# Patient Record
Sex: Female | Born: 1935 | Race: White | Hispanic: No
Health system: Southern US, Community
[De-identification: ages and names within clinical notes are randomized; demographics above are authoritative.]

## PROBLEM LIST (undated history)

## (undated) DIAGNOSIS — I509 Heart failure, unspecified: Secondary | ICD-10-CM

## (undated) DIAGNOSIS — F329 Major depressive disorder, single episode, unspecified: Secondary | ICD-10-CM

## (undated) DIAGNOSIS — N189 Chronic kidney disease, unspecified: Secondary | ICD-10-CM

## (undated) DIAGNOSIS — F32A Depression, unspecified: Secondary | ICD-10-CM

## (undated) DIAGNOSIS — M1990 Unspecified osteoarthritis, unspecified site: Secondary | ICD-10-CM

## (undated) DIAGNOSIS — I10 Essential (primary) hypertension: Secondary | ICD-10-CM

## (undated) DIAGNOSIS — E079 Disorder of thyroid, unspecified: Secondary | ICD-10-CM

## (undated) DIAGNOSIS — D649 Anemia, unspecified: Secondary | ICD-10-CM

## (undated) HISTORY — DX: Depression, unspecified: F32.A

## (undated) HISTORY — DX: Heart failure, unspecified: I50.9

## (undated) HISTORY — DX: Essential (primary) hypertension: I10

## (undated) HISTORY — DX: Anemia, unspecified: D64.9

## (undated) HISTORY — DX: Chronic kidney disease, unspecified: N18.9

## (undated) HISTORY — DX: Unspecified osteoarthritis, unspecified site: M19.90

## (undated) HISTORY — DX: Major depressive disorder, single episode, unspecified: F32.9

## (undated) HISTORY — DX: Disorder of thyroid, unspecified: E07.9

---

## 1998-01-11 ENCOUNTER — Ambulatory Visit (HOSPITAL_COMMUNITY): Admission: RE | Admit: 1998-01-11 | Discharge: 1998-01-11 | Payer: Self-pay | Admitting: Cardiology

## 2000-01-23 ENCOUNTER — Ambulatory Visit (HOSPITAL_COMMUNITY): Admission: RE | Admit: 2000-01-23 | Discharge: 2000-01-23 | Payer: Self-pay | Admitting: *Deleted

## 2000-05-07 ENCOUNTER — Encounter: Payer: Self-pay | Admitting: Cardiology

## 2000-05-07 ENCOUNTER — Ambulatory Visit (HOSPITAL_COMMUNITY): Admission: RE | Admit: 2000-05-07 | Discharge: 2000-05-07 | Payer: Self-pay | Admitting: Cardiology

## 2001-08-17 ENCOUNTER — Emergency Department (HOSPITAL_COMMUNITY): Admission: EM | Admit: 2001-08-17 | Discharge: 2001-08-17 | Payer: Self-pay | Admitting: Emergency Medicine

## 2001-08-17 ENCOUNTER — Encounter: Payer: Self-pay | Admitting: Emergency Medicine

## 2001-08-18 ENCOUNTER — Inpatient Hospital Stay (HOSPITAL_COMMUNITY): Admission: EM | Admit: 2001-08-18 | Discharge: 2001-08-27 | Payer: Self-pay | Admitting: Orthopedic Surgery

## 2001-08-18 ENCOUNTER — Encounter: Payer: Self-pay | Admitting: Orthopedic Surgery

## 2001-08-20 ENCOUNTER — Encounter: Payer: Self-pay | Admitting: Orthopedic Surgery

## 2001-08-23 ENCOUNTER — Encounter: Payer: Self-pay | Admitting: Orthopedic Surgery

## 2001-09-24 ENCOUNTER — Encounter: Admission: RE | Admit: 2001-09-24 | Discharge: 2001-09-24 | Payer: Self-pay | Admitting: Infectious Diseases

## 2004-10-17 ENCOUNTER — Inpatient Hospital Stay (HOSPITAL_COMMUNITY): Admission: AD | Admit: 2004-10-17 | Discharge: 2004-10-23 | Payer: Self-pay | Admitting: *Deleted

## 2004-12-24 ENCOUNTER — Ambulatory Visit: Payer: Self-pay | Admitting: Hematology & Oncology

## 2007-03-31 ENCOUNTER — Ambulatory Visit (HOSPITAL_COMMUNITY): Admission: RE | Admit: 2007-03-31 | Discharge: 2007-03-31 | Payer: Self-pay | Admitting: Orthopedic Surgery

## 2007-03-31 ENCOUNTER — Encounter (INDEPENDENT_AMBULATORY_CARE_PROVIDER_SITE_OTHER): Payer: Self-pay | Admitting: Orthopedic Surgery

## 2007-12-23 ENCOUNTER — Ambulatory Visit: Payer: Self-pay | Admitting: Hematology & Oncology

## 2008-01-12 ENCOUNTER — Encounter: Payer: Self-pay | Admitting: Hematology & Oncology

## 2008-01-12 ENCOUNTER — Other Ambulatory Visit: Admission: RE | Admit: 2008-01-12 | Discharge: 2008-01-12 | Payer: Self-pay | Admitting: Hematology & Oncology

## 2008-01-12 LAB — MANUAL DIFFERENTIAL
ALC: 1.6 10*3/uL (ref 0.9–3.3)
ANC (CHCC manual diff): 2.8 10*3/uL (ref 1.5–6.5)
Basophil: 0 % (ref 0–2)
Blasts: 0 % (ref 0–0)
Metamyelocytes: 0 % (ref 0–0)
Myelocytes: 0 % (ref 0–0)
PLT EST: ADEQUATE
RBC Comments: NORMAL
Variant Lymph: 0 % (ref 0–0)

## 2008-01-12 LAB — CBC WITH DIFFERENTIAL/PLATELET
MCHC: 33.7 g/dL (ref 32.0–36.0)
RBC: 4.85 10*6/uL (ref 3.70–5.32)
RDW: 16.6 % — ABNORMAL HIGH (ref 11.3–14.5)
WBC: 8.3 10*3/uL (ref 3.9–10.0)

## 2008-04-11 ENCOUNTER — Ambulatory Visit: Payer: Self-pay | Admitting: Hematology & Oncology

## 2008-04-12 LAB — CBC WITH DIFFERENTIAL (CANCER CENTER ONLY)
HCT: 38.9 % (ref 34.8–46.6)
MCH: 26.2 pg (ref 26.0–34.0)
MCV: 78 fL — ABNORMAL LOW (ref 81–101)
RBC: 5 10*6/uL (ref 3.70–5.32)
RDW: 15 % — ABNORMAL HIGH (ref 10.5–14.6)
WBC: 8.6 10*3/uL (ref 3.9–10.0)

## 2008-04-12 LAB — MANUAL DIFFERENTIAL (CHCC SATELLITE)
Eos: 2 % (ref 0–7)
MONO: 12 % (ref 0–13)
Other Cells: 32 % — ABNORMAL HIGH (ref 0–0)
PLT EST ~~LOC~~: ADEQUATE

## 2008-08-02 ENCOUNTER — Ambulatory Visit: Payer: Self-pay | Admitting: Hematology & Oncology

## 2008-08-03 LAB — CBC WITH DIFFERENTIAL (CANCER CENTER ONLY)
HCT: 39.3 % (ref 34.8–46.6)
HGB: 13.2 g/dL (ref 11.6–15.9)
MCH: 26.9 pg (ref 26.0–34.0)
MCHC: 33.5 g/dL (ref 32.0–36.0)
RBC: 4.89 10*6/uL (ref 3.70–5.32)

## 2008-08-03 LAB — MANUAL DIFFERENTIAL (CHCC SATELLITE)
ALC: 3.9 10*3/uL — ABNORMAL HIGH (ref 0.6–2.2)
BASO: 1 % (ref 0–2)
Eos: 7 % (ref 0–7)
PLT EST ~~LOC~~: DECREASED
Variant Lymph: 18 % — ABNORMAL HIGH (ref 0–0)

## 2008-08-03 LAB — CHCC SATELLITE - SMEAR

## 2008-10-27 ENCOUNTER — Emergency Department (HOSPITAL_COMMUNITY): Admission: EM | Admit: 2008-10-27 | Discharge: 2008-10-27 | Payer: Self-pay | Admitting: Emergency Medicine

## 2009-01-02 ENCOUNTER — Ambulatory Visit: Payer: Self-pay | Admitting: Hematology & Oncology

## 2009-01-04 LAB — MANUAL DIFFERENTIAL (CHCC SATELLITE)
ANC (CHCC HP manual diff): 3.7 10*3/uL (ref 1.5–6.7)
BASO: 1 % (ref 0–2)
Eos: 7 % (ref 0–7)
LYMPH: 30 % (ref 14–48)
MONO: 6 % (ref 0–13)
Other Cells: 19 % — ABNORMAL HIGH (ref 0–0)

## 2009-01-04 LAB — CBC WITH DIFFERENTIAL (CANCER CENTER ONLY)
HCT: 42.3 % (ref 34.8–46.6)
MCV: 81 fL (ref 81–101)
RBC: 5.23 10*6/uL (ref 3.70–5.32)
RDW: 14.7 % — ABNORMAL HIGH (ref 10.5–14.6)
WBC: 10 10*3/uL (ref 3.9–10.0)

## 2009-07-03 ENCOUNTER — Ambulatory Visit: Payer: Self-pay | Admitting: Hematology & Oncology

## 2009-07-05 LAB — MANUAL DIFFERENTIAL (CHCC SATELLITE)
BASO: 1 % (ref 0–2)
Eos: 2 % (ref 0–7)
LYMPH: 55 % — ABNORMAL HIGH (ref 14–48)
PLT EST ~~LOC~~: DECREASED

## 2009-07-05 LAB — CBC WITH DIFFERENTIAL (CANCER CENTER ONLY)
HGB: 13.5 g/dL (ref 11.6–15.9)
MCH: 26.9 pg (ref 26.0–34.0)
Platelets: 128 10*3/uL — ABNORMAL LOW (ref 145–400)
RBC: 5.02 10*6/uL (ref 3.70–5.32)
WBC: 11.1 10*3/uL — ABNORMAL HIGH (ref 3.9–10.0)

## 2009-07-05 LAB — CHCC SATELLITE - SMEAR

## 2009-12-17 ENCOUNTER — Ambulatory Visit: Payer: Self-pay | Admitting: Hematology & Oncology

## 2009-12-17 LAB — MANUAL DIFFERENTIAL (CHCC SATELLITE)
Eos: 6 % (ref 0–7)
MONO: 7 % (ref 0–13)
PLT EST ~~LOC~~: DECREASED
Platelet Morphology: NORMAL

## 2009-12-17 LAB — CBC WITH DIFFERENTIAL (CANCER CENTER ONLY)
HCT: 41.3 % (ref 34.8–46.6)
MCHC: 32.8 g/dL (ref 32.0–36.0)
MCV: 82 fL (ref 81–101)
RDW: 14 % (ref 10.5–14.6)

## 2010-06-25 ENCOUNTER — Ambulatory Visit: Payer: Self-pay | Admitting: Hematology & Oncology

## 2010-06-26 ENCOUNTER — Other Ambulatory Visit
Admission: RE | Admit: 2010-06-26 | Discharge: 2010-06-26 | Payer: Self-pay | Source: Home / Self Care | Admitting: Hematology & Oncology

## 2010-06-26 LAB — MANUAL DIFFERENTIAL (CHCC SATELLITE)
ANC (CHCC HP manual diff): 3 10*3/uL (ref 1.5–6.7)
MONO: 11 % (ref 0–13)
PLT EST ~~LOC~~: DECREASED

## 2010-06-26 LAB — CBC WITH DIFFERENTIAL (CANCER CENTER ONLY)
MCHC: 32.8 g/dL (ref 32.0–36.0)
RDW: 14 % (ref 10.5–14.6)

## 2010-07-02 LAB — FLOW CYTOMETRY - CHCC SATELLITE

## 2010-08-18 ENCOUNTER — Encounter: Payer: Self-pay | Admitting: Cardiology

## 2010-10-16 ENCOUNTER — Institutional Professional Consult (permissible substitution): Payer: Self-pay | Admitting: Pulmonary Disease

## 2010-10-16 ENCOUNTER — Observation Stay (HOSPITAL_COMMUNITY)
Admission: EM | Admit: 2010-10-16 | Discharge: 2010-10-19 | Disposition: A | Discharge status: Home Health | Payer: Medicare Other | Attending: Cardiology | Admitting: Cardiology

## 2010-10-16 ENCOUNTER — Emergency Department (HOSPITAL_COMMUNITY): Payer: Medicare Other

## 2010-10-16 DIAGNOSIS — I2789 Other specified pulmonary heart diseases: Secondary | ICD-10-CM | POA: Insufficient documentation

## 2010-10-16 DIAGNOSIS — Z7901 Long term (current) use of anticoagulants: Secondary | ICD-10-CM | POA: Insufficient documentation

## 2010-10-16 DIAGNOSIS — I5023 Acute on chronic systolic (congestive) heart failure: Principal | ICD-10-CM | POA: Insufficient documentation

## 2010-10-16 DIAGNOSIS — R0602 Shortness of breath: Secondary | ICD-10-CM | POA: Insufficient documentation

## 2010-10-16 DIAGNOSIS — I428 Other cardiomyopathies: Secondary | ICD-10-CM | POA: Insufficient documentation

## 2010-10-16 DIAGNOSIS — I4892 Unspecified atrial flutter: Secondary | ICD-10-CM | POA: Insufficient documentation

## 2010-10-16 LAB — CARDIAC PANEL(CRET KIN+CKTOT+MB+TROPI)
CK, MB: 1.1 ng/mL (ref 0.3–4.0)
Troponin I: 0.02 ng/mL (ref 0.00–0.06)

## 2010-10-16 LAB — BASIC METABOLIC PANEL
CO2: 19 mEq/L (ref 19–32)
Calcium: 8.7 mg/dL (ref 8.4–10.5)
Chloride: 108 mEq/L (ref 96–112)
Glucose, Bld: 111 mg/dL — ABNORMAL HIGH (ref 70–99)
Sodium: 137 mEq/L (ref 135–145)

## 2010-10-16 LAB — CBC
MCH: 25.3 pg — ABNORMAL LOW (ref 26.0–34.0)
Platelets: 102 10*3/uL — ABNORMAL LOW (ref 150–400)
RBC: 5.25 MIL/uL — ABNORMAL HIGH (ref 3.87–5.11)
WBC: 9.4 10*3/uL (ref 4.0–10.5)

## 2010-10-16 LAB — POCT CARDIAC MARKERS: Myoglobin, poc: 92.2 ng/mL (ref 12–200)

## 2010-10-16 LAB — BRAIN NATRIURETIC PEPTIDE: Pro B Natriuretic peptide (BNP): 594 pg/mL — ABNORMAL HIGH (ref 0.0–100.0)

## 2010-10-16 LAB — TROPONIN I: Troponin I: 0.02 ng/mL (ref 0.00–0.06)

## 2010-10-16 LAB — D-DIMER, QUANTITATIVE: D-Dimer, Quant: 0.68 ug/mL-FEU — ABNORMAL HIGH (ref 0.00–0.48)

## 2010-10-16 LAB — PROTIME-INR: Prothrombin Time: 43.3 seconds — ABNORMAL HIGH (ref 11.6–15.2)

## 2010-10-16 LAB — CK TOTAL AND CKMB (NOT AT ARMC): CK, MB: 1.3 ng/mL (ref 0.3–4.0)

## 2010-10-17 ENCOUNTER — Observation Stay (HOSPITAL_COMMUNITY): Payer: Medicare Other

## 2010-10-17 LAB — URINALYSIS, ROUTINE W REFLEX MICROSCOPIC
Glucose, UA: NEGATIVE mg/dL
Nitrite: NEGATIVE
Protein, ur: NEGATIVE mg/dL
pH: 5.5 (ref 5.0–8.0)

## 2010-10-17 LAB — BASIC METABOLIC PANEL
Calcium: 8.6 mg/dL (ref 8.4–10.5)
GFR calc Af Amer: 41 mL/min — ABNORMAL LOW (ref >60)
GFR calc non Af Amer: 34 mL/min — ABNORMAL LOW (ref >60)
Potassium: 4.4 mEq/L (ref 3.5–5.1)
Sodium: 147 mEq/L — ABNORMAL HIGH (ref 135–145)

## 2010-10-17 LAB — LIPID PANEL
Cholesterol: 112 mg/dL (ref 0–200)
HDL: 30 mg/dL — ABNORMAL LOW (ref >39)
Triglycerides: 81 mg/dL (ref <150)

## 2010-10-17 LAB — PROTIME-INR
INR: 4.64 — ABNORMAL HIGH (ref 0.00–1.49)
Prothrombin Time: 43.6 seconds — ABNORMAL HIGH (ref 11.6–15.2)

## 2010-10-17 LAB — URINE MICROSCOPIC-ADD ON

## 2010-10-17 LAB — CARDIAC PANEL(CRET KIN+CKTOT+MB+TROPI): Relative Index: 1.7 (ref 0.0–2.5)

## 2010-10-18 LAB — COMPREHENSIVE METABOLIC PANEL
AST: 16 U/L (ref 0–37)
Albumin: 3.5 g/dL (ref 3.5–5.2)
Alkaline Phosphatase: 37 U/L — ABNORMAL LOW (ref 39–117)
BUN: 27 mg/dL — ABNORMAL HIGH (ref 6–23)
GFR calc Af Amer: 44 mL/min — ABNORMAL LOW (ref >60)
Potassium: 4.6 mEq/L (ref 3.5–5.1)
Sodium: 138 mEq/L (ref 135–145)
Total Protein: 6 g/dL (ref 6.0–8.3)

## 2010-10-18 LAB — CBC
Hemoglobin: 12.3 g/dL (ref 12.0–15.0)
MCH: 25.3 pg — ABNORMAL LOW (ref 26.0–34.0)
MCHC: 31.7 g/dL (ref 30.0–36.0)
MCV: 79.7 fL (ref 78.0–100.0)
RBC: 4.87 MIL/uL (ref 3.87–5.11)

## 2010-10-18 LAB — PROTIME-INR
INR: 3.51 — ABNORMAL HIGH (ref 0.00–1.49)
Prothrombin Time: 35.2 seconds — ABNORMAL HIGH (ref 11.6–15.2)

## 2010-10-19 LAB — URINE CULTURE: Special Requests: NORMAL

## 2010-10-22 NOTE — Op Note (Signed)
  NAME:  SHELINA, LUO NO.:  [id number]  MEDICAL RECORD NO.:  [id number]           PATIENT TYPE:  O  LOCATION:  3737                         FACILITY:  [facility]  PHYSICIAN:  Ritta Slot, MD     DATE OF BIRTH:  1914/11/13  DATE OF PROCEDURE:  10/18/2010 DATE OF DISCHARGE:                              OPERATIVE REPORT   PROCEDURE:  Cardioversion.  INDICATIONS:  Ms. Ruble is very pleasant 77 year old lady who came with atrial flutter with mild CHF.  Her CHF has resolved, but she still remains in atrial flutter.  She was set up for DC cardioversion with me today.  DESCRIPTION OF PROCEDURE:  After informed consent, she received 60 mg of IV propofol by Dr. Noreene Larsson for general anesthetic.  I delivered one time synchronized biphasic shock of 120 joules that restored normal sinus rhythm straightaway.  PLAN:  We will continue current treatment.  We will let her go home tomorrow if she maintains normal sinus rhythm.     Ritta Slot, MD     HS/MEDQ  D:  10/18/2010  T:  10/19/2010  Job:  [id number]  Electronically Signed by Ritta Slot MD on 10/22/2010 02:43:28 PM

## 2010-10-23 NOTE — Discharge Summary (Signed)
NAME:  [name], [name] NO.:  [id number]  MEDICAL RECORD NO.:  [id number]           PATIENT TYPE:  O  LOCATION:  3737                         FACILITY:  [facility]  PHYSICIAN:  Landry Corporal, MD DATE OF BIRTH:  27-May-1915  DATE OF ADMISSION:  10/16/2010 DATE OF DISCHARGE:  10/19/2010                              DISCHARGE SUMMARY   DISCHARGE DIAGNOSES: 1. Acute-on-chronic systolic heart failure. 2. Nonischemic cardiomyopathy ejection fraction 35%. 3. Atrial flutter rate controlled but poorly tolerated by the patient     undergoing direct current cardioversion on October 18, 2010, that was     successful to sinus rhythm. 4. Abnormal D-dimer.  We did not do CT angio as INR has been greater     than 3 and on this admission greater than 4 and we are continuing     her Coumadin. 5. Hyper-anticoagulation, we have held Coumadin for several days and     we will restart 2 mg on October 20, 2010. 6. Moderate pulmonary hypertension.  DISCHARGE CONDITION:  Improved.  PROCEDURES:  DC cardioversion September 19, 2010 by Dr. Ritta Slot, please see report.  DISCHARGE INSTRUCTIONS: 1. The patient will follow up with Dr. Alanda Amass on October 25, 2010, at     11:15 a.m. 2. Increase activity slowly. 3. Low-sodium heart-healthy diet and she will also go to [facility] on Monday for PT/INR as she has been adjustments     made here in the hospital.  DISCHARGE MEDICATIONS:  The medication reconciliation sheet from [facility]. The medicines have stayed the same except her dose of Coumadin has been decreased 2 mg daily which will begin on October 20, 2010.  We also discontinued her sotalol after discussion in looking at her renal function studies.  HISTORY OF PRESENT ILLNESS:  Ms. Libman is a 77 year old female who was seen by Dr. Alanda Amass on October 15, 2010, secondary to episodic shortness of breath and dyspnea on exertion.  She was also waking up several  times at night with PMD.  She was found to be in A-flutter which she had had remotely and she at that time had reaction to amiodarone as well as Cardizem with rashes with both of those medicines.  She has also undergone a cardioversion by Dr. Severiano Gilbert on March 2006.  Also remote history in 1999 of pulmonary embolus.  She had a 2-D echo with a EF of 35% with atrial fib and flutter with moderately controlled ventricular rate, had moderate aortic calcification with no AS and no AI and mild-to- moderate MR.  She had an A-flutter with right bundle branch block. Coumadin was continued and the patient was placed on sotalol. Unfortunately, she continued overnight after the visit with continued shortness of breath and chest pressure and came to the emergency room at [facility], then she was having a sleep study now.  She was given IV Lasix, then admitted to telemetry unit.  Her BNP on admission was 594, also her INR was 4.64, creatinine was elevated at 1.45 the day after her D-dimer was slightly up at 0.68.  For the next day, her cardiac  enzymes were all negative.  Her troponins were 0.01 and 0.02.  TSH was normal at 2.78, hemoglobin A1c was 6.  When she was seen by Dr. Rennis Golden, he had a long discussion with the patient and her daughter and they decided to discontinue her sotalol in the face of mild renal insufficiency and due to her class II to III heart failure at this time Multaq was not an option.  Plan would be for cardioversion.  Unfortunately, we were not able to accomplish that on October 17, 2010, due to scheduling.  It was rescheduled for October 18, 2010.  She had one episode on October 17, 2010, where she could not catch her breath and her sats were normal during that time.  She had little chest discomfort during that period too. EKG was done and no changes.  By the next day, she was stable without complaints, still with 1+ ankle edema and was n.p.o. and underwent cardioversion and converted to sinus  rhythm.  By October 19, 2010, INR was down to 3.54.  She was stable, had no complaints, mild shortness of breath with exertion.  Dr. Allyson Sabal saw her and had long discussion with the patient and her daughter.  She may need a higher dose of Lasix.  Dr. Alanda Amass will see her in a week's time.  We will have her INR done in our office.  If she continues shortness of breath, she may need to see Dr. Alanda Amass sooner rather than waiting the whole week.  Please note on the day of discharge, the patient's lab was drawn.  The tourniquet for blood draws was inadvertently left in place for approximately an hour.  It was then removed.  The patient was not aware was in place.  There was a red mark on the arm that lightened up and was minimally present at the time of discharge and the arm per the nurse was pink.  LABORATORY DATA:  Hemoglobin prior to discharge was 12.3 and hematocrit of 38.8, platelets of 100, WBC 8.2.  INR was 3.54 and the pro time was 35.4.  D-dimer as stated was 0.68.  Sodium 138, potassium 4.6, chloride 106, CO2 26, glucose 119, BUN 27, creatinine 1.35, GFR is 37.  Total bili 0.7, alkaline phos 37, SGOT 16, SGPT 12, total protein 6, albumin 3.5, calcium 8.7.  Hemoglobin A1c was 6.  Cardiac enzymes were negative with CKs ranging 38 to 107, MB 1.3 to 1.8, troponin I was negative at 0.01 to 0.02.  Total cholesterol 112, triglycerides 81, HDL 30, LDL 66.  TSH 2.788.  UA small amount of bilirubin, urobilinogen 0.2 and leukocytes trace.  RADIOLOGY:  Chest x-ray cardiomegaly, no frank congestive heart failure or active disease and one view changes of COPD and followup PA and lateral small bilateral pleural effusions, cardiomegaly.  Osteopenia and exaggerated thoracic kyphosis.  Please note also on admission her weight was 83.46, and at discharge she was 80.3.  EKGs initially A-flutter with tachycardia up to 112 beats per minute and at discharge she was in sinus rhythm with occasional  PVCs, rate of 67.  The patient will follow up as instructed.  Please note she was returning to a [facility] independent living.  Her daughter took her to that location.     Darcella Gasman. Annie Paras, N.P.   ______________________________ Landry Corporal, MD    LRI/MEDQ  D:  10/19/2010  T:  10/20/2010  Job:  [id number]  cc:   Gerlene Burdock A. Alanda Amass, M.D. Thayer Headings, M.D.  Dr. Konrad Felix R. Myna Hidalgo, M.D.  Electronically Signed by Nada Boozer N.P. on 10/22/2010 11:40:31 AM Electronically Signed by Bryan Lemma MD on 10/23/2010 11:26:24 PM MedRecNo: [id number] [facility], Account: [id number], DocSeq: [id number] I was the admitting attending for this patient, but was not present in the hospital for the remainder of her hospital course.  Echocardiographic evaluation noted NICM, so the sotalol was discontinued.  She was diuresed, and underwent successful  Synchronized Direct Current Cardioversion.  She was seen on the day of discharge by my partner, Dr. Allyson Sabal who felt that she was stable for discharge with f/u planned with Dr. Alanda Amass.   Electronically Signed by Bryan Lemma MD on 10/23/2010 11:26:15 PM

## 2010-11-06 LAB — PROTIME-INR: Prothrombin Time: 23.7 seconds — ABNORMAL HIGH (ref 11.6–15.2)

## 2010-11-21 ENCOUNTER — Ambulatory Visit
Admission: RE | Admit: 2010-11-21 | Discharge: 2010-11-21 | Disposition: A | Discharge status: Home / Self Care | Payer: Medicare Other | Source: Ambulatory Visit | Attending: Internal Medicine | Admitting: Internal Medicine

## 2010-11-21 ENCOUNTER — Other Ambulatory Visit (HOSPITAL_COMMUNITY): Payer: Medicare Other

## 2010-11-21 ENCOUNTER — Other Ambulatory Visit: Payer: Self-pay | Admitting: Internal Medicine

## 2010-11-21 DIAGNOSIS — R1110 Vomiting, unspecified: Secondary | ICD-10-CM

## 2010-11-21 DIAGNOSIS — R1032 Left lower quadrant pain: Secondary | ICD-10-CM

## 2010-12-10 NOTE — Op Note (Signed)
NAME:  GIAH, FICKETT NO.:  [id number]   MEDICAL RECORD NO.:  [id number]          PATIENT TYPE:  AMB   LOCATION:  DAY                          FACILITY:  [facility]   PHYSICIAN:  Georges Lynch. Gioffre, M.D.DATE OF BIRTH:  [birth year]   DATE OF PROCEDURE:  03/31/2007  DATE OF DISCHARGE:                               OPERATIVE REPORT   SURGEON:  Georges Lynch. Darrelyn Hillock, M.D.   ASSISTANT:  Arlyn Leak PA.   PREOPERATIVE DIAGNOSES:  1. A tear of the rotator cuff tendon on the left.  2. Severe acromioclavicular joint arthritis.  3. Large mass of the left AC joint.   POSTOPERATIVE DIAGNOSES:  1. A tear of the rotator cuff tendon on the left.  2. Severe acromioclavicular joint arthritis.  3. Large mass of the left AC joint which turned out be a large      ganglion cyst.   PROCEDURE:  The patient first had the appropriate antibiotics IV.  She  was taken back to surgery.  Sterile prep and drape of the left shoulder  was carried out.  At this time an incision was made directly over the  mass of the left shoulder.  At this time, immediately upon going down to  the mass, there was a large ganglion cyst that originated actually from  the [place] joint.  We went down and totally removed the cyst and sent the  cyst off for pathology.  Following that, I went down and resected the  distal clavicle and went down further over the rotator cuff.  There was  a small hole in the cuff that I repaired with #1 Ethibond suture.  I  thoroughly irrigated out the area.  I bone waxed the end of the resected  clavicle.  I then inserted thrombin-soaked Gelfoam into the wound site  and closed the wound in layers in usual fashion.  Note, the distal  clavicle as well as the cyst was sent to the pathology lab for analysis.  Sterile dressings were applied.  The patient was placed in a sling.           ______________________________  Georges Lynch. Darrelyn Hillock, M.D.     RAG/MEDQ  D:  03/31/2007  T:  03/31/2007  Job:   [id number]

## 2010-12-13 NOTE — Discharge Summary (Signed)
[facility]  Patient:    [name], [name] Visit Number: [id number] MRN: [id number]          Service Type: SUR Location: 4W 0466 01 Attending Physician:  Skip Mayer Dictated by:   Marcie Bal Troncale, P.A.C. Admit Date:  08/18/2001 Disc. Date: 08/27/01   CC:         Fransisco Hertz, M.D.  Jaclyn Prime. Lucas Mallow, M.D.   Discharge Summary  PRIMARY DIAGNOSIS:  Infected left total knee arthroplasty.  SECONDARY DIAGNOSES:  1. History of pulmonary embolism.  2. Left total knee replacement in 1989.  3. Right total knee replacement in 1994.  SURGICAL PROCEDURES:  1. Irrigation and debridement, left total knee, by Dr. Darrelyn Hillock on August 19, 2001.  2. Repeat irrigation, left total knee, by Dr. Darrelyn Hillock on August 21, 2001.  CONSULTATIONS:  Dr. Lucas Mallow in medicine and Dr. Maurice March in infectious diseases.  LABORATORY DATA:  Preoperative H&H were 11.4 and 33.9 respectively.  This reached a low on January 29 of 10.4 and 30.4 respectively.  On admission, her INR was 2.8.  Prior to discharge, INR was 3.4.  Routine chemistry on admission was all within normal limits with the exception of slightly elevated glucose of 122.  BUN and creatinine were 22 and 0.9 respectively.  Sodium and potassium were 139 and 3.5 respectively.  Urinalysis on January 23 showed trace leukocyte esterase, negative nitrite, with a few epithelial cells and 11 to 20 white blood cells.  Gram stain on January 23 from the left knee showed rare gram-positive cocci and abundant white blood cells.  Cultures grew out rate Staphylococcus aureus sensitive to all but to penicillin.  Chest x-ray on January 24 showed cardiomegaly with atelectasis particularly in the left base. Repeat on January 27 showed little interval change with atelectasis and a small pleural effusion felt to be present on the left.  EKG from August 18, 2001, showed normal sinus rhythm with occasional PVCs and left axis  deviation as well as right bundle branch block.  CHIEF COMPLAINT:  Left knee pain.  HISTORY OF PRESENT ILLNESS:  [name] is an 77 year old female who had undergone a left total knee arthroplasty in 1989.  She has done fairly well since that time until this past week she began developing increasing pain and swelling to the left knee.  She also had pain to the point that she could not bear weight on the knee.  She developed a temperature up to 101 at home.  She was seen by Dr. Darrelyn Hillock and it was felt she may have an infected left total knee.  She was admitted for surgical intervention and antibiotic initiation.  HOSPITAL COURSE:  Following the surgical procedure, patient was taken to the PACU in stable condition and transferred to the orthopedic floor in good condition.  Cultures were followed and Dr. Maurice March in the infectious disease was consulted.  Rare Staphylococcus was identified and appropriate antibiotics were initiated.  She was started on vancomycin as well as oral rifampin.  She was placed on Lovenox and her Coumadin was stopped in the interval in anticipation of repeat surgery.  She was taken back to surgery on January 25 for a repeat irrigation of that left total knee.  Dr. Lucas Mallow was also consulted for management of the patients elevated blood pressure.  A PICC line was placed on August 23, 2001, without complication or difficulty.  Patient was up with physical therapy full weightbearing and also started on  CPM machine. Knee continued to improve and she remained afebrile for three consecutive days.  It was Dr. Betti Cruz recommendation that the patient complete a four-week course of vancomycin IV q.48h. with p.o. rifampin 300 b.i.d. and then switch over to oral quinoline with the oral rifampin.  By August 27, 2001, patient was orthopedically and medically stable and ready for discharge.  DISPOSITION:  She is being discharged to [facility] skilled nursing facility.  Diet  - low sodium.  Activity - weightbearing as tolerated.  PICC line care to the right arm.  Her blood pressures will be followed closely and results forwarded to Dr. Lucas Mallow.  She is to follow up with Dr. Lucas Mallow as instructed.  Follow up with Dr. Darrelyn Hillock as scheduled next week.  DISCHARGE MEDICATIONS:  1. Colace 100 mg p.o. b.i.d.  2. Multivitamin one p.o. q.d.  3. Albuterol inhaler one puff q.6h. p.r.n.  4. Atrovent 0.5 mg inhaler one puff q.6h.  5. Rifampin 300 mg p.o. b.i.d. initially for a period of four weeks and then     continued per Dr. Betti Cruz instructions.  6. Vancomycin IV q.48h. per pharmacy dosing for a period of four weeks.  7. Norvasc 5 mg p.o. q.d.  8. Altace 5 mg p.o. q.d.  9. Hydrochlorothiazide 25 mg p.o. q.d. 10. Coumadin per pharmacy dosing. 11. Robaxin 500 mg p.o. q.8h. p.r.n. spasm. 12. Percocet 5/325 one or two every 4 to 6 hours as needed for pain. 13. Phenergan 25 mg p.o. or p.r. q.4-6h. p.r.n. nausea or vomiting.  CONDITION ON DISCHARGE:  Good and improved. Dictated by:   Marcie Bal Troncale, P.A.C. Attending Physician:  Skip Mayer DD:  08/27/01 TD:  08/27/01 Job: [id number]

## 2010-12-13 NOTE — Procedures (Signed)
[facility]  Patient:    [name], [name]                   MRN: [id number] Adm. Date:  [id number] Attending:  Sabino Gasser                           Procedure Report  PROCEDURE:  Colonoscopy.  ENDOSCOPIST:  Sabino Gasser, M.D.  ANESTHESIA:  Demerol 75 mg, Versed 7 mg.  INDICATIONS:  Rectal bleeding.  DESCRIPTION OF PROCEDURE:  With patient mildly sedated in the left lateral decubitus position, the Olympus videoscopic colonoscope was inserted in the rectum and passed through a very tortuous ______ -filled sigmoid colon and ultimately we reached the cecum; cecum identified by ileocecal valve and appendiceal orifice, both of which were photographed.  From this point, the colonoscope was slowly withdrawn, taking circumferential views of the entire colonic mucosa, stopping to photograph diverticula seen along the way throughout the colon, more so on the left side than the right.  This was done till we reached the rectum, which appeared normal on direct view and showed internal hemorrhoids on retroflexed view, and hemorrhoids seen on pull through the anal canal, photographs taken.  Patients vital signs and pulse oximetry remained stable.  Patient tolerated the procedure well without apparent complications.  FINDINGS:  Internal hemorrhoids and diverticulosis throughout colon, more so in the sigmoid, but also in the right colon; otherwise, unremarkable examination.  PLAN:  Follow up with me as needed. DD:  01/23/00 TD:  01/24/00 Job: [id number]

## 2010-12-13 NOTE — Discharge Summary (Signed)
NAME:  [name], GOODLOW NO.:  [id number]   MEDICAL RECORD NO.:  [id number]          PATIENT TYPE:  INP   LOCATION:  5532                         FACILITY:  [facility]   PHYSICIAN:  Mallory Shirk, MD     DATE OF BIRTH:  1915/06/09   DATE OF ADMISSION:  10/17/2004  DATE OF DISCHARGE:                                 DISCHARGE SUMMARY   DISCHARGE DIAGNOSES:  1.  Aflutter with rapid ventricular response status post cardioversion to      normal sinus rhythm.  2.  Rash, likely secondary to Diltiazem.   DISCHARGE MEDICATIONS:  1.  Amiodarone 400 mg p.o. daily x7 days, then 200 mg p.o. daily continuous.  2.  Prednisone taper 40 mg p.o. daily x3 days, 30 mg p.o. daily x3 days, 20      mg p.o. daily x3 days, 10 mg p.o. daily x3 days then discontinue.  3.  Triaminolone 0.1% topical to be applied to affected area t.i.d.  4.  Coumadin 2 mg p.o. daily x3 days on October 26, 2004, and PT/INR will be      checked, dose to be adjusted.  5.  Atarax 50 mg p.o. q.6h. p.r.n. itching.  6.  Tylenol 650 mg p.o. q.4-6h. p.r.n. pain.   FOLLOWUP:  1.  With Dr. Aggie Cosier, primary care physician, on October 26, 2004, at which      time a PT/INR will be checked.  Further adjustment of Coumadin dose and      prescriptions will be done by Dr. Lucas Mallow.  2.  With Dr. Donzetta Starch, Dermatology, on November 18, 2004, at 9:40 a.m.   HISTORY OF PRESENT ILLNESS:  Ms. [name] is a very pleasant 77 year old  Caucasian woman who was sent from Dr. Pollie Friar clinic.  The patient was seen  in the clinic for a rash that she had starting 4-5 days prior to admission.  The patient was diagnosed with atrial flutter and rapid ventricular response  the middle of February 2006 and started with Cardizem for rate control.  The  patient had her Cardizem titrated up from 240-360 mg and subsequently  developed rash on her feet that was ascending, and she also had rash on her  chest and trunk that was intensely pleuritic.  She had  also been recently  started on Lasix.  The patient stopped Lasix and tried Demadex after seeing  Dr Pollie Friar staff.  However, she continued to have the rash, and the patient  was sent to the hospital for evaluation of rash and management of her atrial  flutter with rapid ventricular response.  Of note, the patient is intolerant  of pain blockers.   PAST MEDICAL HISTORY:  1.  Atrial flutter with rapid ventricular response diagnosed in February      2006.  2.  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  3.  Status post bilateral knee arthropathy.  4.  Arthroplasty in 1999 and 1994.  5.  Tonsillectomy.  6.  Rotator cuff repair.   MEDICATIONS ON ADMISSION:  1.  Coumadin 3 mg four times a week, 3.5 mg three times  a week.  2.  Tylenol p.r.n.  3.  Vitamin C 500 mg p.o. daily.  4.  Vitamin E 40 mg p.o. daily.  5.  Zinc 50 mg sporadically.  6.  Beta keratin 2500 units daily.  7.  Ocuvite and luteum one daily.  8.  Demadex 20 mg daily.  9.  Atarax 25 mg one half tablet p.r.n.   ALLERGIES:  DILTIAZEM, AMOXICILLIN, __________DYE.   PHYSICAL EXAMINATION:  VITAL SIGNS:  Blood pressure 123/69, heart rate 110-  120, saturation 96% on room air.  HEENT:  Normocephalic, atraumatic.  PERRL.  Sclerae anicteric.  Mucous  membranes moist.  NECK:  Supple.  No LAD.  No JVD.  CARDIOVASCULAR:  Irregularly irregular, tachycardic.  ABDOMEN:  Soft, positive bowel sounds.  No tenderness.  No masses.  LUNGS:  Clear to auscultation bilaterally.  No wheezes.  No rales.  EXTREMITIES:  Pitting edema 1+ bilaterally, lower extremities.  SKIN:  Maculopapular rash on the lower extremities, on the trunk and on the  back.  NEUROLOGICAL:  Nonfocal.   HOSPITAL COURSE:  #1 - ATRIAL FIBRILLATION/FLUTTER WITH RAPID VENTRICULAR  RESPONSE.  The patient was admitted to __________ bed #1, atrial  fibrillation/flutter with rapid ventricular response.   The patient was started on Amiodarone 400 mg p.o. daily.  After three  days,  she had not converted.  She was seen by Launa Grill of [facility].  The patient underwent a cardioversion with conscious sedation.  The patient converted to normal sinus rhythm.  She continued to stay on  normal sinus rhythm.  On day of discharge, she was in sinus rhythm with rate  in the 70's.  Blood pressure was 134/63.  She will be continued with  Amiodarone at 400 mg p.o. daily until October 31, 2004.  Subsequently, she will  be started on Amiodarone 200 mg p.o. daily continuously.   #2 - MACULOPAPULAR RASH.  Maculopapular rash, likely secondary to Diltiazem.  The Diltiazem has been discontinued, so has the Lasix.  The patient was seen  by Dr. Donzetta Starch from Dermatology.  Recommendation was Triamcinolone  topical t.i.d.  the patient was initially started on Solu-Medrol and  subsequently switched to p.o. prednisone.  She will be discharged on a  prednisone taper.   #3 - HISTORY OF DEEP VEIN THROMBOSIS.  The patient was on Coumadin prior to  admission.  Her Coumadin will be continued.  Her INR was 2.2 on the day of  discharge with a Coumadin of 2 mg.  The patient will be discharged on 2 mg  of Coumadin for the next three days when on October 26, 2004, Dr. Aggie Cosier  will check a PT/INR and adjust the Coumadin dose accordingly.   PLAN:  The patient was advised call for follow up appointments.  She was  also advised to take her medications as recommended.  The patient's doctor  was in her room, and they understood the instructions.  She was also advised  to return to the emergency department immediately upon onset of chest pain,  shortness of breath, palpitations or any other symptoms that might need  immediate medical attention.      GDK/MEDQ  D:  10/23/2004  T:  10/23/2004  Job:  [id number]   cc:   Jaclyn Prime. Lucas Mallow, M.D.  [address]  [place]  [place] [address]  Fax: [phone]   Janeece Riggers. Severiano Gilbert, M.D. Fax: [phone]   Rocco Serene, M.D.  [address] [place]  [address] [place]  [place] [address]  Fax: [phone]

## 2010-12-13 NOTE — Op Note (Signed)
[facility]  Patient:    [name], [name] Visit Number: [id number] MRN: [id number]          Service Type: SUR Location: 4W 0466 01 Attending Physician:  Skip Mayer Dictated by:   Georges Lynch Darrelyn Hillock, M.D. Proc. Date: 08/21/01 Admit Date:  08/18/2001 Discharge Date: 08/27/2001                             Operative Report  HISTORY:  This is operation #2. I took her to surgery two days ago and did an irrigation of her left total knee. She was brought back for a second procedure today.  PREOPERATIVE DIAGNOSIS:  Infection in a 77 year old total knee arthroplasty on the left.  NOTE:  She had no previous problems with this knee in 14 years. She had a recent outbreak of vomiting and diarrhea from a nursing home.  OPERATION:  Irrigation of left total knee.  DESCRIPTION OF PROCEDURE:  Under general anesthesia, I removed her old drain, did a regular prep and then a sterile prep and then two cannulas were inserted in her knee, one for inflow one for outflow. I then irrigated the knee out with 3000 cc of saline solution. When I initially inserted the cannula, there was no purulent material. We just had clear fluid. We thoroughly irrigated out the knee. The knee fluid remained clear throughout the entire procedure. I did take another repeat culture at the beginning of the procedure before the irrigation was started. The cultures were sent for aerobic and anaerobic and they will dated August 21, 2001. Once the procedure was complete, I inserted a Jackson-Pratt drain into her knee. I then reapplied new dressings to her knee and she returned to the recovery room. At this point, she is on Rifampin by mouth t.i.d. and she is also on the vancomycin protocol. Dictated by:   Georges Lynch Darrelyn Hillock, M.D. Attending Physician:  Skip Mayer DD:  08/21/01 TD:  08/22/01 Job: [id number]

## 2010-12-13 NOTE — Cardiovascular Report (Signed)
NAME:  [name], [name] NO.:  [id number]   MEDICAL RECORD NO.:  [id number]          PATIENT TYPE:  INP   LOCATION:  5532                         FACILITY:  [facility]   PHYSICIAN:  Janeece Riggers. Severiano Gilbert, M.D.    DATE OF BIRTH:  07-Mar-1915   DATE OF PROCEDURE:  10/22/2004  DATE OF DISCHARGE:                              CARDIAC CATHETERIZATION   PROCEDURE PERFORMED:  External direct current cardioversion.   PREPROCEDURE DIAGNOSIS:  Atrial flutter, 2:1 block.   POSTOPROCEDURE DIAGNOSIS:  Sinus rhythm.   OPERATOR:  Mark E. Severiano Gilbert, M.D.   COMPLICATIONS:  None.   ESTIMATED BLOOD LOSS:  Less than 30 mL.   ANESTHESIA:  3 g of Versed and 25 mcg of fentanyl.   PROCEDURE IN DETAIL:  The patient was brought in a fasted state to the EP  laboratory.  The patient was prepped and draped in the usual manner and  connected to an external defibrillator.  Conscious sedation was obtained to  an appropriate level using intermittent injections of midazolam and  fentanyl.  In a synchronized mode, a single 100-joule cardioverting shock  was delivered, which resulted in resumption of sinus rhythm.  The patient  rapidly awoke from conscious sedation with a nonfocal motor sensory exam.  Her speech and mental status were also appropriate.  At the time of  discharge from the EP laboratory, she had been in sinus rhythm for  approximately 15 minutes.      MEP/MEDQ  D:  10/22/2004  T:  10/22/2004  Job:

## 2010-12-13 NOTE — H&P (Signed)
[facility]  Patient:    [name], [name] Visit Number: [id number] MRN: [id number]          Service Type: Attending:  Georges Lynch. Darrelyn Hillock, M.D. Dictated by:   Ottie Glazier. Wynona Neat, P.A.-C. Adm. Date:  08/19/01                           History and Physical  DATE OF BIRTH:  02/11/1915  SSN:  [id number]  CHIEF COMPLAINT:  Left knee pain.  HISTORY OF PRESENT ILLNESS:  Ms. [name] is an 77 year old white female, who is status post a left total knee arthroplasty in 1989.  The patient states that she has not had any problems since that time; however, since this past Sunday, she has noticed an increased amount of pain in the left knee which has progressed now to a severity in which she cannot bear weight on the left knee. Subsequently she has also had GI upset as well as general malaise during this period of time as well.  She states she has had a T-max of 101 at home.  ALLERGIES:  She is allergic to AMOXICILLIN.  MEDICATIONS: 1. She is on Coumadin 5 mg Tuesdays, Thursdays, Saturdays, and Sundays, and    then 2.5 mg Mondays, Wednesdays, and Fridays. 2. Ocuvite one daily. 3. [employer] 4. Tylenol ES for pain.  PAST MEDICAL HISTORY: 1. She has a history of pulmonary embolism. 2. Left knee replacement in 1989. 3. Right knee replacement in 1994.  PAST SURGICAL HISTORY: 1. History of bilateral TKA as mentioned above. 2. Tonsillectomy. 3. Open right rotator cuff repair. 4. Possible ORIF of left elbow. 5. Toe surgery per patient.  SOCIAL HISTORY:  She is widowed.  She denies any tobacco use.  She has an occasional glass of wine.  She was raised in her friends home place.  FAMILY HISTORY:  Mother and father noncontributory.  Brothers and sisters have a history of coronary artery disease.  REVIEW OF SYSTEMS:  In general the patient states she has had fevers and chills.  Recently she denies any recent weight loss or decrease in  appetite. HEENT:  She wears corrective lenses, denies any chronic headaches, seeing spots or specks, ringing of the ears, runny nose, sore throat.  CHEST:  Denies any chronic or productive cough, hemoptysis.  CARDIOVASCULAR:  Denies any irregular heartbeat, syncopal episodes with chest pain, or shortness of breath.  GASTROINTESTINAL/GENITOURINARY:  Denies any chronic diarrhea, constipation, melena, bright red stools per rectum.  No dysuria, frequency, urgency, nocturia.  EXTREMITIES:  Please see HPI.  NEUROLOGIC:  Denies any history of seizures, strokes.  PHYSICAL EXAMINATION:  VITAL SIGNS:  Blood pressure is 110/62, pulse is 82, respirations 16.  Her temperature is 96.8 this morning.  GENERAL:  In the office in general she is a very pleasant 77 year old white female, appears uncomfortable, however.  HEENT:  She does have glasses.  Her TMs are negative.  No erythema, no bulging noted.  Negative for any erythema or tonsillar hypertrophy.  NECK:  Supple without masses or carotid bruits.  CHEST:  Clear to auscultation bilaterally.  HEART:  Regular rate and rhythm, S1, S2.  ABDOMEN:  Soft, nontender.  Bowel sounds are positive x4 quadrants, no guarding, no rebound.  EXTREMITIES:  Left knee is swollen, warm to touch.  Pain is less with any range of motion.  There is no diffuse erythema noted.  She has positive pedal pulses and  no calf tenderness.  LABORATORY/X-RAYS:  Pending.  IMPRESSION:  Possible infected left TKA.  PLAN:  The patient will be admitted to [facility].  She will undergo arthroscopic I&D per Dr. Darrelyn Hillock.  She will also be admitted for IV antibiotics.  Operative intervention will be pending her PT and INR.  When they are at the appropriate levels, then we will proceed with her I&D. Dictated by:   Ottie Glazier. Wynona Neat, P.A.-C. Attending:  Georges Lynch. Darrelyn Hillock, M.D. DD:  08/19/01 TD:  08/19/01 Job: [id number]

## 2010-12-13 NOTE — Op Note (Signed)
[facility]  Patient:    [name], [name] Visit Number: [id number] MRN: [id number]          Service Type: SUR Location: 4W 0466 01 Attending Physician:  Skip Mayer Dictated by:   Georges Lynch Darrelyn Hillock, M.D. Proc. Date: 08/19/01 Admit Date:  08/18/2001                             Operative Report  SURGEON:  Windy Fast A. Darrelyn Hillock, M.D.  ASSISTANT:  Nurse.  PREOPERATIVE INFORMATION:  She had a left total knee arthroplasty done 14 years ago.  She has done extremely well, never had a complication postop, never had an infection.  About three days ago she developed acute nausea and vomiting and diarrhea.  She lives in [facility], and she said that there was "an outbreak of this problem."  She also developed a urinary tract infection. She came into the emergency room complaining of sudden onset of pain, swelling of the left knee.  She was seen by my associate, Dr. Lestine Box, and the knee was aspirated, and cultures and sensitivities were sent.  We were going to take her to surgery that day, but she has been on Coumadin, so I elected to see her in the office the next morning and admit her to the hospital, which we did, and we finally had to give her fresh frozen plasma to get her PT and PTT and INR under control.  PREOPERATIVE DIAGNOSIS:  Questionable infection of the left total knee secondary to either her gastrointestinal tract or the urinary tract.  POSTOPERATIVE DIAGNOSIS:  We are awaiting her cultures.  OPERATION: 1. Incision and drainage of her left knee. 2. Insertion of a Jackson-Pratt drain, left knee.  DESCRIPTION OF PROCEDURE:  Under general anesthesia, a routine orthopedic prep and drape of the left knee was carried.  At this time, a small punctate incision made in the suprapatellar pouch.  Inflow cannula was inserted. Immediately upon insertion of the cannula, purulent material came out of the knee.  Cultures were sent for aerobic  and anaerobic and Gram stain.  Following this, another cannula was inserted for the medial approach, and I then utilized irrigation system with two bags of irrigation fluid and thoroughly irrigated her knee out until the fluid remained clear for the last 10 minutes. We thoroughly flexed and extended the knee, compressed the knee while we were doing the irrigation.  I then inserted the Jackson-Pratt drain and dressed with knee with the idea that we will bring her back to surgery Saturday morning for another irrigation. Dictated by:   Georges Lynch Darrelyn Hillock, M.D. Attending Physician:  Skip Mayer DD:  08/19/01 TD:  08/20/01 Job: [id number]

## 2010-12-13 NOTE — H&P (Signed)
NAME:  [name], SLAPPEY NO.:  [id number]   MEDICAL RECORD NO.:  [id number]          PATIENT TYPE:  INP   LOCATION:  5532                         FACILITY:  [facility]   PHYSICIAN:  Hettie Holstein, D.O.    DATE OF BIRTH:  [birth year]   DATE OF ADMISSION:  10/17/2004  DATE OF DISCHARGE:                                HISTORY & PHYSICAL   PRIMARY CARE PHYSICIAN:  Jaclyn Prime. Lucas Mallow, M.D.   CHIEF COMPLAINT:  Rash.   HISTORY OF PRESENT ILLNESS:  This is a pleasant 77 year old female, patient  of Dr. Jaclyn Prime. Lucas Mallow, who was seen in his clinic today after she had been  having a progressive rash since Saturday. She had been diagnosed with atrial  flutter and rapid ventricular response in the middle of February and  subsequently was initiated on Cardizem for rate control and due to poor  control of rate she had undergone up titration of her Cardizem from 240 to  360 and subsequently developed rash on her feet that was ascending and also  development of rash on her chest and trunk that is intensely pruritic. She  does not recall any other medications with the exception of Lasix that she  had been started on. She stopped this medication and tried Demodex after  seeing Dr. Jaclyn Prime. Grove's staff on Monday and continued to have aggressive  rash. She was also noted to be in continued atrial flutter with rapid  ventricular response. Dr. Jaclyn Prime. Lucas Mallow has noted that she was intolerant  to beta blocker in addition, and at this time, we are going to be starting  her on amiodarone due to her intolerance of both Cardizem and Lopressor.   PAST MEDICAL HISTORY:  1.  Significant for recent diagnosis of atrial flutter with rapid      ventricular response. She underwent 2-D echocardiogram as recent as      March 17, 2002 that revealed normal LV function of 55-65% and some      moderate tricuspid insufficiency.  2.  She has a history of venous thromboembolism in the past.  3.  In addition, she  has a history of bilateral knee arthroplasty back in      1989 and 1994 with complicating methicillin-sensitive Staphylococcus      aureus infection.  4.  History of tonsillectomy.  5.  Rotator cuff repair.   CURRENT MEDICATIONS:  1.  Cardizem which is discontinued today due to the development of a rash.  2.  Coumadin 3 mg four times a week and 3.5 mg three times a week.  3.  Extra Strength Tylenol b.i.d.  4.  Vitamin C 500 mg q.d.  5.  Vitamin E 40 mg q.d.  6.  Zinc 50 mg sporadically.  7.  Beta carotene 2500 units q.d.  8.  Ocuvite and Luteum 1 q.d.  9.  Lasix discontinued.  10. Demodex 20 mg q.d.  11. Atarax 25 mg 1/2 tablet p.r.n.   ALLERGIES:  She states that she is allergic to [facility] DYE for which she  passes out as well as she does with AMOXICILLIN.  She states she has not  developed a rash like this in response to any medication that she is aware  of.   SOCIAL HISTORY:  She is nonsmoker and nondrinker. She is widowed. She  currently lives at [facility] and ambulates with a  cane.   OBSTETRICAL HISTORY:  She is a G4, P4.   REVIEW OF SYMPTOMS:  She has no nausea, vomiting, or diarrhea. She does  report some dyspnea on exertion and some lightheadedness with exertion. She  has no chest pain and no abdominal pain. She does have chronic shortness of  breath that she had attributed to her previous pulmonary embolus. Under  edema, she states she has had this of her lower extremities in addition to  the tense erythema which she reports.   LABORATORY DATA:  EKG sent from Dr. Jaclyn Prime. Grove's office revealed atrial  flutter with a 2:1 AV conduction, rapid ventricular response. No laboratory  data at this time.   ASSESSMENT:  1.  Atrial flutter with a rapid ventricular response with her primary      cardiologist assessment of intolerance to beta blocker in addition to      now intolerant to Cardizem. Plans to attempt amiodarone at this time.  2.   Allergic reaction, questionably secondary to Cardizem.  3.  History of venous thromboembolism and pulmonary embolus in the past.  4.  History of methicillin-sensitive Staphylococcus aureus complicating      infection of her knee arthroplasty several years ago.   PLAN:  Ms. Fessel was admitted directly from Dr. Jaclyn Prime. Grove's office  for further management. She will undergo treatment of her allergic reaction  with IV Solu-Medrol. In addition, hold Cardizem and substitute amiodarone  load in hopes that we can achieve rate control. We will continue her  Coumadin. Check her PT and INR. If she is subtherapeutic, we will initiate  VT prophylaxis. We will follow her PT/INR. Follow her rate closely and  reassess with an EKG. We will follow her course clinically. If she does  respond to Solu-Medrol, we will taper her slowly on an oral dose of  prednisone.      ESS/MEDQ  D:  10/17/2004  T:  10/18/2004  Job:  [id number]   cc:   Jaclyn Prime. Lucas Mallow, M.D.  [address]  [place]  [place] [address]  Fax: [phone]

## 2010-12-20 ENCOUNTER — Emergency Department (HOSPITAL_COMMUNITY): Payer: Medicare Other

## 2010-12-20 ENCOUNTER — Inpatient Hospital Stay (HOSPITAL_COMMUNITY)
Admission: EM | Admit: 2010-12-20 | Discharge: 2010-12-31 | DRG: 464 | Disposition: A | Discharge status: Skilled Nursing Facility | Payer: Medicare Other | Attending: Orthopedic Surgery | Admitting: Orthopedic Surgery

## 2010-12-20 DIAGNOSIS — T84069A Wear of articular bearing surface of unspecified internal prosthetic joint, initial encounter: Secondary | ICD-10-CM | POA: Diagnosis present

## 2010-12-20 DIAGNOSIS — D62 Acute posthemorrhagic anemia: Secondary | ICD-10-CM | POA: Diagnosis not present

## 2010-12-20 DIAGNOSIS — Z79899 Other long term (current) drug therapy: Secondary | ICD-10-CM

## 2010-12-20 DIAGNOSIS — Z96659 Presence of unspecified artificial knee joint: Secondary | ICD-10-CM

## 2010-12-20 DIAGNOSIS — T84059A Periprosthetic osteolysis of unspecified internal prosthetic joint, initial encounter: Secondary | ICD-10-CM | POA: Diagnosis present

## 2010-12-20 DIAGNOSIS — I4892 Unspecified atrial flutter: Secondary | ICD-10-CM | POA: Diagnosis present

## 2010-12-20 DIAGNOSIS — T8450XA Infection and inflammatory reaction due to unspecified internal joint prosthesis, initial encounter: Principal | ICD-10-CM | POA: Diagnosis present

## 2010-12-20 DIAGNOSIS — I2782 Chronic pulmonary embolism: Secondary | ICD-10-CM | POA: Diagnosis present

## 2010-12-20 DIAGNOSIS — L02419 Cutaneous abscess of limb, unspecified: Secondary | ICD-10-CM | POA: Diagnosis present

## 2010-12-20 DIAGNOSIS — Z7901 Long term (current) use of anticoagulants: Secondary | ICD-10-CM

## 2010-12-20 DIAGNOSIS — B958 Unspecified staphylococcus as the cause of diseases classified elsewhere: Secondary | ICD-10-CM | POA: Diagnosis present

## 2010-12-20 DIAGNOSIS — L03119 Cellulitis of unspecified part of limb: Secondary | ICD-10-CM | POA: Diagnosis present

## 2010-12-20 DIAGNOSIS — Y831 Surgical operation with implant of artificial internal device as the cause of abnormal reaction of the patient, or of later complication, without mention of misadventure at the time of the procedure: Secondary | ICD-10-CM | POA: Diagnosis present

## 2010-12-20 LAB — URINALYSIS, ROUTINE W REFLEX MICROSCOPIC
Glucose, UA: NEGATIVE mg/dL
pH: 6.5 (ref 5.0–8.0)

## 2010-12-20 LAB — CBC
Hemoglobin: 12.1 g/dL (ref 12.0–15.0)
MCH: 25.1 pg — ABNORMAL LOW (ref 26.0–34.0)
MCV: 77.6 fL — ABNORMAL LOW (ref 78.0–100.0)
RBC: 4.83 MIL/uL (ref 3.87–5.11)

## 2010-12-20 LAB — URINE MICROSCOPIC-ADD ON

## 2010-12-20 LAB — BASIC METABOLIC PANEL
BUN: 21 mg/dL (ref 6–23)
CO2: 27 mEq/L (ref 19–32)
Chloride: 100 mEq/L (ref 96–112)
Creatinine, Ser: 1.32 mg/dL — ABNORMAL HIGH (ref 0.4–1.2)
Glucose, Bld: 114 mg/dL — ABNORMAL HIGH (ref 70–99)

## 2010-12-20 LAB — DIFFERENTIAL
Basophils Absolute: 0 10*3/uL (ref 0.0–0.1)
Eosinophils Relative: 1 % (ref 0–5)
Lymphs Abs: 4.4 10*3/uL — ABNORMAL HIGH (ref 0.7–4.0)
Monocytes Relative: 13 % — ABNORMAL HIGH (ref 3–12)
Neutro Abs: 4.5 10*3/uL (ref 1.7–7.7)

## 2010-12-21 LAB — SYNOVIAL CELL COUNT + DIFF, W/ CRYSTALS
Crystals, Fluid: NONE SEEN
Eosinophils-Synovial: 0 % (ref 0–1)
Lymphocytes-Synovial Fld: 10 % (ref 0–20)
Monocyte-Macrophage-Synovial Fluid: 12 % — ABNORMAL LOW (ref 50–90)
Neutrophil, Synovial: 78 % — ABNORMAL HIGH (ref 0–25)
WBC, Synovial: 29275 /mm3 — ABNORMAL HIGH (ref 0–200)

## 2010-12-21 LAB — CBC
HCT: 36.5 % (ref 36.0–46.0)
Hemoglobin: 12 g/dL (ref 12.0–15.0)
MCH: 25.4 pg — ABNORMAL LOW (ref 26.0–34.0)
MCHC: 32.9 g/dL (ref 30.0–36.0)
MCV: 77.3 fL — ABNORMAL LOW (ref 78.0–100.0)
Platelets: 98 10*3/uL — ABNORMAL LOW (ref 150–400)
RBC: 4.72 MIL/uL (ref 3.87–5.11)
RDW: 20.1 % — ABNORMAL HIGH (ref 11.5–15.5)
WBC: 9.8 10*3/uL (ref 4.0–10.5)

## 2010-12-22 LAB — CBC
HCT: 34.5 % — ABNORMAL LOW (ref 36.0–46.0)
MCHC: 33 g/dL (ref 30.0–36.0)
MCV: 77.5 fL — ABNORMAL LOW (ref 78.0–100.0)
RDW: 20.4 % — ABNORMAL HIGH (ref 11.5–15.5)

## 2010-12-22 LAB — URINE CULTURE: Special Requests: NEGATIVE

## 2010-12-23 LAB — CBC
MCH: 25.6 pg — ABNORMAL LOW (ref 26.0–34.0)
Platelets: 109 10*3/uL — ABNORMAL LOW (ref 150–400)
RBC: 4.18 MIL/uL (ref 3.87–5.11)
RDW: 20.4 % — ABNORMAL HIGH (ref 11.5–15.5)

## 2010-12-23 LAB — PROTIME-INR: Prothrombin Time: 26.1 seconds — ABNORMAL HIGH (ref 11.6–15.2)

## 2010-12-24 DIAGNOSIS — T8450XA Infection and inflammatory reaction due to unspecified internal joint prosthesis, initial encounter: Secondary | ICD-10-CM

## 2010-12-24 LAB — CREATININE, SERUM: GFR calc Af Amer: >60 mL/min (ref >60)

## 2010-12-24 LAB — CBC
MCH: 25.3 pg — ABNORMAL LOW (ref 26.0–34.0)
MCHC: 32.4 g/dL (ref 30.0–36.0)
MCV: 78.3 fL (ref 78.0–100.0)
Platelets: 123 10*3/uL — ABNORMAL LOW (ref 150–400)
RBC: 4.34 MIL/uL (ref 3.87–5.11)
RDW: 20.3 % — ABNORMAL HIGH (ref 11.5–15.5)

## 2010-12-24 LAB — VANCOMYCIN, TROUGH: Vancomycin Tr: 11.5 ug/mL (ref 10.0–20.0)

## 2010-12-25 DIAGNOSIS — T8450XA Infection and inflammatory reaction due to unspecified internal joint prosthesis, initial encounter: Secondary | ICD-10-CM

## 2010-12-25 LAB — BODY FLUID CULTURE

## 2010-12-25 LAB — CBC
HCT: 40.3 % (ref 36.0–46.0)
Hemoglobin: 12.7 g/dL (ref 12.0–15.0)
MCV: 78.4 fL (ref 78.0–100.0)
RBC: 5.14 MIL/uL — ABNORMAL HIGH (ref 3.87–5.11)
WBC: 11.9 10*3/uL — ABNORMAL HIGH (ref 4.0–10.5)

## 2010-12-25 LAB — PROTIME-INR: INR: 2.56 — ABNORMAL HIGH (ref 0.00–1.49)

## 2010-12-25 LAB — APTT: aPTT: 66 seconds — ABNORMAL HIGH (ref 24–37)

## 2010-12-26 LAB — CBC
MCHC: 32.2 g/dL (ref 30.0–36.0)
Platelets: 134 10*3/uL — ABNORMAL LOW (ref 150–400)
RDW: 20.3 % — ABNORMAL HIGH (ref 11.5–15.5)
WBC: 9.4 10*3/uL (ref 4.0–10.5)

## 2010-12-26 LAB — PROTIME-INR
Prothrombin Time: 15.9 seconds — ABNORMAL HIGH (ref 11.6–15.2)
Prothrombin Time: 15.6 seconds — ABNORMAL HIGH (ref 11.6–15.2)

## 2010-12-26 LAB — CULTURE, BLOOD (ROUTINE X 2)
Culture  Setup Time: 201205252335
Culture: NO GROWTH

## 2010-12-26 LAB — ANAEROBIC CULTURE

## 2010-12-26 LAB — APTT: aPTT: 47 seconds — ABNORMAL HIGH (ref 24–37)

## 2010-12-26 LAB — TSH: TSH: 6.432 u[IU]/mL — ABNORMAL HIGH (ref 0.350–4.500)

## 2010-12-27 ENCOUNTER — Inpatient Hospital Stay (HOSPITAL_COMMUNITY): Payer: Medicare Other

## 2010-12-27 LAB — HEMOGLOBIN AND HEMATOCRIT, BLOOD: HCT: 35.7 % — ABNORMAL LOW (ref 36.0–46.0)

## 2010-12-27 LAB — CBC
HCT: 32.3 % — ABNORMAL LOW (ref 36.0–46.0)
Hemoglobin: 10.7 g/dL — ABNORMAL LOW (ref 12.0–15.0)
MCV: 77.1 fL — ABNORMAL LOW (ref 78.0–100.0)
RDW: 20.2 % — ABNORMAL HIGH (ref 11.5–15.5)
WBC: 9 10*3/uL (ref 4.0–10.5)

## 2010-12-27 LAB — APTT: aPTT: 50 seconds — ABNORMAL HIGH (ref 24–37)

## 2010-12-27 LAB — T4: T4, Total: 7.5 ug/dL (ref 5.0–12.5)

## 2010-12-27 NOTE — Consult Note (Signed)
NAME:  CHIRSTINE, Jacobs NO.:  [id number]  MEDICAL RECORD NO.:  [id number]           PATIENT TYPE:  I  LOCATION:  1615                         FACILITY:  [facility]  PHYSICIAN:  Georges Lynch. Neelah Mannings, M.D.DATE OF BIRTH:  1915/05/26  DATE OF CONSULTATION:  12/20/2010 DATE OF DISCHARGE:                                CONSULTATION   HISTORY:  I was called to see [name] this evening to evaluate her left total knee that was done greater than 12 years ago.  She apparently this morning for the first time had a fever of 100.4 and she said she vomited x1.  She came in the emergency room by way of ambulance with that history.  She has also had a previous staph infection in the right knee several years ago, which actually was 9 years ago, and I did a I and D of her knee x2 and that was done in January 2003.  She in May 1999 had multiple pulmonary emboli and was started on Coumadin.  She also had a previous surgery on her opposite left shoulder for a large ganglion cyst.  She was also evaluated by Dr. Alanda Amass in the past for atrial flutter and that was on October 16, 2010.  The patient denied any other problems under her past history and medical history of hypertension.  She has a history of atrial flutter.  PAST MEDICAL HISTORY:  As I mentioned above.  REVIEW OF SYSTEMS:  HEAD, EYES, EARS, NOSE, and THROAT:  She wears glasses for vision.  No history of diabetes.  PULMONARY:  History of shortness of breath and she was admitted in the past by Dr. Alanda Amass as I mentioned earlier in March 2012.  History of multiple pulmonary emboli in May 1999.  CARDIOVASCULAR:  She has a history of atrial flutter. NEUROLOGIC:  No migraines or stroke.  DERMATOLOGY:  Noncontributory.  PHYSICAL EXAMINATION IN THE EMERGENCY ROOM:  VITAL SIGNS:  Her temperature was 98.7, her pulse was 72, respirations 24, blood pressure was 136/78. HEAD, EYES, EARS, NOSE, and THROAT:  Negative.  There were no lesions  in the oral cavity. LUNGS:  Clear. HEART:  Normal sinus rhythm with no murmur. ABDOMEN:  Soft, nontender, no masses.  Her hips were normal.  The right total knee was normal.  The left knee she had what appeared be a cellulitis of her left knee with an increased temperature and some mild erythema.  Her calf is soft, nontender, no phlebitis, the circulation was intact.  ALLERGIES:  AMOXICILLIN.  MEDICATIONS: 1. Toprol-XL. 2. Flagyl. 3. Amiodarone. 4. Diltiazem. 5. Penicillin. 6. Family provided me with another list here, calcium blockers, and     codeine. 7. Potassium chloride 8. Multaq 9. Coumadin 5 mg 10.Metoprolol tartrate 11.Furosemide 20 mg.  We will see if we can get a more additional list of medications from the family and I think we do have an additional list here.  In the morning, take Centrum Silver 1 tablet a day, potassium 1 tablet a day actually is Klor-Con, furosemide 20 mg 1 tablet every other day and 2 tablets on the other days, Multaq, she takes  with food 1 tablet at breakfast, in the midafternoon, she is on Coumadin 4 mg a day, at night she takes Ocuvite 1 tablet a day, magnesium 1 tablet a day, family said that the 174 stamped on the dose, potassium which is Klor-Con she takes 1 tablet a day, metoprolol one-half tablet once again I do not have the doses.  IMAGING STUDIES:  X-rays of the left total knee shows a complete wear of the polyethylene liner.  She has marked osteoporotic changes of her femoral component with some questionable loosening.  LABORATORY DATA:  Her blood studies, her sodium 137, potassium 4.4, chloride 100, glucose 114, and creatinine 1.32 which is just slightly elevated, calcium was 9.  Her BUN was 21, which was normal.  Her white count was 10,400, which was normal.  Hemoglobin 12.1, hematocrit 37. Her platelet count was 99.  I do not see an INR on here.  We will have to order an INR, PT, and PTT.  IMPRESSION: 1. Cellulitis, left  knee. 2. Complete wear of the polyethylene liner of the left total knee. 3. Rule out a deep infection in the left knee.  Note, she had a     previous staph infection of that knee 9 years ago, but has done     well since.  She had her knee aspirated by the emergency room     physician, not able to obtain any fluid, and I think this has     caused the diffuse swelling and cellulitis. Marland Kitchen  PLAN:  To go ahead and get her admitted, start her on some vancomycin just to assume that she may have an early infection, although her white count is normal.  She is afebrile, I am concerned if she has erythema and increased temperature about her knee and the fact she had an old staph infection and I will go ahead and see if we can get some blood cultures on her as well as an INR, PT, PTT          ______________________________ Georges Lynch. Darrelyn Hillock, M.D.     RAG/MEDQ  D:  12/20/2010  T:  12/21/2010  Job:  [id number]  cc:   Gerlene Burdock A. Alanda Amass, M.D. Fax: [phone]  Electronically Signed by Ranee Gosselin M.D. on 12/27/2010 07:32:02 AM

## 2010-12-28 LAB — BASIC METABOLIC PANEL
CO2: 27 mEq/L (ref 19–32)
Chloride: 99 mEq/L (ref 96–112)
GFR calc Af Amer: >60 mL/min (ref >60)
Glucose, Bld: 115 mg/dL — ABNORMAL HIGH (ref 70–99)
Potassium: 4 mEq/L (ref 3.5–5.1)
Sodium: 134 mEq/L — ABNORMAL LOW (ref 135–145)

## 2010-12-28 LAB — CBC
MCV: 78.5 fL (ref 78.0–100.0)
Platelets: 173 10*3/uL (ref 150–400)
RBC: 4.13 MIL/uL (ref 3.87–5.11)
WBC: 13.3 10*3/uL — ABNORMAL HIGH (ref 4.0–10.5)

## 2010-12-28 LAB — PROTIME-INR
INR: 1.31 (ref 0.00–1.49)
Prothrombin Time: 16.5 seconds — ABNORMAL HIGH (ref 11.6–15.2)

## 2010-12-28 LAB — VANCOMYCIN, TROUGH: Vancomycin Tr: 16.5 ug/mL (ref 10.0–20.0)

## 2010-12-28 LAB — APTT: aPTT: 44 seconds — ABNORMAL HIGH (ref 24–37)

## 2010-12-29 LAB — CBC
MCHC: 32.6 g/dL (ref 30.0–36.0)
Platelets: 157 10*3/uL (ref 150–400)
RDW: 20.2 % — ABNORMAL HIGH (ref 11.5–15.5)
WBC: 10.4 10*3/uL (ref 4.0–10.5)

## 2010-12-29 LAB — APTT: aPTT: 52 seconds — ABNORMAL HIGH (ref 24–37)

## 2010-12-29 LAB — PROTIME-INR: INR: 1.84 — ABNORMAL HIGH (ref 0.00–1.49)

## 2010-12-30 LAB — CROSSMATCH
Antibody Screen: NEGATIVE
Unit division: 0

## 2010-12-30 LAB — CBC
HCT: 28.1 % — ABNORMAL LOW (ref 36.0–46.0)
Hemoglobin: 9 g/dL — ABNORMAL LOW (ref 12.0–15.0)
MCH: 25.1 pg — ABNORMAL LOW (ref 26.0–34.0)
MCHC: 32 g/dL (ref 30.0–36.0)
RDW: 20.1 % — ABNORMAL HIGH (ref 11.5–15.5)

## 2010-12-30 LAB — PROTIME-INR
INR: 1.8 — ABNORMAL HIGH (ref 0.00–1.49)
Prothrombin Time: 21.1 seconds — ABNORMAL HIGH (ref 11.6–15.2)

## 2010-12-31 LAB — DIFFERENTIAL
Basophils Relative: 1 % (ref 0–1)
Eosinophils Absolute: 0.2 10*3/uL (ref 0.0–0.7)
Eosinophils Relative: 2 % (ref 0–5)
Lymphocytes Relative: 56 % — ABNORMAL HIGH (ref 12–46)
Neutro Abs: 2.8 10*3/uL (ref 1.7–7.7)
Neutrophils Relative %: 26 % — ABNORMAL LOW (ref 43–77)

## 2010-12-31 LAB — CBC
HCT: 26.4 % — ABNORMAL LOW (ref 36.0–46.0)
Hemoglobin: 8.5 g/dL — ABNORMAL LOW (ref 12.0–15.0)
MCV: 78.3 fL (ref 78.0–100.0)
RDW: 20.2 % — ABNORMAL HIGH (ref 11.5–15.5)
WBC: 10.9 10*3/uL — ABNORMAL HIGH (ref 4.0–10.5)

## 2010-12-31 LAB — APTT: aPTT: 61 seconds — ABNORMAL HIGH (ref 24–37)

## 2010-12-31 LAB — PROTIME-INR: INR: 1.82 — ABNORMAL HIGH (ref 0.00–1.49)

## 2011-01-01 LAB — ANAEROBIC CULTURE

## 2011-01-01 LAB — BODY FLUID CULTURE

## 2011-01-01 NOTE — Discharge Summary (Addendum)
NAME:  [name], [name] NO.:  [id number]  MEDICAL RECORD NO.:  [id number]  LOCATION:  1406                         FACILITY:  [facility]  PHYSICIAN:  Rozell Searing, [facility]    DATE OF BIRTH:  Aug 24, 1914  DATE OF ADMISSION:  12/20/2010 DATE OF DISCHARGE:  12/31/2010                              DISCHARGE SUMMARY   ADMITTING DIAGNOSES: 1. Infected left total knee arthroplasty. 2. History of atrial flutter.  DISCHARGE DIAGNOSES: 1. Infected left total knee arthroplasty, status post removal of     prosthesis with insertion of antibiotic spacer. 2. History of atrial flutter.  HOSPITAL COURSE:  [name] presented to the emergency department on Dec 20, 2010, with complaints of left knee pain.  She had a left total knee arthroplasty that was done greater than 12 years ago by Dr. Darrelyn Hillock.  She presented with fever, nausea, vomiting and pain.  She was brought to the emergency department by ambulance as she was unable to bear weight.  Dr. Darrelyn Hillock was on-call for the group that night, so he was called for consult.  He admitted the patient to [facility].  She was started on IV vancomycin per pharmacy.  Infectious disease was called for consultation.  Dr. Charlann Boxer aspirated the knee and sent the fluid down for culture and sensitivity on 05/29.  The culture results came back showing staph infection.  After consultation with Infectious Disease, it was recommended that she undergo a resection arthroplasty of the left total knee.  Dr. Darrelyn Hillock had a discussion with the patient and the family.  They have decided they would like to go ahead with the procedure.  On December 27, 2010, [name] underwent a left total knee resection arthroplasty and placement of antibiotic spacer.  No complications during the procedure.  She was returned to the recovery room in satisfactory condition.  She was then taken to the 4th floor to be on telemetry.  In fact, she did receive 2 units of packed  cells intraoperatively and immediately postoperatively; I believe it is 1 unit during and then 1 in the PACU.  She was restarted on the vancomycin protocol.  On postoperative day 2, the Hemovac was still draining, so it was left in there.  PCA was discontinued.  Hemoglobin held steady at 10.5.  Social Work consult was also involved.  The patient is a resident of [facility] [address] independent living portion, so we are hoping to get her into the skilled nursing portion of [facility].  Postop day 2, the patient continued to progress, Hemovac pulled, IV antibiotic was continued.  Postoperative day 3, the patient continued to progress.  We anticipate her going to skilled nursing tomorrow.  DISPOSITION:  To skilled nursing facility on December 31, 2010.  PROCEDURE:  On December 27, 2010, [name] was taken to the operating room by surgeon, Dr. Ranee Gosselin, assistant, Rozell Searing PA-C. She underwent left total knee resection arthroplasty and placement of antibiotic spacer for staph infection.  She had had significant wear to the polyethylene liner.  There was no gross pus noted throughout the procedure.  Cultures were taken intraoperatively.  The patient received 1 unit of packed red blood cells during surgery  and then another unit in the PACU.  There were no complications with the procedure.  The patient was returned to the recovery room in satisfactory position. Postoperative radiographs revealed removal of three-part left total knee arthroplasty with implantation of antibiotic-impregnated muscles __________ cement.  LABORATORY DATA:  On admission to the emergency department, CBC was drawn which revealed white count of 10.4, hemoglobin 12.1, hematocrit 37.5 and a platelet count of 99.  No fever was noted on arrival to emergency department.  INR drawn was 1.87.  Chemistry panel revealed elevated glucose at 114, and elevated creatinine at 1.32.  Aspiration of the knee joint revealed brown  turbid synovial fluid.  She also had an elevated CRP at 12.3.  Urinalysis was negative for infection.  CBC was monitored throughout her hospital stay.  She began to have an elevated white count on Dec 25, 2010, that reached 11.9, hemoglobin 12.7, hematocrit 40.3, and her platelet count was up to 151.  The patient had been placed on Coumadin and her INR was therapeutic by Dec 23, 2010, at 2.38.  On postoperative day 1, the patient's hemoglobin had dropped to 10.5, hematocrit 32.4.  Her INR was 1.3 and she was restarted on Coumadin.  She was given vitamin K preoperatively to decrease her INR, so she would be safe for surgery.  Postoperative day 2, hemoglobin dropped to 8.8, INR was up to 1.84.  Postoperative day 3, her hemoglobin was at 9.0 with a white count 11.3, and her INR was 1.8.  She did not require further blood transfusion after what she received during and after surgery, on June 1.  Just as a side note, the patient's TSH was noted to be elevated at 6.432.  This will be followed by the patient's primary care physician.  MEDICATIONS ON DISCHARGE: 1. Acetaminophen 500 mg 1 tablet p.o. t.i.d. p.r.n. pain. 2. Magnesium oxide 400 mg 1 tablet p.o. daily. 3. Multaq 400 mg 1 tablet p.o. b.i.d. 4. Coumadin; this should be dosed per skilled nursing facility     pharmacy.  Her INR needs to be kept between 2 and 3 to remain     therapeutic. 5. Metoprolol 12.5 mg 1 tablet p.o. daily. 6. Senna 1 tablet p.o. b.i.d. 7. Robaxin 500 mg 1 tablet p.o. q.6 h p.r.n. muscle spasms. 8. Ferrous sulfate 325 mg 1 tablet p.o. t.i.d. 9. Lasix 20 mg 1 to 2 tablets p.o. daily p.r.n. water retention. 10.Multivitamin once daily. 11.Ocuvite 1 tablet p.o. q. p.m. 12.Oxycodone/acetaminophen 5/325 mg 1 to 2 tablets p.o. q.4 to 6 hours     p.r.n. pain. 13.Vancomycin; she was given 1250 mg IV q.24 h.  Quantity for this,     she will need enough to last for at least 30 days maybe longer, if     preferred by  Infectious Disease to be longer. 14.Potassium chloride 10 mEq 1 tablet p.o. b.i.d.  DISCHARGE INSTRUCTIONS: 1. Activity:  The patient is nonweightbearing.  Please note she does     not have a total knee.  She has an antibiotics spacer in her knee.     She is to wear the knee immobilizer to go from bed to chair with     the help of a walker and assistance.  She does not need the knee     immobilizer while in bed.  Whenever she has a knee immobilizer on,     she needs to have padding above and below-the-knee on the posterior     aspect.  She is able to work with therapy and do straight leg     raises but, again, no weightbearing.  She can do straight leg     raises and abduction and adduction exercises in straight leg     position, nonweightbearing. 2. Wound care:  She will need daily dressing change.  She is able to     shower, just needs to be sure that she is not submerging the wound     in water and she will definitely needs assistance with bathing and     showering. 3. Diet:  Heart-healthy diet. 4. Followup:  She will follow up in the office with Dr. Darrelyn Hillock 2     weeks from the day of surgery, so it will be around Thursday, June     14, the patient contact the office at [phone] to schedule the     appointment.  Skilled nursing facility should call and make this     appointment to schedule the appointment and help transport the     patient.  CONDITION ON DISCHARGE:  Stable and improving.  Dictated For:  Georges Lynch. Darrelyn Hillock, MD     Rozell Searing, [facility]     LD/MEDQ  D:  12/30/2010  T:  12/30/2010  Job:  [id number]  Electronically Signed by Rozell Searing  on 01/01/2011 08:14:59 AM Electronically Signed by Ranee Gosselin M.D. on 02/01/2011 08:18:12 AM

## 2011-01-02 ENCOUNTER — Other Ambulatory Visit (HOSPITAL_COMMUNITY): Payer: Self-pay | Admitting: Orthopedic Surgery

## 2011-01-02 DIAGNOSIS — T8140XA Infection following a procedure, unspecified, initial encounter: Secondary | ICD-10-CM

## 2011-01-03 ENCOUNTER — Ambulatory Visit (HOSPITAL_COMMUNITY)
Admission: RE | Admit: 2011-01-03 | Discharge: 2011-01-03 | Disposition: A | Discharge status: Home / Self Care | Payer: Medicare Other | Source: Ambulatory Visit | Attending: Orthopedic Surgery | Admitting: Orthopedic Surgery

## 2011-01-03 DIAGNOSIS — T8140XA Infection following a procedure, unspecified, initial encounter: Secondary | ICD-10-CM

## 2011-01-03 DIAGNOSIS — T82598A Other mechanical complication of other cardiac and vascular devices and implants, initial encounter: Secondary | ICD-10-CM | POA: Insufficient documentation

## 2011-01-03 DIAGNOSIS — Y838 Other surgical procedures as the cause of abnormal reaction of the patient, or of later complication, without mention of misadventure at the time of the procedure: Secondary | ICD-10-CM | POA: Insufficient documentation

## 2011-01-03 DIAGNOSIS — L089 Local infection of the skin and subcutaneous tissue, unspecified: Secondary | ICD-10-CM | POA: Insufficient documentation

## 2011-01-04 NOTE — Op Note (Signed)
NAME:  [name], [name] NO.:  [id number]  MEDICAL RECORD NO.:  [id number]           PATIENT TYPE:  I  LOCATION:  1615                         FACILITY:  [facility]  PHYSICIAN:  Georges Lynch. Moranda Billiot, M.D.DATE OF BIRTH:  Jul 09, 1915  DATE OF PROCEDURE: DATE OF DISCHARGE:                              OPERATIVE REPORT   PREOPERATIVE DIAGNOSES: 1. Infected left total knee arthroplasty. 2. 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  BACKGROUND:  I did a left total knee arthroplasty on this nice lady about 22 years ago.  She did extremely well until about 9 years ago, she developed a low-grade staph infection.  At that time, we did an irrigation and debridement of her knee and she did extremely well up until 9 years later, which was about 1 week ago.  She presented to the emergency room with a tender and warm left knee.  The aspirate showed rare staph in the knee.  Her plain x-ray showed loosening of the femoral component with complete wear of the polyethylene tibial liner.  PROCEDURE:  Under general anesthesia, routine orthopedic prep and draping of left lower extremity was carried out.  The appropriate time- out was carried out first.  I also marked the appropriate left leg in the holding area.  Note, preoperatively, she was given 10 mg of vitamin K to bring her INR level down to 1.2.  She was covered with Lovenox the day prior to surgery once her INR dropped below 2.  At this time, after sterile prep and draping, a time-out as I mentioned was carried out, the leg was exsanguinated with an Esmarch, tourniquet was elevated to 350 mmHg.  Following that, the leg was placed in a DeMayo knee holder.  The knee was flexed and an incision was made over the anterior aspect of the left knee.  Bleeders were identified and cauterized.  At this time, 2 flaps were created.  I then carried out a median parapatellar  incision reflecting patella laterally and at this particular time the patellar button literally came right off, it was a metal backed patella.  I did a thorough debridement and synovectomy around the patella and also curetted the patella and made sure there were no loose pieces of cement present.  After this, I then utilized the oscillating saw with a small blade and removed our distal femoral component.  I then removed the loose pieces of cement.  I curetted out the canal on the femur as well. Once again, we did a thorough synovectomy.  Once the femur was removed, I then removed the polyethylene liner of the tibia, which was extremely worn.  After this, I then utilized the oscillating saw to remove the tibial component, which then came out as well.  We then went down into the canal and removed a large cement plug utilizing a small osteotome and a pituitary rongeur.  We curetted out all the cement in the tibial plateau.  We had an extremely good amount of bone remaining for future revision.  We thoroughly irrigated out the area, continued debriding  the area, removed the posterior cruciate ligament, which was intact. Following that after we did a thorough water picking and irrigation of the knee, I then made gentamicin cement spacers for the femur, tibia and one in between.  We waited till the cement was somewhat hardened in a putty phase to insert it.  We then extended the knee and removed any excessive amount of cement.  I then inserted a Hemovac drain, closed the knee in layered usual fashion.  At first I injected 10 cc of FloSeal into the knee.  I then applied some thrombin-soaked Gelfoam.  The skin then was closed with metal staples. Note, "cultures and sensitivities" were taken in the operating room and sent to the lab.  Overall, "we had excellent bone stock remaining."  I think that if she can get by with this knee and we clear up this low- grade infection, that she would be a  candidate for a knee revision.          ______________________________ Georges Lynch [name], M.D.     RAG/MEDQ  D:  12/27/2010  T:  12/27/2010  Job:  [id number]  cc:   Lacretia Leigh. Ninetta Lights, M.D. Fax: [phone]  Richard A. Alanda Amass, M.D. Fax: [phone]  Electronically Signed by Ranee Gosselin M.D. on 01/04/2011 05:16:13 PM

## 2011-02-24 ENCOUNTER — Emergency Department (HOSPITAL_COMMUNITY): Payer: Medicare Other

## 2011-02-24 ENCOUNTER — Inpatient Hospital Stay (HOSPITAL_COMMUNITY)
Admission: EM | Admit: 2011-02-24 | Discharge: 2011-02-28 | DRG: 812 | Disposition: A | Discharge status: Skilled Nursing Facility | Payer: Medicare Other | Attending: Internal Medicine | Admitting: Internal Medicine

## 2011-02-24 DIAGNOSIS — M21869 Other specified acquired deformities of unspecified lower leg: Secondary | ICD-10-CM | POA: Diagnosis present

## 2011-02-24 DIAGNOSIS — I509 Heart failure, unspecified: Secondary | ICD-10-CM | POA: Diagnosis present

## 2011-02-24 DIAGNOSIS — Z86711 Personal history of pulmonary embolism: Secondary | ICD-10-CM

## 2011-02-24 DIAGNOSIS — C9110 Chronic lymphocytic leukemia of B-cell type not having achieved remission: Secondary | ICD-10-CM | POA: Diagnosis present

## 2011-02-24 DIAGNOSIS — D630 Anemia in neoplastic disease: Principal | ICD-10-CM | POA: Diagnosis present

## 2011-02-24 DIAGNOSIS — I498 Other specified cardiac arrhythmias: Secondary | ICD-10-CM | POA: Diagnosis present

## 2011-02-24 DIAGNOSIS — I2789 Other specified pulmonary heart diseases: Secondary | ICD-10-CM | POA: Diagnosis present

## 2011-02-24 DIAGNOSIS — R42 Dizziness and giddiness: Secondary | ICD-10-CM | POA: Diagnosis present

## 2011-02-24 DIAGNOSIS — I4891 Unspecified atrial fibrillation: Secondary | ICD-10-CM | POA: Diagnosis present

## 2011-02-24 DIAGNOSIS — I428 Other cardiomyopathies: Secondary | ICD-10-CM | POA: Diagnosis present

## 2011-02-24 DIAGNOSIS — I4892 Unspecified atrial flutter: Secondary | ICD-10-CM | POA: Diagnosis present

## 2011-02-24 DIAGNOSIS — I10 Essential (primary) hypertension: Secondary | ICD-10-CM | POA: Diagnosis present

## 2011-02-24 DIAGNOSIS — N179 Acute kidney failure, unspecified: Secondary | ICD-10-CM | POA: Diagnosis present

## 2011-02-24 DIAGNOSIS — I5022 Chronic systolic (congestive) heart failure: Secondary | ICD-10-CM | POA: Diagnosis present

## 2011-02-24 LAB — DIFFERENTIAL
Basophils Absolute: 0 10*3/uL (ref 0.0–0.1)
Lymphocytes Relative: 64 % — ABNORMAL HIGH (ref 12–46)
Lymphs Abs: 3 10*3/uL (ref 0.7–4.0)
Monocytes Relative: 25 % — ABNORMAL HIGH (ref 3–12)

## 2011-02-24 LAB — CBC
HCT: 16.8 % — ABNORMAL LOW (ref 36.0–46.0)
Hemoglobin: 5.6 g/dL — CL (ref 12.0–15.0)
MCHC: 33.3 g/dL (ref 30.0–36.0)

## 2011-02-24 LAB — URINALYSIS, ROUTINE W REFLEX MICROSCOPIC
Leukocytes, UA: NEGATIVE
Nitrite: NEGATIVE
Protein, ur: NEGATIVE mg/dL

## 2011-02-24 LAB — COMPREHENSIVE METABOLIC PANEL
Alkaline Phosphatase: 58 U/L (ref 39–117)
BUN: 23 mg/dL (ref 6–23)
GFR calc Af Amer: 50 mL/min — ABNORMAL LOW (ref >60)
Glucose, Bld: 117 mg/dL — ABNORMAL HIGH (ref 70–99)
Potassium: 3.9 mEq/L (ref 3.5–5.1)
Total Bilirubin: 0.3 mg/dL (ref 0.3–1.2)
Total Protein: 6 g/dL (ref 6.0–8.3)

## 2011-02-24 LAB — APTT: aPTT: 67 seconds — ABNORMAL HIGH (ref 24–37)

## 2011-02-24 LAB — PROTIME-INR: INR: 1.76 — ABNORMAL HIGH (ref 0.00–1.49)

## 2011-02-25 LAB — BASIC METABOLIC PANEL
BUN: 20 mg/dL (ref 6–23)
Creatinine, Ser: 1.09 mg/dL (ref 0.50–1.10)
GFR calc Af Amer: 57 mL/min — ABNORMAL LOW (ref >60)
GFR calc non Af Amer: 47 mL/min — ABNORMAL LOW (ref >60)

## 2011-02-25 LAB — CBC
HCT: 21.2 % — ABNORMAL LOW (ref 36.0–46.0)
MCHC: 34.9 g/dL (ref 30.0–36.0)
MCV: 81.5 fL (ref 78.0–100.0)
RDW: 17.2 % — ABNORMAL HIGH (ref 11.5–15.5)

## 2011-02-25 LAB — IRON AND TIBC
Iron: 81 ug/dL (ref 42–135)
TIBC: 182 ug/dL — ABNORMAL LOW (ref 250–470)

## 2011-02-25 LAB — CARDIAC PANEL(CRET KIN+CKTOT+MB+TROPI): CK, MB: 1.3 ng/mL (ref 0.3–4.0)

## 2011-02-25 LAB — HEMOGLOBIN AND HEMATOCRIT, BLOOD
HCT: 23.5 % — ABNORMAL LOW (ref 36.0–46.0)
Hemoglobin: 7.3 g/dL — ABNORMAL LOW (ref 12.0–15.0)
Hemoglobin: 8.3 g/dL — ABNORMAL LOW (ref 12.0–15.0)

## 2011-02-25 LAB — VITAMIN B12: Vitamin B-12: 494 pg/mL (ref 211–911)

## 2011-02-25 LAB — OCCULT BLOOD X 1 CARD TO LAB, STOOL: Fecal Occult Bld: NEGATIVE

## 2011-02-25 NOTE — H&P (Signed)
NAME:  [name], [name] NO.:  [id number]  MEDICAL RECORD NO.:  [id number]  LOCATION:  WLED                         FACILITY:  [facility]  PHYSICIAN:  Mauro Kaufmann, MD         DATE OF BIRTH:  13-May-1915  DATE OF ADMISSION:  02/24/2011 DATE OF DISCHARGE:                             HISTORY & PHYSICAL   PRIMARY CARE PHYSICIAN:  Lenon Curt. Chilton Si, MD  CHIEF COMPLAINT:  Dizziness.  HISTORY OF PRESENT ILLNESS:  This is a 77 year old female, who was [facility] from the [facility] after the patient has been complaining of some dizziness over the past  3 to 4 days.  The patient did not pass out, did not fall.  The patient is able to provide some history.  She is not confused at this time.  The patient says that she did not have any chest pain, no shortness of breath, but was only dizzy.  The lab tests performed in the nursing home showed hemoglobin of 5.8, so the patient was sent to the [facility] for further evaluation and blood confusion.  The patient has a history of atrial flutter and has been on Coumadin.  The stool for occult blood in the ER was negative for any blood.  The patient also has a history of infected left total knee arthroplasty and she underwent debridement of the area done by Dr. Darrelyn Hillock on December 28, 2010.  The patient was discharged on vancomycin and has completed the vancomycin course on February 11, 2011, which was confirmed with the daughter.  The patient is not taking vancomycin at this time.  The patient is supposed to go for another knee surgery on March 05, 2011, as per Dr. Darrelyn Hillock.  PAST MEDICAL HISTORY:  Significant for, 1. Nonischemic cardiomyopathy with EF of 35%. 2. Atrial flutter, currently on Coumadin and Multaq. 3. History of pulmonary embolism. 4. Hypertension.  PAST SURGICAL HISTORY:  The patient had a knee placement done and she is status post left knee arthroplasty.  SOCIAL HISTORY:  The patient is nonsmoker, nonalcoholic.  No  history of illicit drug abuse.  ALLERGIES:  THE PATIENT IS ALLERGIC TO: 1. AMOXICILLIN. 2. DILTIAZEM. 3. AMIODARONE. 4. TOPROL-XL. 5. METRONIDAZOLE. 6. CIPRO. 7. CODEINE. 8. PENICILLIN. 9. LATEX.  CURRENT MEDICATIONS:  The patient is on includes, 1. Florastor 250 mg p.o. 2 capsules q.a.m. 2. Oxycodone/acetaminophen 5/325 two tablets every 6 hours as needed. 3. Methocarbamol 500 mg 1 tablet p.o. every 6 hours as needed. 4. Acetaminophen 500 mg 2 tablets every 8 hours as needed. 5. Arginate supplement one packet twice a day. 6. Coumadin 2 mg p.o. 7. Magnesium oxide 400 mg p.o. daily. 8. Ocuvite beta p.o. 1 tablet every evening. 9. Ferrous sulfate 325 one p.o. t.i.d. after meals. 10.Os-Cal 500 mg p.o. b.i.d. 11.Multaq 400 mg 1 tablet p.o. b.i.d. with meals. 12.Senokot 8.6 mg 1 tablet p.o. b.i.d. 13.Mucinex XR 600 mg 1 tablet p.o. b.i.d. 14.Potassium chloride 10 mEq p.o. daily. 15.Metoprolol 25 mg, 0.5 mg tablet every morning. 16.Furosemide 20 mg one p.o. daily. 17.Vancomycin.  The patient completed this on February 11, 2011. 18.Claritin 10 mg p.o. daily. 19.Heparin flush per PICC line. 20.Certavite-Antioxidant 18/0.4 mg  tablet every morning.  REVIEW OF SYSTEMS:  HEENT:  There is no headache, no blurred vision.  No runny nose or sore throat.  NECK:  No history of thyroid problems. CHEST:  No shortness of breath.  HEART:  Positive history of nonischemic cardiomyopathy with AFib.  GI:  Recent diarrhea, but diarrhea has resolved as per daughter, which was thought to be secondary to vancomycin.  The diarrhea resolved after vancomycin was stopped. NEUROLOGICAL:  No history of stroke or TIA.  PHYSICAL EXAMINATION:  VITAL SIGNS:  The patient's blood pressure 101/65, temperature 98.3, pulse 68, respirations 16. HEENT:  Head is atraumatic, normocephalic.  Eyes, extraocular muscles intact.  Oral mucosa is pink and moist. NECK:  Supple. CHEST:  Clear to auscultation bilaterally. HEART:   S1 and S2 with regular rate and rhythm. ABDOMEN:  Soft, nontender.  No organomegaly. EXTREMITIES:  No cyanosis, no clubbing, no edema.  Left knee has a visible scar noted on the ventral surface with no erythema noted, but mild edema noted.  Also, it is mildly tender to palpation.  The patient has limited mobility on the left leg due to the recent left knee arthroplasty as well as the cellulitis of the left lower extremity. NEURO:  Cranial nerves II through XII grossly intact.  Motor strength is 5/5 in both the lower and upper extremities and also right lower extremity.  Left lower extremity has limited mobility due to the pain.  LABORATORY DATA:  Pertinent labs, WBC 4.7, hemoglobin 5.6, hematocrit 16.8, platelet count of 146,000.  INR 1.76.  Sodium 133, potassium 3.9, chloride 97, CO2 of 26, BUN 23, creatinine 1.21, glucose 117.  UA is negative.  Fecal occult blood is negative.  ASSESSMENT: 1. Anemia. 2. History of atrial flutter. 3. History of left knee arthroplasty with recent treatment for     cellulitis of the left lower extremity. 4. History of nonischemic cardiomyopathy with the EF 35%.  PLAN: 1. Anemia.  The patient's hemoglobin has dropped.  Her baseline     hemoglobin was around 8.5 when she was discharged on December 28, 2010.     We are going to get the anemia panel and also check stool for     occult blood.  The first stool for occult blood is negative in the     ER.  The patient will be given 2 units of blood transfusion and     also we will give Lasix 20 mg IV x1 between the blood transfusions.     The patient does have a history of congestive heart failure, so we     will have to be careful with her fluid status.  I will check a BNP     and also chest x-ray to make sure that the patient is not having     fluid overload. 2. Acute renal insufficiency.  The patient creatinine was less than 1     when she was discharged in June.  At this time, it is 1.7, which is      probably secondary to Lasix use at home.  We will monitor the     patient's renal functions in the a.m. 3. History of atrial flutter.  The patient will be continued on the     Multaq and Coumadin.  The patient does not have any guaiac positive     stool or black colored stools, any overt signs of bleeding at this     time, so we will hold  the Coumadin  at this time.  But if the     patient starts bleeding, the Coumadin will have to be stopped.     Currently, her INR is 1.76. 4. History of cellulitis, status post left knee arthroplasty.  The     patient has completed vancomycin, so we will not give any more     vancomycin at this time.  Her last dose of vancomycin was February 11, 2011. 5. Code status.  The patient is a full code at this time. 6. The patient is supposed to have another knee surgery done on March 05, 2011, and Dr. Darrelyn Hillock is the patient's orthopedic surgeon.  If     she is in hospital for more than 2 or 3 days, then  Dr. Darrelyn Hillock can     be consulted and see if he can do with the surgery while the     patient is in the hospital.     Mauro Kaufmann, MD     GL/MEDQ  D:  02/24/2011  T:  02/24/2011  Job:  [id number]  Electronically Signed by Sibyl Parr Izaan Kingbird  on 02/25/2011 12:09:32 PM

## 2011-02-26 LAB — BASIC METABOLIC PANEL
BUN: 18 mg/dL (ref 6–23)
Chloride: 99 mEq/L (ref 96–112)
Creatinine, Ser: 1.06 mg/dL (ref 0.50–1.10)
Glucose, Bld: 114 mg/dL — ABNORMAL HIGH (ref 70–99)
Potassium: 3.4 mEq/L — ABNORMAL LOW (ref 3.5–5.1)

## 2011-02-26 LAB — CARDIAC PANEL(CRET KIN+CKTOT+MB+TROPI)
CK, MB: 1 ng/mL (ref 0.3–4.0)
Relative Index: INVALID (ref 0.0–2.5)
Troponin I: <0.3 ng/mL (ref <0.30)
Troponin I: <0.3 ng/mL (ref <0.30)

## 2011-02-26 LAB — CBC
Hemoglobin: 7.2 g/dL — ABNORMAL LOW (ref 12.0–15.0)
MCH: 27.9 pg (ref 26.0–34.0)
MCHC: 33.6 g/dL (ref 30.0–36.0)
MCV: 82.9 fL (ref 78.0–100.0)
Platelets: 143 10*3/uL — ABNORMAL LOW (ref 150–400)
RBC: 2.58 MIL/uL — ABNORMAL LOW (ref 3.87–5.11)

## 2011-02-27 LAB — BASIC METABOLIC PANEL
BUN: 17 mg/dL (ref 6–23)
CO2: 28 mEq/L (ref 19–32)
Calcium: 8.6 mg/dL (ref 8.4–10.5)
Chloride: 101 mEq/L (ref 96–112)
Creatinine, Ser: 0.96 mg/dL (ref 0.50–1.10)
Glucose, Bld: 108 mg/dL — ABNORMAL HIGH (ref 70–99)

## 2011-02-27 LAB — DIFFERENTIAL
Basophils Relative: 0 % (ref 0–1)
Lymphs Abs: 2.6 10*3/uL (ref 0.7–4.0)
Monocytes Absolute: 0.6 10*3/uL (ref 0.1–1.0)
Neutro Abs: 0.2 10*3/uL — ABNORMAL LOW (ref 1.7–7.7)
Neutrophils Relative %: 6 % — ABNORMAL LOW (ref 43–77)

## 2011-02-27 LAB — CBC
HCT: 22.2 % — ABNORMAL LOW (ref 36.0–46.0)
Hemoglobin: 7.3 g/dL — ABNORMAL LOW (ref 12.0–15.0)
MCH: 28.1 pg (ref 26.0–34.0)
MCV: 85.4 fL (ref 78.0–100.0)
RBC: 2.6 MIL/uL — ABNORMAL LOW (ref 3.87–5.11)

## 2011-02-27 LAB — PREPARE RBC (CROSSMATCH)

## 2011-02-28 LAB — DIFFERENTIAL
Basophils Relative: 0 % (ref 0–1)
Eosinophils Relative: 2 % (ref 0–5)
Lymphs Abs: 4.3 10*3/uL — ABNORMAL HIGH (ref 0.7–4.0)
Monocytes Relative: 5 % (ref 3–12)
Neutro Abs: 0.4 10*3/uL — ABNORMAL LOW (ref 1.7–7.7)

## 2011-02-28 LAB — BASIC METABOLIC PANEL
Calcium: 8.9 mg/dL (ref 8.4–10.5)
GFR calc non Af Amer: >60 mL/min (ref >60)
Glucose, Bld: 93 mg/dL (ref 70–99)
Potassium: 3.5 mEq/L (ref 3.5–5.1)
Sodium: 135 mEq/L (ref 135–145)

## 2011-02-28 LAB — CROSSMATCH: Unit division: 0

## 2011-02-28 LAB — CBC
HCT: 29.4 % — ABNORMAL LOW (ref 36.0–46.0)
MCV: 85 fL (ref 78.0–100.0)
Platelets: 159 10*3/uL (ref 150–400)
RBC: 3.46 MIL/uL — ABNORMAL LOW (ref 3.87–5.11)
RDW: 17.3 % — ABNORMAL HIGH (ref 11.5–15.5)
WBC: 5.8 10*3/uL (ref 4.0–10.5)

## 2011-02-28 LAB — PROTIME-INR: INR: 1.42 (ref 0.00–1.49)

## 2011-03-01 LAB — URINE CULTURE
Colony Count: >=100000
Culture  Setup Time: 201208020140

## 2011-03-04 ENCOUNTER — Inpatient Hospital Stay (HOSPITAL_COMMUNITY)
Admission: RE | Admit: 2011-03-04 | Discharge: 2011-03-12 | DRG: 467 | Disposition: A | Discharge status: Skilled Nursing Facility | Payer: Medicare Other | Source: Ambulatory Visit | Attending: Orthopedic Surgery | Admitting: Orthopedic Surgery

## 2011-03-04 DIAGNOSIS — Z880 Allergy status to penicillin: Secondary | ICD-10-CM

## 2011-03-04 DIAGNOSIS — B965 Pseudomonas (aeruginosa) (mallei) (pseudomallei) as the cause of diseases classified elsewhere: Secondary | ICD-10-CM | POA: Diagnosis present

## 2011-03-04 DIAGNOSIS — I4891 Unspecified atrial fibrillation: Secondary | ICD-10-CM | POA: Diagnosis present

## 2011-03-04 DIAGNOSIS — R1310 Dysphagia, unspecified: Secondary | ICD-10-CM | POA: Diagnosis not present

## 2011-03-04 DIAGNOSIS — I4892 Unspecified atrial flutter: Secondary | ICD-10-CM | POA: Diagnosis present

## 2011-03-04 DIAGNOSIS — Z79899 Other long term (current) drug therapy: Secondary | ICD-10-CM

## 2011-03-04 DIAGNOSIS — D62 Acute posthemorrhagic anemia: Secondary | ICD-10-CM | POA: Diagnosis not present

## 2011-03-04 DIAGNOSIS — Z91040 Latex allergy status: Secondary | ICD-10-CM

## 2011-03-04 DIAGNOSIS — I509 Heart failure, unspecified: Secondary | ICD-10-CM | POA: Diagnosis present

## 2011-03-04 DIAGNOSIS — I2782 Chronic pulmonary embolism: Secondary | ICD-10-CM | POA: Diagnosis present

## 2011-03-04 DIAGNOSIS — M21869 Other specified acquired deformities of unspecified lower leg: Principal | ICD-10-CM | POA: Diagnosis present

## 2011-03-04 DIAGNOSIS — Z7901 Long term (current) use of anticoagulants: Secondary | ICD-10-CM

## 2011-03-04 DIAGNOSIS — N390 Urinary tract infection, site not specified: Secondary | ICD-10-CM | POA: Diagnosis present

## 2011-03-04 DIAGNOSIS — I10 Essential (primary) hypertension: Secondary | ICD-10-CM | POA: Diagnosis present

## 2011-03-04 DIAGNOSIS — I428 Other cardiomyopathies: Secondary | ICD-10-CM | POA: Diagnosis present

## 2011-03-04 LAB — DIFFERENTIAL
Band Neutrophils: 1 % (ref 0–10)
Basophils Absolute: 0 10*3/uL (ref 0.0–0.1)
Basophils Relative: 0 % (ref 0–1)
Blasts: 3 %
Lymphs Abs: 7.3 10*3/uL — ABNORMAL HIGH (ref 0.7–4.0)
Metamyelocytes Relative: 0 %
Myelocytes: 0 %
Promyelocytes Absolute: 0 %

## 2011-03-04 LAB — CBC
Hemoglobin: 11.1 g/dL — ABNORMAL LOW (ref 12.0–15.0)
MCH: 27.9 pg (ref 26.0–34.0)
Platelets: 277 10*3/uL (ref 150–400)
RBC: 3.98 MIL/uL (ref 3.87–5.11)

## 2011-03-05 LAB — BASIC METABOLIC PANEL
BUN: 19 mg/dL (ref 6–23)
CO2: 27 mEq/L (ref 19–32)
Chloride: 103 mEq/L (ref 96–112)
GFR calc Af Amer: >60 mL/min (ref >60)
Potassium: 4.6 mEq/L (ref 3.5–5.1)

## 2011-03-05 LAB — CBC
HCT: 25.5 % — ABNORMAL LOW (ref 36.0–46.0)
Platelets: 252 10*3/uL (ref 150–400)
RBC: 2.92 MIL/uL — ABNORMAL LOW (ref 3.87–5.11)
RDW: 18.4 % — ABNORMAL HIGH (ref 11.5–15.5)
WBC: 11.7 10*3/uL — ABNORMAL HIGH (ref 4.0–10.5)

## 2011-03-05 LAB — PROTIME-INR: Prothrombin Time: 19.1 seconds — ABNORMAL HIGH (ref 11.6–15.2)

## 2011-03-06 DIAGNOSIS — D469 Myelodysplastic syndrome, unspecified: Secondary | ICD-10-CM

## 2011-03-06 LAB — CBC
HCT: 21.9 % — ABNORMAL LOW (ref 36.0–46.0)
Hemoglobin: 6.9 g/dL — CL (ref 12.0–15.0)
WBC: 8.3 10*3/uL (ref 4.0–10.5)

## 2011-03-06 LAB — BASIC METABOLIC PANEL
BUN: 22 mg/dL (ref 6–23)
CO2: 24 mEq/L (ref 19–32)
Chloride: 101 mEq/L (ref 96–112)
Glucose, Bld: 91 mg/dL (ref 70–99)
Potassium: 4.4 mEq/L (ref 3.5–5.1)

## 2011-03-06 LAB — URINE CULTURE
Culture  Setup Time: 201208080123
Special Requests: POSITIVE

## 2011-03-06 LAB — PROTIME-INR: INR: 2.66 — ABNORMAL HIGH (ref 0.00–1.49)

## 2011-03-06 LAB — RETICULOCYTES: Retic Count, Absolute: 65.3 10*3/uL (ref 19.0–186.0)

## 2011-03-06 LAB — LACTATE DEHYDROGENASE: LDH: 222 U/L (ref 94–250)

## 2011-03-07 LAB — TYPE AND SCREEN
ABO/RH(D): A POS
Antibody Screen: NEGATIVE
Unit division: 0
Unit division: 0

## 2011-03-07 LAB — RETICULOCYTES
RBC.: 3.08 MIL/uL — ABNORMAL LOW (ref 3.87–5.11)
Retic Count, Absolute: 64.7 10*3/uL (ref 19.0–186.0)

## 2011-03-07 LAB — CBC
Hemoglobin: 8.9 g/dL — ABNORMAL LOW (ref 12.0–15.0)
MCHC: 33.6 g/dL (ref 30.0–36.0)
RBC: 3.08 MIL/uL — ABNORMAL LOW (ref 3.87–5.11)
WBC: 9.2 10*3/uL (ref 4.0–10.5)

## 2011-03-08 DIAGNOSIS — D469 Myelodysplastic syndrome, unspecified: Secondary | ICD-10-CM

## 2011-03-08 DIAGNOSIS — D649 Anemia, unspecified: Secondary | ICD-10-CM

## 2011-03-08 DIAGNOSIS — Z96659 Presence of unspecified artificial knee joint: Secondary | ICD-10-CM

## 2011-03-08 LAB — CBC
MCH: 29.2 pg (ref 26.0–34.0)
MCV: 87.6 fL (ref 78.0–100.0)
Platelets: 244 10*3/uL (ref 150–400)
RBC: 3.22 MIL/uL — ABNORMAL LOW (ref 3.87–5.11)
RDW: 17.4 % — ABNORMAL HIGH (ref 11.5–15.5)

## 2011-03-09 LAB — CBC
MCH: 29.5 pg (ref 26.0–34.0)
MCV: 87.6 fL (ref 78.0–100.0)
Platelets: 223 10*3/uL (ref 150–400)
RBC: 2.98 MIL/uL — ABNORMAL LOW (ref 3.87–5.11)

## 2011-03-09 LAB — BASIC METABOLIC PANEL
BUN: 12 mg/dL (ref 6–23)
CO2: 27 mEq/L (ref 19–32)
Chloride: 103 mEq/L (ref 96–112)
GFR calc non Af Amer: >60 mL/min (ref >60)
Glucose, Bld: 85 mg/dL (ref 70–99)
Potassium: 4.1 mEq/L (ref 3.5–5.1)

## 2011-03-10 ENCOUNTER — Inpatient Hospital Stay (HOSPITAL_COMMUNITY): Payer: Medicare Other

## 2011-03-10 DIAGNOSIS — D469 Myelodysplastic syndrome, unspecified: Secondary | ICD-10-CM

## 2011-03-10 LAB — PROTIME-INR: INR: 1.39 (ref 0.00–1.49)

## 2011-03-10 LAB — ERYTHROPOIETIN: Erythropoietin: 29.5 m[IU]/mL (ref 2.6–34.0)

## 2011-03-10 NOTE — Op Note (Signed)
NAMEMarland Kitchen  [name], [name] NO.:  [id number]  MEDICAL RECORD NO.:  [id number]  LOCATION:  1617                         FACILITY:  [facility]  PHYSICIAN:  Madlyn Frankel. Charlann Boxer, M.D.  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  PROCEDURE:  Reimplantation/revision of the left total hip replacement.  SURGEON:  Madlyn Frankel. Charlann Boxer, M.D.  ASSISTANT:  Lanney Gins, PA  COMPONENTS USED:  DePuy knee revision system utilizing a size 3 TC3 femur with a 5-degree bolt with 0-degree adapter, a 13 x 30 mm stem, 4 mm distal, medial, and lateral augments and 4 mm posterior medial augment, a size 3 MBT revision tray with 13 x 30 stem, a 5 mm medial and lateral augment.  No patella was reimplanted due to lack of bone stock.  I did use GHV cement, the gentamicin-impregnated cement with cement restrictors.  ANESTHESIA:  General.  COMPLICATIONS:  None.  SPECIMEN:  None.  DRAINS:  One Hemovac.  TOURNIQUET TIME:  69 minutes at 250 mmHg.  INDICATIONS FOR PROCEDURE:  [name] is a 77 year old female with a history of total knee replacement on the left side.  She presented to see her primary surgeon, Dr. Worthy Rancher, with swelling in the left knee.  The knee was aspirated revealing elevated white cells.  She was subsequently taken to the operating room and had her knee resected. Cultures were negative, but she was treated for infection with IV antibiotics.  She, at this point, scheduled for reimplantation.  Risks and benefits were reviewed and surgery was scheduled.  Consent was obtained.  PROCEDURE IN DETAIL:  The patient was brought to the operative theater. Once adequate anesthesia, preoperative antibiotics administered, she  was positioned supine with left thigh tourniquet placed.  The left lower extremity was prepped and draped in the sterile fashion.  Time-out was performed identifying the patient, planned procedure, and extremity. Her old incision was identified and incised, soft tissue planes created. Once the initial soft tissue plane created, I did irrigate this portion out of any fibrinous debris.  Arthrotomy was made encountering a seroma-type fluid in the knee.  There was no signs of purulence.  Initial procedure was carried out for exposure purposes, clearly defined landmarks and planes.  No Quad snip was required.  Once medial and lateral gutters were established, we identified that the patella was extraordinarily thin and would not be revised.  Attention was now directed to the preparation of femur and the tibia. Femoral and tibial canals were identified and reamed to 15 mm by hand. I then used a canal brush, irrigated out and debrided the canal.  Attention was first directed to the tibia.  Using extramedullary guide, I freshened up the proximal portion of the tibia.  She was noted to have some anteromedial bone flap as well from previous cement removal.  The cut surface at this point to the best fit was size 3 tibial tray.  The tray was pinned  into position, drilled for MBT tray.  I then used medial and lateral augments from previous bone resection.  A trial component was then prepared with medial and lateral 5 mm augments and MBT tray.  This was tamped into position.  Please note that I did use a 29 mm cemented sleeve.  This was prepared by broaching.  With the tibial tray in place and noting that it was perpendicular in the coronal plane and in line with the tibial spine, attention was now directed to the femur.  With an intramedullary rod in the femur and with the cylindrical aperture devices keeping the stem in place, a distal cutting block was positioned.  With this cutting block  freshened up the cut just a bit, I decided out to use 4 mm medial and lateral augments to restore joint line.  I then sized the femur to be a size 3.  The size 3 block was then pinned in position using a 0=degree block with minimal resection of bone on the anterior aspect of femur medially of the 4 mm cut and removed just a little bit of bone and on the lateral side, no augment was necessary. Following debridement in the posterior aspect of the knee after it was cut, the chamfer cuts and box cut was made for TC3 component.  Following this, the trial reduction was now carried out with the above- stated femoral component and tibial tray, 10 mm was used.  With this insert, I felt that the knee came out to a little bit of hyperextension. There was noted to be a small amount of avulsion off the medial epicondyle of the MCL insertion of this, but I felt this was stable.  I was planning to use a TC3 component any way.  At this point, all trial components were removed.  Final components were opened as noted, except for the polyethylene insert.  The knee was copiously irrigated at this point with normal saline solution, up to 3 L total used.  After final debridements, 4 batches of gentamicin- impregnated cement were mixed.  The final components were made on the back table by myself as noted.  The final components were cemented onto clean, dry  cutting surface of the bone, the tibia first, then the femur.  The knee was brought to extension with a 12.5 insert and extruded cement was removed.  Once the cement had fully cured, the final 12.5 insert was chosen and a TC3 component.  The insert was then placed, the knee was reduced, the knee was re-irrigated with another 500 cc of normal saline solution.  The tourniquet had been let down 69 minutes without significant hemostasis required.  The medium Hemovac drain was placed.  The extensor mechanism was then reapproximated using #1 Vicryl, the knee  in extension; however, soft tissue planes were not perfectly identifiable based on previous surgeries.  However, they were reapproximated as best could be possible, particularly this medial retinacular tissue distally.  The remainder of the wound was closed with 2-0 Vicryl and staples on the skin.  The skin was cleaned, dried, and dressed sterilely using a Aquacel dressing.  The knee was wrapped into a sterile dressing otherwise.     Madlyn Frankel Charlann Boxer, M.D.     MDO/MEDQ  D:  03/05/2011  T:  03/05/2011  Job:  [id number]  Electronically Signed by Durene Romans M.D. on 03/10/2011 07:37:37 AM

## 2011-03-11 LAB — RETICULOCYTES
RBC.: 3.09 MIL/uL — ABNORMAL LOW (ref 3.87–5.11)
Retic Count, Absolute: 68 10*3/uL (ref 19.0–186.0)
Retic Ct Pct: 2.2 % (ref 0.4–3.1)

## 2011-03-11 LAB — URINALYSIS, ROUTINE W REFLEX MICROSCOPIC
Bilirubin Urine: NEGATIVE
Hgb urine dipstick: NEGATIVE
Ketones, ur: NEGATIVE mg/dL
Nitrite: NEGATIVE
Urobilinogen, UA: 0.2 mg/dL (ref 0.0–1.0)

## 2011-03-11 LAB — CBC
Hemoglobin: 9.1 g/dL — ABNORMAL LOW (ref 12.0–15.0)
RBC: 3.09 MIL/uL — ABNORMAL LOW (ref 3.87–5.11)
WBC: 9.8 10*3/uL (ref 4.0–10.5)

## 2011-03-11 LAB — PROTIME-INR
INR: 1.24 (ref 0.00–1.49)
Prothrombin Time: 15.9 seconds — ABNORMAL HIGH (ref 11.6–15.2)

## 2011-03-12 LAB — CBC
HCT: 27.2 % — ABNORMAL LOW (ref 36.0–46.0)
Hemoglobin: 8.9 g/dL — ABNORMAL LOW (ref 12.0–15.0)
MCHC: 32.7 g/dL (ref 30.0–36.0)
MCV: 88.9 fL (ref 78.0–100.0)
RDW: 17.7 % — ABNORMAL HIGH (ref 11.5–15.5)

## 2011-03-14 LAB — URINE CULTURE
Culture  Setup Time: 201208140911
Special Requests: NEGATIVE

## 2011-03-14 NOTE — Discharge Summary (Signed)
[name], [name] NO.:  [id number]  MEDICAL RECORD NO.:  [id number]  LOCATION:  1617                         FACILITY:  [facility]  PHYSICIAN:  Madlyn Frankel. Charlann Boxer, M.D.  DATE OF BIRTH:  09/04/1914  DATE OF ADMISSION:  03/04/2011 DATE OF DISCHARGE:  03/12/2011                        DISCHARGE SUMMARY - REFERRING   PROCEDURES:  Removal of antibiotic spacer and left total knee arthroplasty revision.  ADMITTING DIAGNOSIS:  Antibiotic spacer status post infected left total knee arthroplasty.  DISCHARGE DIAGNOSES: 1. Status post left total knee revision. 2. Nonischemic cardiomyopathy. 3. Atrial fibrillation/flutter. 4. History of pulmonary embolism. 5. Hypertension. 6. Acute blood loss anemia postoperatively.  HISTORY OF PRESENT ILLNESS:  The patient is a 77 year old white lady who originally had a left total knee arthroplasty 12 plus years ago.  The patient did well with this about 9 years ago; however, the patient did started having some problems with pain in her left knee.  The knee was I and D'd, which showed staph infection, this was treated and the patient was doing well until a couple months ago when the patient presented to the emergency room May 2012 complaining of tenderness and warmth of her left knee.  An aspirate of the left knee showed staph.  X-rays of the left knee were taken which revealed loosening of the femoral components which were of the poly tibial liner.  The patient brought to the operating room by Dr. Tinnie Gens on June 2nd, where components of the total knee arthroplasty were removed.  This was replaced with an antibiotic spacer.  There were no signs of pus when this occurred.  The patient had been recently followed by Medicine where she had anemia post surgically and she had received 4 units of blood products recently to treat her anemia.  Dr. Charlann Boxer was consulted.  Options were discussed.  The patient wished to proceed with replacement of  antibiotic spacer with the revision of the left total knee.  Risks, benefits, and expectations of the procedure were discussed with the patient.  The patient understands risks, benefits, and expectations and wished to proceed.  HOSPITAL COURSE:  The patient underwent the above-stated procedure on March 04, 2011.  The patient tolerated the procedure well, was brought to the recovery room in good condition, subsequently to the floor.  The patient initially had low urine output while in the hospital with potential urinary tract infection, hence the patient's doxycycline was continued.  The patient's H and H dropped while in the hospital down to hemoglobin was 5.6 at one point.  Hematology was consulted to monitor both the blood and UTI that she had that time.  The patient was diagnosed with acute blood loss anemia status postoperative, was transfused with 2 units of packed red blood cells.  Dr. Myna Hidalgo treat her for these.  The patient progressively improved throughout her visit. The patient was found to have pseudomonas in her urine and was treated for this with [place].  This was discontinued after cleared up.  The patient was put on Procrit to help with her anemia.  The patient was also Lovenox and Coumadin to prevent DVT suggested by pharmacy prior to visit and trying to get to therapeutic  range by March 12, 2011, postop day #8 of left total knee revision.  The patient was doing well enough, no events, afebrile, vital signs stable.  Hemoglobin and hematocrit 8.9/27.2.  Dressings clean, dry and intact.  She is distally neurovascularly intact.  It has been cared by Dr. Myna Hidalgo, to be discontinued from an orthopedic standpoint as well.  DISCHARGE CONDITION:  Good.  DISCHARGE INSTRUCTIONS:  The patient will be discharged to a friend's home today March 12, 2011.  The patient is to be weightbearing as tolerated.  The patient should wear a knee immobilizer whenever she is out of bed and  active.  While on bed, she can go without knee immobilizer.  The patient is to keep her dressings dry and clean.  It can be changed out daily with gauze and tape.  The patient will follow up with Dr. Charlann Boxer at [facility] in 2 weeks.  The patient will follow up with Dr. Myna Hidalgo also in about 2 weeks.  DISCHARGE MEDICATIONS: 1. Coumadin 5 mg 1 p.o. daily to be monitored by the facility     pharmacy, should maintain an INR 2 and 3. 2. Colace 100 mg 1 p.o. b.i.d. p.r.n. constipation. 3. Folic acid 2 mg p.o. daily. 4. Norco 5/325 mg 1-2 p.o. q.4-6 h. p.r.n. pain. 5. Metoprolol 12.5 mg p.o. daily. 6. Claritin 10 mg 1 p.o. daily. 7. Iron sulfate 325 mg 1 p.o. t.i.d. for 2 to 3 weeks. 8. Furosemide 20 mg 1 p.o. q.a.m. 9. Mag-Ox 400 one p.o. q.h.s. 10.Robaxin 500 mg 1 p.o. q.6 h. p.r.n. muscle spasms. 11.Metoprolol titrate 25 mg half p.o. q.a.m. 12.Potassium chloride 10 mEq 1 p.o. q.a.m. 13.Senokot 8.6 mg 1 p.o. b.i.d.    ______________________________ Lanney Gins, PA   ______________________________ Madlyn Frankel. Charlann Boxer, M.D.    MB/MEDQ  D:  03/12/2011  T:  03/12/2011  Job:  [id number]  Electronically Signed by Lanney Gins PA on 03/13/2011 09:27:49 AM Electronically Signed by Durene Romans M.D. on 03/14/2011 08:58:52 PM

## 2011-03-17 NOTE — H&P (Signed)
NAMEMarland Kitchen  RAKIA, FRAYNE NO.:  [id number]  MEDICAL RECORD NO.:  [id number]  LOCATION:  1617                         FACILITY:  [facility]  PHYSICIAN:  Madlyn Frankel. Charlann Boxer, M.D.  DATE OF BIRTH:  03/02/1915  DATE OF ADMISSION:  03/04/2011 DATE OF DISCHARGE:                             HISTORY & PHYSICAL   DIAGNOSIS:  Antibiotic spacer status post infected left total knee arthroplasty.  HISTORY OF PRESENT ILLNESS:  Patient is a 77 year old white female who originally had a left total knee arthroplasty, 12+ years ago.  Patient did well with this.  About 9 years ago, patient did start having some problems with pain in the left knee.  The knee was I and D'd, which showed Staph infection, this was treated.  Patient was doing well until about a couple of months ago.  Patient did present to the emergency room in May of 2012 complaining of tenderness and warmth of the left knee.  An aspirate of the left knee showed Staph.  X-rays of the left knee were taken, which revealed loosening of the femoral components with complete wear of the poly tibial liner.  Patient was brought to the emergency room by Dr. Tinnie Gens on June 2 where the components of the total knee arthroplasty were removed.  This was replaced with an antibiotic spacer. There was no signs of pus when this was done.  Patient has been followed by Medicine recently for anemia, postsurgical.  Patient has received 4 units of blood products to help with her anemia.  Dr. Charlann Boxer was consulted. Options were discussed.  Patient wishes to proceed with replacing the antibiotics and space her with a revision of the total knee.  Risks, benefits and expectations of this procedure was discussed. Patient understands risks, benefits, and expectations and wishes to proceed.  PAST MEDICAL HISTORY: 1. Nonischemic cardiomyopathy. 2. A-Fib/flutter. 3. History of PE. 4. Hypertension.  PAST SURGICAL HISTORY:  Left TKA 12+ years  ago.  ALLERGIES: 1. AMOXICILLIN. 2. DILTIAZEM. 3. AMIODARONE. 4. TOPROL-XL. 5. METRONIDAZOLE. 6. CIPRO. 7. CODEINE. 8. PENICILLIN. 9. LATEX.  MEDICATIONS: 1. Florastor 250 mg p.o. 2 capsules q.a.m. 2. Percocet 5/325 two q.6 hours p.r.n. pain. 3. Methocarbamol 500 mg one p.o. q.6 hours p.r.n. 4. Tylenol 500 mg two tablets q.8 hours p.r.n. pain. 5. Arginate supplements one packet twice a day. 6. Coumadin 10 mg p.o. 7. Magnesium oxide 400 mg p.o. daily. 8. Ocuvite beta p.o. 1 tablet q.h.s. 9. Iron sulfate 325 mg one p.o. t.i.d. after meals. 10.Os-Cal 500 mg one p.o. b.i.d. 11.Multaq 400 mg one p.o. b.i.d. 12.Senokot 8.6 mg one p.o. b.i.d. 13.Mucinex XR 600 mg one p.o. b.i.d. 14.Potassium chloride 10 mEq p.o. q. day. 15.Metoprolol 25 mg half tablet every morning. 16.Furosemide 20 mg one p.o. q. day. 17.Claritin 10 mg q. day. 18.Heparin flush per PICC line. 19.Certa-Vite-antioxidants 18/0.4 mg q.a.m.  REVIEW OF SYSTEMS:  Pain in the left knee, otherwise unremarkable.  PHYSICAL EXAMINATION:  GENERAL:  Patient is a 77 year old white female, in no acute distress. HEENT:  Pupils equal, round, reactive well to light and accommodation. Throat is clear. NECK:  Supple. CARDIAC:  S1, S2.  No murmur appreciated. RESPIRATORY:  Lungs are clear to  auscultation bilaterally. ORTHO:  As pertaining to the left knee, patient does have a scar of the left knee.  Patient does have a strain.  He, at this time, is unable to bend because of the antibiotic spacer, especially with pain in the knee. Patient is distally neurovascularly intact.  IMPRESSION:  Left knee antibiotic spacer status post infected left total arthroplasty.  PLAN:  Patient will be admitted to the hospital to undergo revision of left total knee arthroplasty per Dr. Charlann Boxer on March 04, 2011.    ______________________________ Lanney Gins, PA   ______________________________ Madlyn Frankel. Charlann Boxer, M.D.    MB/MEDQ  D:   03/05/2011  T:  03/06/2011  Job:  [id number]  Electronically Signed by Lanney Gins PA on 03/17/2011 09:57:20 AM Electronically Signed by Durene Romans M.D. on 03/17/2011 05:35:31 PM

## 2011-03-21 NOTE — Op Note (Signed)
  NAME:  [name], [name] NO.:  [id number]  MEDICAL RECORD NO.:  [id number]  LOCATION:                                 FACILITY:  PHYSICIAN:  Madlyn Frankel. Charlann Boxer, M.D.  DATE OF BIRTH:  1914/10/30  DATE OF PROCEDURE: DATE OF DISCHARGE:                              OPERATIVE REPORT   ADDENDUM:  For previous job #[id number].  This is an operative note addendum.  The patient underwent a left total knee revision/reimplantation  following resection for an infected left total knee.  It was previously dictated that it was a left hip.  This is a left total knee reimplantation and remainder of the dictation should reflect this change.     Madlyn Frankel Charlann Boxer, M.D.     MDO/MEDQ  D:  03/19/2011  T:  03/20/2011  Job:  [id number]  Electronically Signed by Durene Romans M.D. on 03/21/2011 07:56:41 AM

## 2011-03-25 ENCOUNTER — Other Ambulatory Visit: Payer: Self-pay | Admitting: Hematology & Oncology

## 2011-03-25 ENCOUNTER — Encounter (HOSPITAL_BASED_OUTPATIENT_CLINIC_OR_DEPARTMENT_OTHER): Payer: Medicare Other | Admitting: Hematology & Oncology

## 2011-03-25 DIAGNOSIS — D649 Anemia, unspecified: Secondary | ICD-10-CM

## 2011-03-25 DIAGNOSIS — D47Z9 Other specified neoplasms of uncertain behavior of lymphoid, hematopoietic and related tissue: Secondary | ICD-10-CM

## 2011-03-25 DIAGNOSIS — C9210 Chronic myeloid leukemia, BCR/ABL-positive, not having achieved remission: Secondary | ICD-10-CM

## 2011-03-25 DIAGNOSIS — N289 Disorder of kidney and ureter, unspecified: Secondary | ICD-10-CM

## 2011-03-25 LAB — MANUAL DIFFERENTIAL (CHCC SATELLITE)
ALC: 7.8 10*3/uL — ABNORMAL HIGH (ref 0.6–2.2)
ANC (CHCC HP manual diff): 4.7 10*3/uL (ref 1.5–6.7)
Eos: 3 % (ref 0–7)
LYMPH: 58 % — ABNORMAL HIGH (ref 14–48)
MONO: 4 % (ref 0–13)

## 2011-03-25 LAB — CMP (CANCER CENTER ONLY)
Albumin: 2.7 g/dL — ABNORMAL LOW (ref 3.3–5.5)
BUN, Bld: 10 mg/dL (ref 7–22)
Calcium: 8.9 mg/dL (ref 8.0–10.3)
Chloride: 97 mEq/L — ABNORMAL LOW (ref 98–108)
Glucose, Bld: 149 mg/dL — ABNORMAL HIGH (ref 73–118)
Potassium: 4.7 mEq/L (ref 3.3–4.7)

## 2011-03-25 LAB — CBC WITH DIFFERENTIAL (CANCER CENTER ONLY)
HCT: 32.5 % — ABNORMAL LOW (ref 34.8–46.6)
HGB: 10.8 g/dL — ABNORMAL LOW (ref 11.6–15.9)
MCH: 29.9 pg (ref 26.0–34.0)
MCHC: 33.2 g/dL (ref 32.0–36.0)
RDW: 19 % — ABNORMAL HIGH (ref 11.1–15.7)

## 2011-04-01 LAB — BETA 2 MICROGLOBULIN, SERUM: Beta-2 Microglobulin: 4.14 mg/L — ABNORMAL HIGH (ref 1.01–1.73)

## 2011-04-01 LAB — RETICULOCYTES (CHCC): RBC.: 3.75 MIL/uL — ABNORMAL LOW (ref 3.87–5.11)

## 2011-04-01 LAB — PROTEIN ELECTROPHORESIS, SERUM
Alpha-2-Globulin: 14.6 % — ABNORMAL HIGH (ref 7.1–11.8)
Beta Globulin: 5.1 % (ref 4.7–7.2)
Gamma Globulin: 10.7 % — ABNORMAL LOW (ref 11.1–18.8)

## 2011-04-01 LAB — IRON AND TIBC
%SAT: 15 % — ABNORMAL LOW (ref 20–55)
Iron: 24 ug/dL — ABNORMAL LOW (ref 42–145)
TIBC: 157 ug/dL — ABNORMAL LOW (ref 250–470)
UIBC: 133 ug/dL (ref 125–400)

## 2011-04-03 NOTE — Discharge Summary (Signed)
NAME:  Tricia, Jacobs NO.:  [id number]  MEDICAL RECORD NO.:  [id number]  LOCATION:                                 FACILITY:  PHYSICIAN:  Ramiro Harvest, MD    DATE OF BIRTH:  23-Jul-1915  DATE OF ADMISSION: DATE OF DISCHARGE:                              DISCHARGE SUMMARY   PRIMARY CARE PHYSICIANS:  Dr. Arlys John MacKenzie/Dr. Murray Hodgkins.  ONCOLOGIST:  Rose Phi. Myna Hidalgo, M.D.  CARDIOLOGIST:  Richard A. Alanda Amass, M.D. of [facility] and Vascular.  DISCHARGE DIAGNOSES: 1. Anemia status post 4 units packed red blood cells. 2. Hypokalemia repleted. 3. Asymptomatic bradycardia. 4. Nonischemic cardiomyopathy/chronic systolic heart failure, EF     improved during this hospitalization. 5. Paroxysmal atrial fibrillation. 6. Atrial flutter. 7. Acute renal failure, resolved. 8. Probable CLL being followed as an outpatient per Dr. Myna Hidalgo. 9. Paroxysmal atrial fibrillation/atrial flutter. 10.Status post left total knee arthroplasty with a spacer status post     removal of hardware secondary to infection, being followed as an     outpatient per Dr. Charlann Boxer and scheduled for surgery next week. 11.History of pulmonary embolism. 12.Hypertension. 13.Moderate pulmonary hypertension.  DISCHARGE MEDICATIONS: 1. Acetaminophen 500 mg 2 tablets p.o. q.8 h. p.r.n. 2. Arginaid supplement mix with 8 ounces of water 1 packet twice     daily. 3. Certa-Vite 18 mg/0.4 mg 1 tablet p.o. q.a.m. 4. Claritin 10 mg, was completed on February 18, 2011. 5. Iron sulfate 325 mg p.o. t.i.d. with meals. 6. Lasix 20 mg p.o. q.a.m. 7. Heparin flush 3 cc IV q.a.m. through the PICC line. 8. Magnesium oxide 400 mg p.o. q.h.s. 9. Methocarbamol 500 mg p.o. q.6 h. p.r.n. 10.Metoprolol 25 mg half a tablet p.o. q.a.m. 11.Mucinex 600 mg p.o. b.i.d. completed on February 09, 2011. 12.Ocuvite 1 tablet p.o. q.h.s. 13.Os-Cal 500 1 tablet p.o. b.i.d. 14.Oxycodone and acetaminophen 5/325 mg 2 tablets p.o.  q.6 h. p.r.n.     pain. 15.Potassium chloride 10 mEq p.o. q.a.m. 16.Senokot 8.6 mg p.o. b.i.d. before meals.  DISPOSITION AND FOLLOWUP:  The patient will be discharged to a skilled nursing facility.  The patient is to follow up with orthopedics as scheduled for her surgery next week.  The patient is to follow up with Dr. Alanda Amass in 1-2 weeks post discharge, to follow up on asymptomatic bradycardia and paroxysmal AFib/atrial flutter as her Multaq has been discontinued and she was just been maintained for rate control on metoprolol.  The patient is to follow up with PCP in 1-2 weeks per MD at the skilled nursing facility.  The patient will need a BMET and a CBC checked 1 week post discharge.  CONSULTATIONS DONE:  A cardiology consultation was done.  The patient was seen in consultation by Dr. Charise Killian of [facility] and Vascular on February 27, 2011.  PROCEDURES PERFORMED:  The patient was transfused a total of 4 units of packed red blood cells during this hospitalization.  Chest x-ray was done on February 24, 2011, that showed stable cardiomegaly and COPD.  No acute findings.  A 2-D echo was done on February 27, 2011, that showed a normal left ventricular cavity size, systolic function was normal, EF of 60%-65%,  wall motion was normal.  There were no regional wall motion abnormalities.  Doppler parameters consistent with abnormal left ventricular relaxation, grade 1 diastolic dysfunction.  There was mild aortic valvular stenosis, valve area of 1.61 cm2, now calcified mitral valvular annulus, right ventricle was mildly dilated, wall thickness was normal, right atrium was mildly dilated.  There was no PFO, moderate tricuspid valvular regurgitation, PA peak pressure of 34 mmHg, extracardiac pericardium had a trivial pericardial effusion.  BRIEF ADMISSION HISTORY AND PHYSICAL:  [name] is a 77 year old female who was brought from the [facility] after the patient had been  complaining of some dizziness over the past 3-4 days prior to admission.  The patient did not pass out and she did not fall.  The patient was able to provide some history.  The patient was not confused at that time.  The patient stated that she did not have any chest pain or shortness of breath, but was only dizzy.  Lab and tests which were performed at the nursing home showed a hemoglobin of 5.8 and as such the patient was sent to [facility] for further evaluation and management.  The patient had a history of atrial flutter/paroxysmal AFib and had been on Coumadin.  Stool occult blood in the ED was negative for blood.  The patient did have a history of infected left total knee arthroplasty and underwent debridement of the area done per Dr. Darrelyn Hillock on December 28, 2010.  The patient was discharged on vancomycin.  Completed the course of vancomycin on February 11, 2011, which was confirmed with daughter.  The patient was not taking vancomycin at this time.  The patient was supposed to go for another knee surgery March 05, 2011, per Dr. Herbert Spires. Darrelyn Hillock.  For the rest of admission history and physical, please see H and P dictated by Dr. Sharl Ma of job number [id number].  HOSPITAL COURSE: 1. Anemia.  The patient was admitted with anemia.  It was felt her     symptoms were likely secondary to symptomatic anemia.  Her baseline     hemoglobin was approximately around 8.5 when she was discharged on     December 28, 2010.  The patient was typed and crossed.  FOBT which was     checked was negative for any acute blood loss.  The patient did not     have any overt GI bleed.  Anemia panel which was done was     consistent with anemia of chronic disease.  The patient was typed     and crossed, transfused initially 2 units of packed red blood cells     with appropriate response.  The patient improved clinically and     remained in stable condition.  A peripheral smear which was done     did show immature  blast-like cells.  The patient was subsequently     transfused another 2 units of packed red blood cells as the     hemoglobin came up into the 7s in anticipation of her surgery next     week.  The patient tolerated extra 2 units of packed red blood     cells with no complications and had improved clinically.  The     patient's daughter did state that the patient was diagnosed with     leukemia and was being followed as an outpatient by Dr. Myna Hidalgo.     It was felt the patient's anemia was likely related to a probable  CLL.  The patient is to follow up with Dr. Myna Hidalgo as an outpatient     for further evaluation and management of anemia.  The patient will     be discharged in stable and improved condition. 2. Hypokalemia.  During the hospitalization, the patient was noted to     be hypokalemic.  The patient's potassium was repleted, will need to     be followed up as an outpatient. 3. Asymptomatic bradycardia.  During the hospitalization, the patient     was noted to have heart rate to dip down into the 35s-40s.  The     patient remained asymptomatic.  Her bradycardia was sustaining and     as such her Multaq and beta-blocker which was on for rate control     for atrial flutter and AFib were discontinued.  The patient's     bradycardia subsequently improved and resolved off the Multaq and     metoprolol.  Cardiac enzymes which were cycled were negative x3.     The patient did not have any chest pain.  2-D echo was also     obtained with results as stated above.  A cardiology consultation     was done.  The patient was seen in consultation by Dr. Charise Killian of     [facility] and Vascular.  It was felt that the patient did     have a right bundle branch block and left anterior fascicular     block, which were consistent with extensive conduction system     disease.  The patient had remained in normal sinus rhythm.  During     the hospitalization, it was felt that the patient could  not receive     antiarrhythmic treatment without a backup pacemaker.  It was felt     that since the patient's AFib was very infrequent, it was     recommended to monitor the patient and it was felt that if the     patient's AFib recur greater than twice a year, she may need to     dual-chamber pacemaker as well as antiarrhythmic.  It was     recommended to discontinue the patient's Multaq and restart the     patient on low-dose metoprolol.  The patient was started on     metoprolol 12.5 mg daily, which she will be discharged to home on.     She will need to follow up with her cardiologist as an outpatient     for further evaluation and management. 4. Acute renal failure.  On admission, the patient was noted to be in     acute renal insufficiency.  It was felt this was likely secondary     to a prerenal azotemia, which improved and had resolved by day of     discharge.  Her creatinine was down to 0.75. 5. Paroxysmal AFib/atrial flutter.  The patient did have a history of     paroxysmal AFib/atrial flutter, which was very infrequent due to     bradycardia.  Her Multaq and metoprolol were held for several days     during the hospitalization and cardiology consultation was     obtained.  It was felt that the patient's AFib was infrequent.     During the hospitalization, the patient remained in normal sinus     rhythm and was recommended to monitor the patient if her AFib recur     greater than twice per year, it was recommended  that the patient     will need a dual-chamber pacemaker and antiarrhythmics.  It was     recommended to place the patient on a on low-dose beta blocker for     rate control and not to resume her Multaq.  The patient's Coumadin     was held during this hospitalization.  FOBT was negative.  We will     continue to be held as the patient is in anticipation of surgery     early next week.  The patient remained in stable condition.  The     rest of the patient's chronic  medical issues remained stable and     the patient was discharged in stable and improved condition.  On     day of discharge vital signs, temperature 97.7, pulse of 65, blood     pressure 130/78, respirations 16, satting 99% on 2 L.  DISCHARGE LABS:  Sodium 135, potassium 3.5, chloride 100, bicarb 32, BUN 14, creatinine 0.75, glucose of 93, calcium of 8.9.  CBC with a white count of 5.8, hemoglobin 10, hematocrit 29.4, platelets of 159, ANC of 0.4, PT of 17.6, INR of 1.42.  It has been a pleasure taking care of Ms. Capps.     Ramiro Harvest, MD     DT/MEDQ  D:  02/28/2011  T:  02/28/2011  Job:  [id number]  cc:   Madlyn Frankel Charlann Boxer, M.D. Fax: [phone]  Thayer Headings, M.D. Fax: [phone]  Lenon Curt. Chilton Si, M.D. Fax: [phone]  Josph Macho, M.D. Fax: [phone]  Richard A. Alanda Amass, M.D. Fax: [phone]  Electronically Signed by Ramiro Harvest MD on 04/03/2011 12:04:04 PM

## 2011-04-10 ENCOUNTER — Encounter (HOSPITAL_BASED_OUTPATIENT_CLINIC_OR_DEPARTMENT_OTHER): Payer: Medicare Other | Admitting: Hematology & Oncology

## 2011-04-10 DIAGNOSIS — N289 Disorder of kidney and ureter, unspecified: Secondary | ICD-10-CM

## 2011-04-10 DIAGNOSIS — D649 Anemia, unspecified: Secondary | ICD-10-CM

## 2011-04-23 ENCOUNTER — Ambulatory Visit (HOSPITAL_BASED_OUTPATIENT_CLINIC_OR_DEPARTMENT_OTHER): Payer: Medicare Other | Admitting: Hematology & Oncology

## 2011-04-23 ENCOUNTER — Other Ambulatory Visit: Payer: Self-pay | Admitting: Hematology & Oncology

## 2011-04-23 DIAGNOSIS — N289 Disorder of kidney and ureter, unspecified: Secondary | ICD-10-CM

## 2011-04-23 DIAGNOSIS — D649 Anemia, unspecified: Secondary | ICD-10-CM

## 2011-04-23 DIAGNOSIS — D47Z9 Other specified neoplasms of uncertain behavior of lymphoid, hematopoietic and related tissue: Secondary | ICD-10-CM

## 2011-04-23 DIAGNOSIS — N039 Chronic nephritic syndrome with unspecified morphologic changes: Secondary | ICD-10-CM

## 2011-04-23 DIAGNOSIS — C9210 Chronic myeloid leukemia, BCR/ABL-positive, not having achieved remission: Secondary | ICD-10-CM

## 2011-04-23 DIAGNOSIS — N189 Chronic kidney disease, unspecified: Secondary | ICD-10-CM

## 2011-04-23 DIAGNOSIS — D509 Iron deficiency anemia, unspecified: Secondary | ICD-10-CM

## 2011-04-23 LAB — MANUAL DIFFERENTIAL (CHCC SATELLITE)
Eos: 2 % (ref 0–7)
MONO: 4 % (ref 0–13)
Other Cells: 7 % — ABNORMAL HIGH (ref 0–0)

## 2011-04-23 LAB — CBC WITH DIFFERENTIAL (CANCER CENTER ONLY)
HCT: 37.6 % (ref 34.8–46.6)
MCH: 29.8 pg (ref 26.0–34.0)
MCHC: 33.8 g/dL (ref 32.0–36.0)
MCV: 88 fL (ref 81–101)
RDW: 17.2 % — ABNORMAL HIGH (ref 11.1–15.7)

## 2011-04-23 LAB — IRON AND TIBC
%SAT: 17 % — ABNORMAL LOW (ref 20–55)
TIBC: 173 ug/dL — ABNORMAL LOW (ref 250–470)

## 2011-04-23 LAB — RETICULOCYTES (CHCC): Retic Ct Pct: 0.8 % (ref 0.4–2.3)

## 2011-05-09 LAB — BASIC METABOLIC PANEL
BUN: 17
CO2: 27
Chloride: 108
Creatinine, Ser: 1.12
Glucose, Bld: 135 — ABNORMAL HIGH

## 2011-05-09 LAB — CBC
MCHC: 33.8
MCV: 82.7
Platelets: 165

## 2011-05-09 LAB — PROTIME-INR: Prothrombin Time: 15.2

## 2011-06-05 ENCOUNTER — Other Ambulatory Visit (HOSPITAL_BASED_OUTPATIENT_CLINIC_OR_DEPARTMENT_OTHER): Payer: Medicare Other | Admitting: Lab

## 2011-06-05 ENCOUNTER — Other Ambulatory Visit: Payer: Self-pay | Admitting: Hematology & Oncology

## 2011-06-05 ENCOUNTER — Ambulatory Visit (HOSPITAL_BASED_OUTPATIENT_CLINIC_OR_DEPARTMENT_OTHER): Payer: Medicare Other | Admitting: Hematology & Oncology

## 2011-06-05 DIAGNOSIS — D509 Iron deficiency anemia, unspecified: Secondary | ICD-10-CM

## 2011-06-05 DIAGNOSIS — N289 Disorder of kidney and ureter, unspecified: Secondary | ICD-10-CM

## 2011-06-05 DIAGNOSIS — D649 Anemia, unspecified: Secondary | ICD-10-CM

## 2011-06-05 DIAGNOSIS — D47Z9 Other specified neoplasms of uncertain behavior of lymphoid, hematopoietic and related tissue: Secondary | ICD-10-CM

## 2011-06-05 DIAGNOSIS — N189 Chronic kidney disease, unspecified: Secondary | ICD-10-CM

## 2011-06-05 DIAGNOSIS — N039 Chronic nephritic syndrome with unspecified morphologic changes: Secondary | ICD-10-CM

## 2011-06-05 LAB — CBC WITH DIFFERENTIAL (CANCER CENTER ONLY)
Eosinophils Absolute: 0.2 10*3/uL (ref 0.0–0.5)
HCT: 35.9 % (ref 34.8–46.6)
LYMPH%: 49.3 % — ABNORMAL HIGH (ref 14.0–48.0)
MCH: 29.5 pg (ref 26.0–34.0)
MCV: 87 fL (ref 81–101)
MONO%: 9.9 % (ref 0.0–13.0)
NEUT%: 36.6 % — ABNORMAL LOW (ref 39.6–80.0)
Platelets: 132 10*3/uL — ABNORMAL LOW (ref 145–400)
RDW: 16.2 % — ABNORMAL HIGH (ref 11.1–15.7)

## 2011-06-05 LAB — IRON AND TIBC
%SAT: 30 % (ref 20–55)
Iron: 65 ug/dL (ref 42–145)
TIBC: 217 ug/dL — ABNORMAL LOW (ref 250–470)

## 2011-06-05 LAB — CHCC SATELLITE - SMEAR

## 2011-06-05 NOTE — Progress Notes (Signed)
CSN: [id number] This office note has been dictated. Josi Roediger R 06/05/2011

## 2011-06-06 ENCOUNTER — Telehealth: Payer: Self-pay | Admitting: Hematology & Oncology

## 2011-06-06 NOTE — Telephone Encounter (Signed)
Pt changed 1-3 to 1-8

## 2011-06-06 NOTE — Progress Notes (Signed)
CC:   Lenon Curt. Chilton Si, M.D. Thayer Headings, M.D. Madlyn Frankel Charlann Boxer, M.D. [facility] [place], [place] [phone]  DIAGNOSIS: 1. Anemia of renal insufficiency. 2. Iron-deficiency anemia.  CURRENT THERAPY:  Observation.  INTERIM HISTORY:  [name] comes in for followup.  She continues to look better and better.  Her left knee has gotten stronger and has healed from her knee surgery.  Unfortunately, her daughter was worried that there is some area of redness in the left lower leg and some swelling.  She noticed this, I think, a day or so ago.  Ms. Fontes does not complain of any pain in the left leg.  She has not noted any problems with fever.  Her appetite has been good.  There is no nausea and vomiting.  She is doing physical therapy at the assisted living.  She is improving with this.  Her overall energy and activity level have improved.  We did give her iron back in September.  She got a dose of Feraheme 510 mg.  Her last ferritin was 90 back on 04/23/2011.  Her iron saturation at that time was 17%.  PHYSICAL EXAMINATION:  General:  This is an elderly white female in no obvious distress.  Vital Signs:  Show a temperature of 97, pulse 69, respiratory rate 18, blood pressure 90/57.  Weight is 146.  Head and Neck Exam:  Shows a normocephalic, atraumatic skull.  There are no ocular or oral lesions.  There are no palpable cervical or supraclavicular lymph nodes.  Lungs:  Clear bilaterally.  Cardiac Exam: Regular rate and rhythm with a normal S1 and S2.  There are no murmurs, rubs or bruits.  Abdominal Exam:  Soft with good bowel sounds.  There is no palpable abdominal mass.  There is no palpable hepatosplenomegaly. Extremities:  Show some slight swelling of the left leg.  This is limited to the left lower leg.  She has some slight erythema just lateral to the tibia.  There is no tenderness to palpation.  No palpable venous cord is noted in the left leg.  She has a negative Homans  sign. She had good pulses in her distal extremities.  LABORATORY STUDIES:  White cell count 5.9, hemoglobin 12.2, hematocrit 36, platelet count is 132.  MCV is 87.  IMPRESSION:  Ms. Champa is very nice 77 year old white female with anemia of renal insufficiency.  She does have intermittent iron- deficiency anemia.  She did get iron back in September and responded very nicely.  I am going to put her on some doxycycline.  This hopefully will help with her left lower leg.  She has multiple allergies, so we really are limited to how we can treat her.  I will check her iron levels.  Her MCV is trending downward.  I suppose that she may need iron at some point.  Will go ahead and plan to get her back to see [place] in another couple months to see how she is doing.    ______________________________ Josph Macho, M.D. PRE/MEDQ  D:  06/05/2011  T:  06/05/2011  Job:  [id number]

## 2011-07-31 ENCOUNTER — Other Ambulatory Visit: Payer: PRIVATE HEALTH INSURANCE | Admitting: Lab

## 2011-07-31 ENCOUNTER — Ambulatory Visit: Payer: PRIVATE HEALTH INSURANCE | Admitting: Hematology & Oncology

## 2011-08-05 ENCOUNTER — Other Ambulatory Visit: Payer: Self-pay | Admitting: Hematology & Oncology

## 2011-08-05 ENCOUNTER — Ambulatory Visit (HOSPITAL_BASED_OUTPATIENT_CLINIC_OR_DEPARTMENT_OTHER): Payer: PRIVATE HEALTH INSURANCE | Admitting: Hematology & Oncology

## 2011-08-05 ENCOUNTER — Other Ambulatory Visit (HOSPITAL_BASED_OUTPATIENT_CLINIC_OR_DEPARTMENT_OTHER): Payer: Medicare Other | Admitting: Lab

## 2011-08-05 DIAGNOSIS — D509 Iron deficiency anemia, unspecified: Secondary | ICD-10-CM

## 2011-08-05 DIAGNOSIS — N289 Disorder of kidney and ureter, unspecified: Secondary | ICD-10-CM

## 2011-08-05 DIAGNOSIS — D649 Anemia, unspecified: Secondary | ICD-10-CM

## 2011-08-05 LAB — RETICULOCYTES (CHCC)
ABS Retic: 65.1 10*3/uL (ref 19.0–186.0)
ABS Retic: 65.1 10*3/uL (ref 19.0–186.0)
RBC.: 4.07 MIL/uL (ref 3.87–5.11)

## 2011-08-05 LAB — TECHNOLOGIST REVIEW CHCC SATELLITE

## 2011-08-05 LAB — CBC WITH DIFFERENTIAL (CANCER CENTER ONLY)
BASO#: 0 10*3/uL (ref 0.0–0.2)
Eosinophils Absolute: 0.4 10*3/uL (ref 0.0–0.5)
HCT: 36.1 % (ref 34.8–46.6)
HGB: 12.2 g/dL (ref 11.6–15.9)
LYMPH%: 42.9 % (ref 14.0–48.0)
MCH: 30.4 pg (ref 26.0–34.0)
MCV: 90 fL (ref 81–101)
MONO#: 0.9 10*3/uL (ref 0.1–0.9)
Platelets: 139 10*3/uL — ABNORMAL LOW (ref 145–400)
RBC: 4.01 10*6/uL (ref 3.70–5.32)
WBC: 6.1 10*3/uL (ref 3.9–10.0)

## 2011-08-05 LAB — IRON AND TIBC
%SAT: 29 % (ref 20–55)
TIBC: 208 ug/dL — ABNORMAL LOW (ref 250–470)
TIBC: 208 ug/dL — ABNORMAL LOW (ref 250–470)
UIBC: 147 ug/dL (ref 125–400)
UIBC: 147 ug/dL (ref 125–400)

## 2011-08-05 LAB — FERRITIN: Ferritin: 540 ng/mL — ABNORMAL HIGH (ref 10–291)

## 2011-08-05 NOTE — Progress Notes (Signed)
This office note has been dictated.

## 2011-10-20 ENCOUNTER — Ambulatory Visit (HOSPITAL_BASED_OUTPATIENT_CLINIC_OR_DEPARTMENT_OTHER): Payer: Medicare Other | Admitting: Hematology & Oncology

## 2011-10-20 ENCOUNTER — Other Ambulatory Visit (HOSPITAL_BASED_OUTPATIENT_CLINIC_OR_DEPARTMENT_OTHER): Payer: Medicare Other | Admitting: Lab

## 2011-10-20 VITALS — BP 128/70 | HR 66 | Temp 96.9°F

## 2011-10-20 DIAGNOSIS — D649 Anemia, unspecified: Secondary | ICD-10-CM

## 2011-10-20 DIAGNOSIS — D509 Iron deficiency anemia, unspecified: Secondary | ICD-10-CM

## 2011-10-20 DIAGNOSIS — N189 Chronic kidney disease, unspecified: Secondary | ICD-10-CM

## 2011-10-20 DIAGNOSIS — N289 Disorder of kidney and ureter, unspecified: Secondary | ICD-10-CM

## 2011-10-20 LAB — CBC WITH DIFFERENTIAL (CANCER CENTER ONLY)
BASO#: 0 10*3/uL (ref 0.0–0.2)
EOS%: 6.9 % (ref 0.0–7.0)
HGB: 12.2 g/dL (ref 11.6–15.9)
LYMPH%: 32 % (ref 14.0–48.0)
MCH: 30.7 pg (ref 26.0–34.0)
MCHC: 33.2 g/dL (ref 32.0–36.0)
MONO%: 24.4 % — ABNORMAL HIGH (ref 0.0–13.0)
NEUT#: 2.3 10*3/uL (ref 1.5–6.5)
NEUT%: 36.5 % — ABNORMAL LOW (ref 39.6–80.0)

## 2011-10-20 LAB — CHCC SATELLITE - SMEAR

## 2011-10-20 LAB — IRON AND TIBC
%SAT: 27 % (ref 20–55)
Iron: 59 ug/dL (ref 42–145)
UIBC: 160 ug/dL (ref 125–400)

## 2011-10-20 NOTE — Progress Notes (Signed)
This office note has been dictated.

## 2011-10-21 ENCOUNTER — Telehealth: Payer: Self-pay | Admitting: *Deleted

## 2011-10-21 NOTE — Progress Notes (Signed)
CC:   Lenon Curt. Chilton Si, M.D. Thayer Headings, M.D. Madlyn Frankel Charlann Boxer, M.D. [facility] [place], [place] [phone]  DIAGNOSES: 1. Anemia of renal insufficiency. 2. Intermittent iron deficiency anemia.  CURRENT THERAPY:  Observation.  INTERIM HISTORY:  [name] comes in for her followup.  She continues to look real good.  Her left leg is doing pretty nicely.  There is less swelling in the leg.  She is on antibiotics on a chronic basis.  This helped with the cellulitis.  She is getting around better.  She has a good appetite.  She has had no problems with nausea and vomiting.  When we last saw her in January, her ferritin was 540.  Her iron saturation was 29%.  I think she last got iron back in September.  She has had no problems with bleeding or bruising.  There has been no change in bowel or bladder habits.  PHYSICAL EXAM:  General: This is a well-developed, well-nourished, elderly white female in no obvious distress.  Vital signs: Show a temperature of 96 9, pulse 65, respiratory 18, blood pressure 120/70, and weight was not taken.  Head and neck exam shows a normocephalic, atraumatic skull.  There are no ocular or oral lesions.  There are no palpable cervical or supraclavicular lymph nodes.  Lungs are clear bilaterally.  Cardiac examination:  Regular rhythm with a normal S1 and S2.  There are no murmurs, rubs, or bruits.  Abdominal exam: Soft with good bowel sounds.  There is no palpable abdominal mass.  There is no palpable hepatosplenomegaly.  Extremities: Shows the left knee with the healed surgical scar.  There is minimal edema in the left leg.  There is decent range of motion of the lower extremities.  Skin exam: Shows no rashes, ecchymoses, or petechia.  LABORATORY STUDIES:  White cell count is 6.4, hemoglobin 12.2, hematocrit 36.7, and platelet count 150.  MCV is 92.  IMPRESSION:  [name] is a 78 year old white female with anemia of renal insufficiency.  She also  has some degree of iron deficiency anemia.  She has really done well.  Her blood counts are holding nice and steady.  I do not see a need for any intervention right now.  I think we can probably get her back in 3 months' time for followup.    ______________________________ Josph Macho, M.D. PRE/MEDQ  D:  10/20/2011  T:  10/20/2011  Job:  [id number]

## 2011-10-21 NOTE — Telephone Encounter (Signed)
Message copied by Anselm Jungling on Tue Oct 21, 2011 12:42 PM ------      Message from: Arlan Organ R      Created: Mon Oct 20, 2011  6:54 PM       Called her daughter and tell her that her mother's iron studies are still good and that she does not need any IV iron.  Cindee Lame

## 2011-10-21 NOTE — Telephone Encounter (Signed)
Called patients mother to let her know that her moms iron studies still look good per dr. Myna Hidalgo. No IV iron needed

## 2011-10-22 ENCOUNTER — Ambulatory Visit: Payer: Medicare Other | Admitting: Hematology & Oncology

## 2011-10-22 ENCOUNTER — Other Ambulatory Visit: Payer: Medicare Other | Admitting: Lab

## 2011-11-14 NOTE — Progress Notes (Signed)
CC:   [name]. [name], M.D. Thayer Headings, M.D. [name] [name], M.D.  DIAGNOSES: 1. Anemia of renal insufficiency. 2. Iron deficiency anemia.  INTERIM HISTORY:  [name] comes in for followup.  She is doing okay.  She is still getting rehab.  She is getting this for her left knee.  She is on chronic antibiotics for her left knee.  I am not sure as to why this is being done.  [name]'s daughter is really worried about her mom being on antibiotics all the time.  Personally, I do not see a problem with her getting off the antibiotics.  She has not had any problems bleeding.  Appetite is doing well.  She has had no nausea and vomiting.  She has had no fever, sweats or chills.  When we last saw her, her ferritin was 562.  Iron saturation was 30%. Her serum iron was 65.  A lot of the ferritin was acute phase reactant.  PHYSICAL EXAM:  This is an elderly white female in no obvious distress. Vital signs:  Show a temperature of 97, pulse 61, respiratory rate 18, blood pressure 106/65.  Weight was not taken.  Head and neck:  Shows a normocephalic, atraumatic skull.  There are no ocular or oral lesions. There are no palpable cervical or supraclavicular lymph nodes.  Lungs: Clear to percussion and auscultation bilaterally.  Cardiac:  Regular rate and rhythm with normal S1 and S2.  There are no murmurs, rubs or bruits.  Abdomen:  Soft with good bowel sounds.  There is no palpable abdominal mass.  There is no palpable hepatosplenomegaly.  Back:  No tenderness of the spine, ribs, or hips.  Extremities:  Shows no clubbing, cyanosis, or edema.  She has a knee brace on the left knee. Maybe some slight nonpitting edema of the left lower leg.  I do not see any erythema on the skin.  Neurologic:  Exam shows no focal neurological deficits.  LABORATORY STUDIES:  White cell count is 26.1, hemoglobin 12.2, hematocrit 36.1, platelet count 139.  White cell differential shows 36 segs, 43  lymphs and 14 monocytes.  IMPRESSION:  [name] is a 78 year old white female with iron deficiency anemia.  She also has some mild renal insufficiency.  Her 89th birthday is going to be this weekend.  I am so glad that she will be able to celebrate with her family.  I looked at her blood under the microscope.  I did not see anything that was unusual.  I did not see any immature cells that suggested leukemia.  We will go ahead and plan to get her back in 3 months.  I think we can get her through wintertime as her platelet count appears to be so stable.    ______________________________ Josph Macho, M.D. PRE/MEDQ  D:  08/05/2011  T:  08/05/2011  Job:  [id number]

## 2012-01-09 ENCOUNTER — Other Ambulatory Visit (HOSPITAL_BASED_OUTPATIENT_CLINIC_OR_DEPARTMENT_OTHER): Payer: Medicare Other | Admitting: Lab

## 2012-01-09 ENCOUNTER — Ambulatory Visit (HOSPITAL_BASED_OUTPATIENT_CLINIC_OR_DEPARTMENT_OTHER): Payer: Medicare Other | Admitting: Hematology & Oncology

## 2012-01-09 VITALS — BP 102/66 | HR 72 | Temp 97.2°F | Ht 66.0 in | Wt 172.0 lb

## 2012-01-09 DIAGNOSIS — D649 Anemia, unspecified: Secondary | ICD-10-CM

## 2012-01-09 DIAGNOSIS — N189 Chronic kidney disease, unspecified: Secondary | ICD-10-CM

## 2012-01-09 DIAGNOSIS — D509 Iron deficiency anemia, unspecified: Secondary | ICD-10-CM

## 2012-01-09 LAB — IRON AND TIBC
TIBC: 205 ug/dL — ABNORMAL LOW (ref 250–470)
UIBC: 148 ug/dL (ref 125–400)

## 2012-01-09 LAB — CBC WITH DIFFERENTIAL (CANCER CENTER ONLY)
HGB: 12.7 g/dL (ref 11.6–15.9)
MCV: 91 fL (ref 81–101)
Platelets: 113 10*3/uL — ABNORMAL LOW (ref 145–400)
RBC: 4.2 10*6/uL (ref 3.70–5.32)
WBC: 10.6 10*3/uL — ABNORMAL HIGH (ref 3.9–10.0)

## 2012-01-09 LAB — MANUAL DIFFERENTIAL (CHCC SATELLITE)
Band Neutrophils: 2 % (ref 0–10)
Eos: 2 % (ref 0–7)
MONO: 12 % (ref 0–13)
SEG: 34 % — ABNORMAL LOW (ref 40–75)

## 2012-01-09 LAB — FERRITIN: Ferritin: 502 ng/mL — ABNORMAL HIGH (ref 10–291)

## 2012-01-09 NOTE — Progress Notes (Signed)
This office note has been dictated.

## 2012-01-12 NOTE — Progress Notes (Signed)
CC:   [facility], [phone] Tricia Curt. [name], M.D. [name] [name], M.D. [name], M.D.  DIAGNOSES: 1. Anemia of renal insufficiency. 2. Intermittent iron-deficiency anemia.  CURRENT THERAPY: 1. IV iron as indicated.  INTERIM HISTORY:  [name] comes in for followup.  This is the best I have seen her look in a long time.  She is doing very well at [facility].  She is doing okay with physical therapy.  Her left leg seems to be getting better.  She is eating well.  There is no problem with bowels or bladder.  When we last saw her, her ferritin was 317 with an iron saturation of 27%.  PHYSICAL EXAMINATION:  This is an elderly white female in no obvious distress.  Vital signs:  97, pulse 72, respiratory rate 18, blood pressure 102/66.  Weight is 172.  Head and neck:  A normocephalic, atraumatic skull.  There are no ocular or oral lesions.  There are no palpable cervical or supraclavicular lymph nodes.  Lungs:  Clear bilaterally.  Cardiac:  Regular rate and rhythm with a normal S1 and S2. There are no murmurs, rubs or bruits.  Abdomen:  Soft with good bowel sounds.  There is no fluid wave.  There is no palpable hepatosplenomegaly.  Back:  Some slight kyphosis.  No tenderness is noted over the spine, ribs, or hips.  Extremities:  No clubbing, cyanosis or edema.  There is still some slight swelling in the left leg.  LABORATORY STUDIES:  White cell count is 10.6, hemoglobin 12.7, hematocrit 38.1, platelet count 113.  The ferritin 502 with a saturation of 28%.  IMPRESSION:  [name] is a 78 year old white female with a history of iron-deficiency anemia and anemia of renal insufficiency.  She is doing real well.  Her iron levels are okay.  I do not see that we have got to anything with her for right now.  I think we get her through the summer without having her to come back.  I will plan see back I think in September after Labor  Day.    ______________________________ Josph Macho, M.D. PRE/MEDQ  D:  01/09/2012  T:  01/09/2012  Job:  [id number]

## 2012-01-14 ENCOUNTER — Telehealth: Payer: Self-pay | Admitting: Hematology & Oncology

## 2012-01-14 NOTE — Telephone Encounter (Addendum)
Message copied by Cathi Roan on Wed Jan 14, 2012  1:07 PM ------      Message from: Josph Macho      Created: Sun Jan 11, 2012  8:45 PM       Call dgtr - mom's labs are ok.  No need for iron.  Cindee Lame  01-14-12 @1 :08 pm, Called and spoke to patients daughter regarding above MD message. ST

## 2012-01-21 ENCOUNTER — Other Ambulatory Visit: Payer: Medicare Other | Admitting: Lab

## 2012-01-21 ENCOUNTER — Ambulatory Visit: Payer: Medicare Other | Admitting: Hematology & Oncology

## 2012-04-15 ENCOUNTER — Other Ambulatory Visit (HOSPITAL_BASED_OUTPATIENT_CLINIC_OR_DEPARTMENT_OTHER): Payer: Medicare Other | Admitting: Lab

## 2012-04-15 ENCOUNTER — Ambulatory Visit (HOSPITAL_BASED_OUTPATIENT_CLINIC_OR_DEPARTMENT_OTHER): Payer: Medicare Other | Admitting: Medical

## 2012-04-15 VITALS — BP 119/62 | HR 57 | Temp 97.5°F | Resp 18 | Ht 66.0 in | Wt 175.0 lb

## 2012-04-15 DIAGNOSIS — D649 Anemia, unspecified: Secondary | ICD-10-CM

## 2012-04-15 DIAGNOSIS — N289 Disorder of kidney and ureter, unspecified: Secondary | ICD-10-CM

## 2012-04-15 DIAGNOSIS — D631 Anemia in chronic kidney disease: Secondary | ICD-10-CM

## 2012-04-15 DIAGNOSIS — D509 Iron deficiency anemia, unspecified: Secondary | ICD-10-CM

## 2012-04-15 LAB — CBC WITH DIFFERENTIAL (CANCER CENTER ONLY)
HCT: 37.1 % (ref 34.8–46.6)
MCH: 30.9 pg (ref 26.0–34.0)
MCHC: 34 g/dL (ref 32.0–36.0)
MCV: 91 fL (ref 81–101)
RDW: 15 % (ref 11.1–15.7)

## 2012-04-15 LAB — MANUAL DIFFERENTIAL (CHCC SATELLITE)
Eos: 1 % (ref 0–7)
MONO: 11 % (ref 0–13)
PLT EST ~~LOC~~: DECREASED

## 2012-04-15 NOTE — Progress Notes (Signed)
Diagnoses: #1 anemia of renal insufficiency. #2 intermittent iron deficiency anemia.  Current therapy: #1 IV iron as indicated.  Interim history: [name] presents today for an office followup visit.  Her daughter accompanies her.  She still resides in assisted at [facility] and is doing fairly well.  She still continues to do exercises, with the staff, air.  She does not report any new problems or new, complications.  She is eating, well.  She denies any problems with her bowels or bladder.  She denies any nausea, vomiting, diarrhea, constipation, chest pain, shortness of breath.  She denies any cough, fevers, or chills.  She denies any obvious, or abnormal leading.  She denies any headaches, visual changes, or any rashes.  When we last saw her back in June, her ferritin was 502,000, with an iron of 57, and percent saturation of 28%.  Her hemoglobin looks great.  Today at 12.6, hematocrit 37.1, MCV is 90.9.  Review of Systems: Pt. Denies any changes in their vision, hearing, adenopathy, fevers, chills, nausea, vomiting, diarrhea, constipation, chest pain, shortness of breath, passing blood, passing out, blacking out,  any changes in skin, joints, neurologic or psychiatric except as noted.  Physical Exam: This is a very pleasant, elderly, 78 year old, white female, in no obvious distress Vitals: Temperature 97.5 degrees, pulse 57, respirations 18, blood pressure 119/62.  Weight 175 pounds HEENT reveals a normocephalic, atraumatic skull, no scleral icterus, no oral lesions  Neck is supple without any cervical or supraclavicular adenopathy.  Lungs are clear to auscultation bilaterally. There are no wheezes, rales or rhonci Cardiac is regular rate and rhythm with a normal S1 and S2. There are no murmurs, rubs, or bruits.  Abdomen is soft with good bowel sounds, there is no palpable mass. There is no palpable hepatosplenomegaly. There is no palpable fluid wave.  Musculoskeletal no  tenderness of the spine, ribs, or hips.  Extremities there are no clubbing, cyanosis, or edema.  Skin no petechia, purpura or ecchymosis Neurologic is nonfocal.  Laboratory Data: White count 11.7, hemoglobin 12.6, hematocrit 37.1, MCV 90.9, platelets 120,000  Current Outpatient Prescriptions on File Prior to Visit  Medication Sig Dispense Refill  . acetaminophen (TYLENOL) 325 MG tablet Take 325 mg by mouth every 6 (six) hours as needed.        . beta carotene w/minerals (OCUVITE) tablet Take 1 tablet by mouth daily.        . calcium-vitamin D (OSCAL WITH D) 500-200 MG-UNIT per tablet Take 1 tablet by mouth 2 (two) times daily.        Marland Kitchen docusate sodium (COLACE) 100 MG capsule Take 100 mg by mouth 2 (two) times daily.        . DULoxetine (CYMBALTA) 30 MG capsule Take 30 mg by mouth daily.        . ferrous sulfate 325 (65 FE) MG tablet Take 325 mg by mouth 2 (two) times daily with a meal.       . folic acid (FOLVITE) 1 MG tablet Take 1 mg by mouth daily.        . furosemide (LASIX) 20 MG tablet Take 20 mg by mouth daily.        Marland Kitchen levothyroxine (SYNTHROID, LEVOTHROID) 25 MCG tablet Take 25 mcg by mouth daily.      . magnesium oxide (MAG-OX) 400 MG tablet Take 400 mg by mouth daily.        . metoprolol succinate (TOPROL-XL) 25 MG 24 hr tablet Take 25  mg by mouth daily. Taking 1/2 tab       . Multiple Vitamin (MULTIVITAMIN) tablet Take 1 tablet by mouth daily.        . Nutritional Supplements (RESOURCE ARGINAID PO) Take by mouth as needed.        . potassium chloride (MICRO-K) 10 MEQ CR capsule Take 10 mEq by mouth daily. Takes 20 meq (2) 10 meq daily       . sennosides-docusate sodium (SENOKOT-S) 8.6-50 MG tablet Take by mouth daily.        Marland Kitchen warfarin (COUMADIN) 4 MG tablet Take 6 mg by mouth daily. Taking 6 mg alt with 5mg        Assessment/Plan: This is a pleasant, elderly, 78 year old, white female, with the following issues:  #1 History of iron deficiency anemia-we will await.  The results  of her iron levels, and they give her a call to let her no having R.  I do not see that we need to do anything for her right now.  We will continue to monitor her counts.  #2 anemia of renal insufficiency-her blood work looks good today.  We will continue to monitor her counts.  #3 followup-we will follow back up with her in 3 months, but before then should there be questions or concerns.

## 2012-04-20 ENCOUNTER — Telehealth: Payer: Self-pay | Admitting: Hematology & Oncology

## 2012-04-20 NOTE — Telephone Encounter (Addendum)
Message copied by Cathi Roan on Tue Apr 20, 2012  3:11 PM ------      Message from: [place], [place]      Created: Fri Apr 16, 2012 10:19 AM                   ----- Message -----         From: Josph Macho, MD         Sent: 04/16/2012   7:26 AM           To: Onc Nurse Hp            Call her daughter and tell her that her mom's iron levels are excellent. Thanks. Cindee Lame   04-20-12  15:11, Called and spoke to pt's daughter, Alexia Freestone regarding above MD message. Daughter verbalized she understood.  Lupita Raider LPN

## 2012-05-04 ENCOUNTER — Telehealth: Payer: Self-pay | Admitting: Hematology & Oncology

## 2012-05-04 NOTE — Telephone Encounter (Signed)
Pt moved 12-19 to 08-05-12

## 2012-07-15 ENCOUNTER — Ambulatory Visit: Payer: Medicare Other | Admitting: Hematology & Oncology

## 2012-07-15 ENCOUNTER — Other Ambulatory Visit: Payer: Medicare Other | Admitting: Lab

## 2012-07-27 ENCOUNTER — Telehealth: Payer: Self-pay | Admitting: Hematology & Oncology

## 2012-07-27 NOTE — Telephone Encounter (Signed)
Pt moved 1-9 to 08-25-12

## 2012-08-05 ENCOUNTER — Other Ambulatory Visit: Payer: Medicare Other | Admitting: Lab

## 2012-08-05 ENCOUNTER — Ambulatory Visit: Payer: Medicare Other | Admitting: Hematology & Oncology

## 2012-08-23 ENCOUNTER — Telehealth: Payer: Self-pay | Admitting: Hematology & Oncology

## 2012-08-23 NOTE — Telephone Encounter (Signed)
Patty called canceled 1-29 appointment due to weather said would call back to reschedule

## 2012-08-25 ENCOUNTER — Other Ambulatory Visit: Payer: Medicare Other | Admitting: Lab

## 2012-08-25 ENCOUNTER — Ambulatory Visit: Payer: Medicare Other | Admitting: Hematology & Oncology

## 2012-09-23 IMAGING — CR DG CHEST 1V PORT
1 series · 1 of 1 positions shown · non-contrast
Comparison: 03/30/2007

CLINICAL DATA: Short of breath

PORTABLE CHEST - 1 VIEW

[view not recorded]
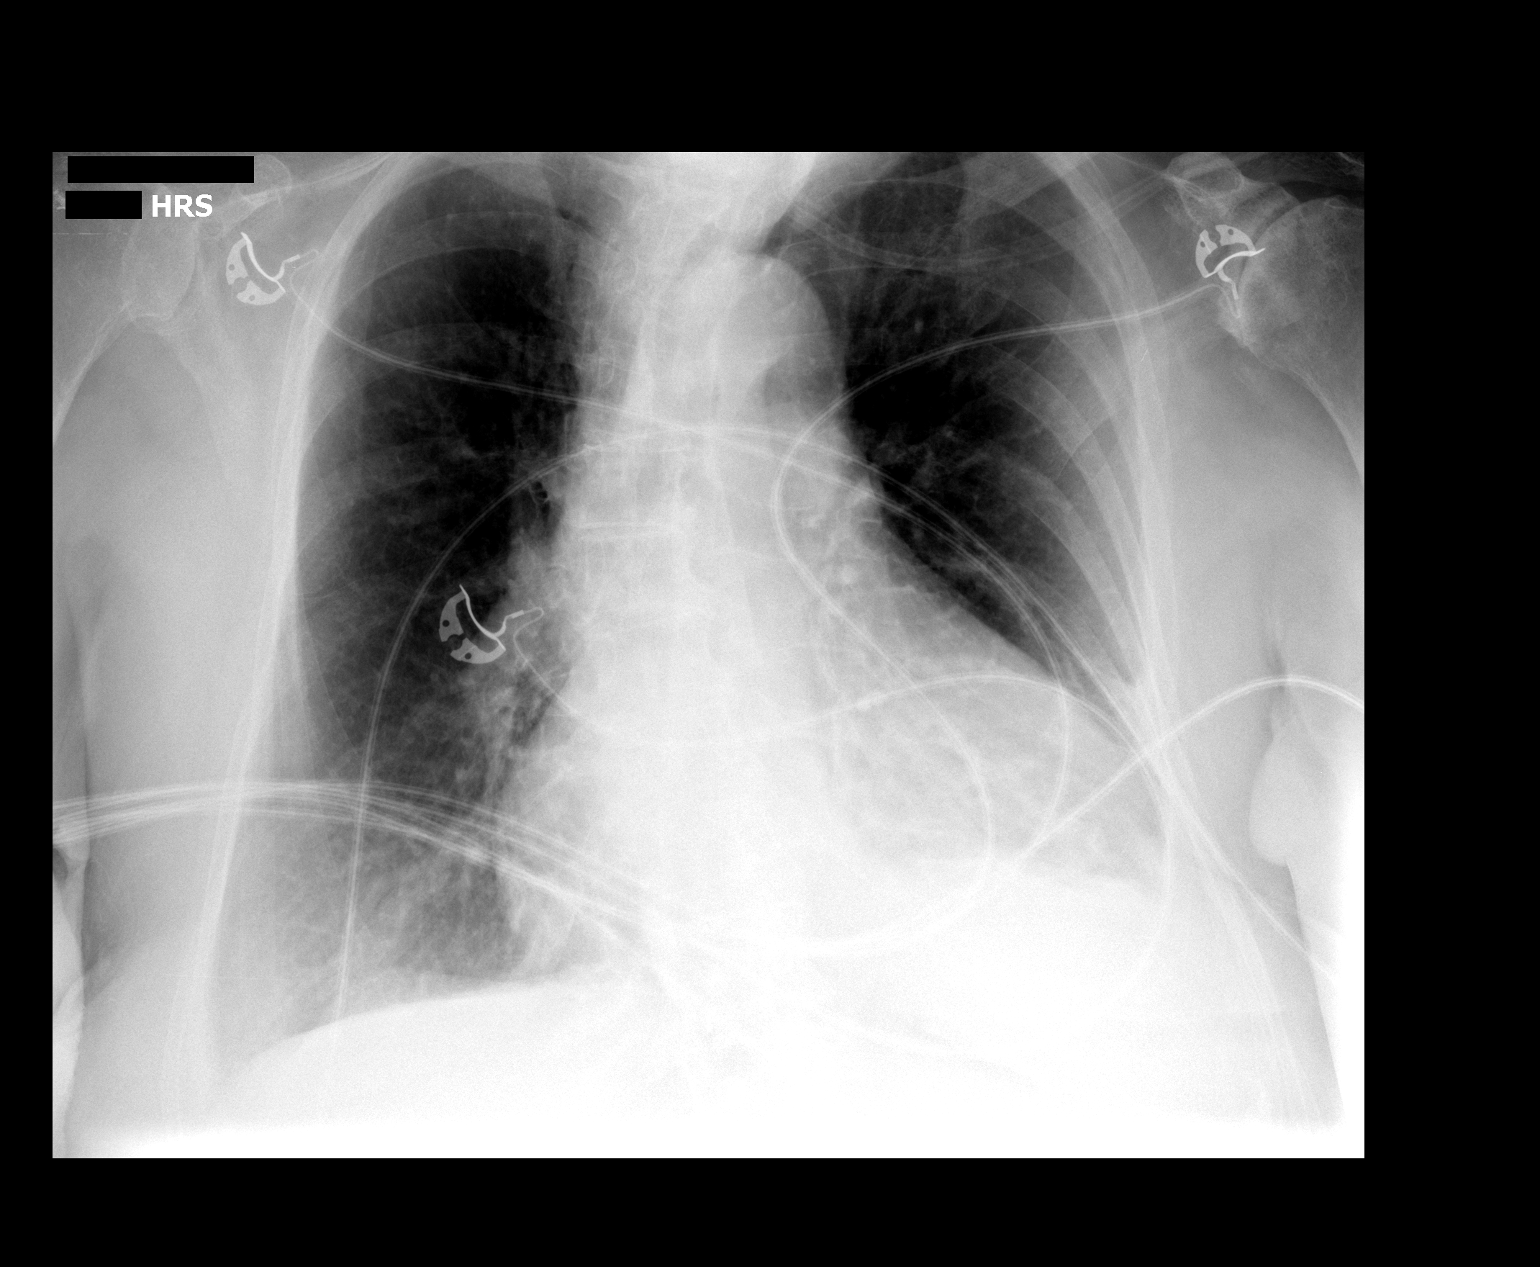

[1 of 1 positions shown; findings below may reference images not displayed]

FINDINGS: Heart mildly enlarged.  No frank congestive heart failure
or active disease in one-view. The lungs are hyperaerated
consistent with COPD.  No significant pleural fluid.
IMPRESSION: Cardiomegaly - no frank congestive heart failure or active disease
in one-view. There are changes of COPD.

## 2012-10-22 ENCOUNTER — Non-Acute Institutional Stay (SKILLED_NURSING_FACILITY): Payer: Medicare Other | Admitting: Nurse Practitioner

## 2012-10-22 DIAGNOSIS — I2699 Other pulmonary embolism without acute cor pulmonale: Secondary | ICD-10-CM

## 2012-10-22 DIAGNOSIS — D509 Iron deficiency anemia, unspecified: Secondary | ICD-10-CM

## 2012-10-22 DIAGNOSIS — E039 Hypothyroidism, unspecified: Secondary | ICD-10-CM | POA: Insufficient documentation

## 2012-10-22 DIAGNOSIS — I10 Essential (primary) hypertension: Secondary | ICD-10-CM | POA: Insufficient documentation

## 2012-10-22 DIAGNOSIS — R0602 Shortness of breath: Secondary | ICD-10-CM

## 2012-10-22 DIAGNOSIS — I428 Other cardiomyopathies: Secondary | ICD-10-CM

## 2012-10-22 DIAGNOSIS — M25519 Pain in unspecified shoulder: Secondary | ICD-10-CM

## 2012-10-22 DIAGNOSIS — M25512 Pain in left shoulder: Secondary | ICD-10-CM

## 2012-10-22 DIAGNOSIS — F332 Major depressive disorder, recurrent severe without psychotic features: Secondary | ICD-10-CM | POA: Insufficient documentation

## 2012-10-22 DIAGNOSIS — I4891 Unspecified atrial fibrillation: Secondary | ICD-10-CM

## 2012-10-22 NOTE — Progress Notes (Signed)
Subjective:    Patient ID: [name], female    DOB: 1914-12-08, 79 y.o.   MRN: [id number]  HPI  C/o left shoulder pain, not new, prn Norco not utilized since the patient dosen't ask for .   Acute bronchitis: resolved Rocephin   On Doxy for chronic infected left knee.    The patient's left pedal edema-stable, takes Furosemide 20mg  daily, BNP 50.5 08/11/12   244.9-HYPOTHYROIDISM  Levothyroxine 12/16/11--TSH 4.102 07/22/12   280.9-ANEMIA, IRON DEF.  Hgb 12.0 06/14/12,    off Iron 05/18/12 and continue Folate.    296.33-MAJOR DEPRESSIVE DISORDER, RECURRENT EPISODE, SEVE  stabilized on Cymbalta 30mg  since 04/18/11   401.9-HTN UNSPECIFIED  controlled. Metoprolol, 136/64   415.19-PULMONARY EMBOLISM  hx of it, occurred 11/1997 on Coumadin.    425.4-CARDIOMYOPATHY  hx of it.  Has seen Dr. Alanda Amass twice.EF 35%   427.31-AFIB  rate controlled on Metoprolol,   Follow  up with Dr. Alanda Amass.   428.0-CONGESTIVE HEART FAILURE  compensated. EF 35%, systolic heart failure. On Lasix 20mg    719.41-PAIN SHOULDER   chronic left shoulder pain since 2008. Has been evaluated Dr.Gioffre. he has advised against a shoulder replacement. Vicodin helped    786.05-SHORTNESS OF BREATH  DOE stable.   Review of Systems  Constitutional: Negative for fever, diaphoresis, appetite change, fatigue and unexpected weight change.  HENT: Negative for hearing loss, congestion, neck pain, neck stiffness and sinus pressure.   Eyes: Negative.   Respiratory: Positive for shortness of breath. Negative for cough and wheezing.   Cardiovascular: Positive for leg swelling. Negative for chest pain and palpitations.  Gastrointestinal: Negative for abdominal pain and constipation.  Endocrine: Negative.   Genitourinary: Positive for frequency. Negative for dysuria, urgency, flank pain and pelvic pain.  Musculoskeletal: Positive for back pain and gait problem (w/c for mobility, not a ambulatory). Arthralgias: left shoulder and left  knee more prominenet.  Skin: Negative for rash and wound.  Allergic/Immunologic: Negative.   Neurological: Negative for dizziness, tremors, syncope, facial asymmetry, speech difficulty, weakness, light-headedness, numbness and headaches.  Hematological: Negative.   Psychiatric/Behavioral: Negative.        Objective:   Physical Exam  Constitutional: She is oriented to person, place, and time. She appears well-developed and well-nourished. No distress.  HENT:  Head: Normocephalic and atraumatic.  Eyes: Conjunctivae and EOM are normal. Pupils are equal, round, and reactive to light.  Neck: Normal range of motion. Neck supple. No JVD present. No thyromegaly present.  Cardiovascular: Normal rate and normal heart sounds.  Frequent extrasystoles are present.  Pulses:      Dorsalis pedis pulses are 2+ on the right side, and 1+ on the left side.  Pulmonary/Chest: She has no wheezes. She has no rales. She exhibits no tenderness.  Abdominal: Soft. Bowel sounds are normal.  Musculoskeletal: She exhibits edema (left knee).       Left shoulder: She exhibits decreased range of motion, tenderness, crepitus and pain.  Lymphadenopathy:    She has no cervical adenopathy.  Neurological: She is alert and oriented to person, place, and time. She has normal reflexes. She displays normal reflexes. No cranial nerve deficit. She exhibits normal muscle tone. Coordination normal.  Skin: Skin is dry. No rash noted. She is not diaphoretic. There is erythema (left knee).  Psychiatric: She has a normal mood and affect. Her behavior is normal. Judgment and thought content normal. Cognition and memory are impaired. She exhibits abnormal recent memory.  Assessment & Plan:   Unspecified hypothyroidism Update TSH  . Iron deficiency anemia, unspecified Update CBC, continue folic acid.   . Major depressive disorder, recurrent episode, severe, without mention of psychotic behavior Stable with Symbalta.    Marland Kitchen Unspecified essential hypertension Controlled, continue Metoprolol  . Other pulmonary embolism and infarction Anticoagulation with Coumadin  . Atrial fibrillation Rate controlled, continue Coumadin  . Pain in joint, shoulder region 1/2 Norco 5/325mg  nightly  . Shortness of breath On exertion related to CHF--update CMP, continue Furosemide

## 2012-10-29 ENCOUNTER — Non-Acute Institutional Stay (SKILLED_NURSING_FACILITY): Payer: Medicare Other | Admitting: Nurse Practitioner

## 2012-10-29 DIAGNOSIS — M25519 Pain in unspecified shoulder: Secondary | ICD-10-CM

## 2012-10-29 DIAGNOSIS — I4891 Unspecified atrial fibrillation: Secondary | ICD-10-CM

## 2012-10-29 DIAGNOSIS — F332 Major depressive disorder, recurrent severe without psychotic features: Secondary | ICD-10-CM

## 2012-10-29 DIAGNOSIS — E039 Hypothyroidism, unspecified: Secondary | ICD-10-CM

## 2012-10-29 DIAGNOSIS — M1711 Unilateral primary osteoarthritis, right knee: Secondary | ICD-10-CM

## 2012-10-29 DIAGNOSIS — I509 Heart failure, unspecified: Secondary | ICD-10-CM

## 2012-10-29 DIAGNOSIS — D509 Iron deficiency anemia, unspecified: Secondary | ICD-10-CM

## 2012-10-29 DIAGNOSIS — I2699 Other pulmonary embolism without acute cor pulmonale: Secondary | ICD-10-CM

## 2012-10-29 DIAGNOSIS — I428 Other cardiomyopathies: Secondary | ICD-10-CM

## 2012-10-29 DIAGNOSIS — M1710 Unilateral primary osteoarthritis, unspecified knee: Secondary | ICD-10-CM

## 2012-10-29 DIAGNOSIS — M25512 Pain in left shoulder: Secondary | ICD-10-CM

## 2012-10-29 DIAGNOSIS — I10 Essential (primary) hypertension: Secondary | ICD-10-CM

## 2012-10-29 NOTE — Progress Notes (Signed)
Subjective:    Patient ID: [name], female    DOB: 11/20/1914, 79 y.o.   MRN: [id number]  HPI   C/o a lump on the the top of the distal femur--s/p total knee replacement, no pain, small lump palpated         Edema: trace, the dtr stated its worse, no change in my eye. The patient's left pedal edema-stable, takes Furosemide 20mg  daily, BNP 50.5 08/11/12   On Doxy for chronic infected left knee.    HYPOTHYROIDISM  Levothyroxine 12/16/11--TSH 4.405 10/25/12   ANEMIA, IRON DEF.  Hgb 12.7 10/25/12,  off Iron 05/18/12 and continue Folate.    MAJOR DEPRESSIVE DISORDER, RECURRENT EPISODE, SEVE  stabilized on Cymbalta 30mg  since 04/18/11   HTN UNSPECIFIED  controlled. Metoprolol   PULMONARY EMBOLISM  hx of it, occurred 11/1997 on Coumadin.    CARDIOMYOPATHY  hx of it.  Has seen Dr. Alanda Amass twice.EF 35%   AFIB  rate controlled on Metoprolol,   Follow  up with Dr. Alanda Amass.   CONGESTIVE HEART FAILURE  compensated. EF 35%, systolic heart failure. On Lasix 20mg    PAIN SHOULDER   chronic left shoulder pain since 2008. Has been evaluated Dr.Gioffre. he has advised against a shoulder replacement. Vicodin helped    SHORTNESS OF BREATH  DOE stable.   Review of Systems  Constitutional: Negative for fever, diaphoresis, appetite change, fatigue and unexpected weight change.  HENT: Negative for hearing loss, congestion, neck pain, neck stiffness and sinus pressure.   Eyes: Negative.   Respiratory: Positive for shortness of breath. Negative for cough and wheezing.   Cardiovascular: Positive for leg swelling. Negative for chest pain and palpitations.  Gastrointestinal: Negative for abdominal pain and constipation.  Endocrine: Negative.   Genitourinary: Positive for frequency. Negative for dysuria, urgency, flank pain and pelvic pain.  Musculoskeletal: Positive for back pain and gait problem (w/c for mobility, not a ambulatory). Arthralgias: left shoulder and left knee more prominenet.  Skin:  Negative for rash and wound.  Allergic/Immunologic: Negative.   Neurological: Negative for dizziness, tremors, syncope, facial asymmetry, speech difficulty, weakness, light-headedness, numbness and headaches.  Hematological: Negative.   Psychiatric/Behavioral: Negative.        Objective:   Physical Exam  Constitutional: She is oriented to person, place, and time. She appears well-developed and well-nourished. No distress.  HENT:  Head: Normocephalic and atraumatic.  Eyes: Conjunctivae and EOM are normal. Pupils are equal, round, and reactive to light.  Neck: Normal range of motion. Neck supple. No JVD present. No thyromegaly present.  Cardiovascular: Normal rate and normal heart sounds.  Frequent extrasystoles are present.  Pulses:      Dorsalis pedis pulses are 2+ on the right side, and 1+ on the left side.  Pulmonary/Chest: She has no wheezes. She has no rales. She exhibits no tenderness.  Abdominal: Soft. Bowel sounds are normal.  Musculoskeletal: She exhibits edema (left knee).       Left shoulder: She exhibits decreased range of motion, tenderness, crepitus and pain.       Legs: Anterior distal femur 2x2cm firm nodule palpated, fixed, hard, non tender  Lymphadenopathy:    She has no cervical adenopathy.  Neurological: She is alert and oriented to person, place, and time. She has normal reflexes. No cranial nerve deficit. She exhibits normal muscle tone. Coordination normal.  Skin: Skin is dry. No rash noted. She is not diaphoretic. There is erythema (left knee).  Psychiatric: She has a normal mood and affect. Her  behavior is normal. Judgment and thought content normal. Cognition and memory are impaired. She exhibits abnormal recent memory.     LABS REVIEWED:   01/09/12 Dr. Gustavo Lah   CBC wbc 10.6, Hgb 12.7, Hct 38.1, plt 113  03/08/12   TSH 4.194     PT/INR  29.6/2.71 03/30/12  PT/INR 29.8/2.73 04/08/12 CBC wbc 11.1, Hgb 11.8, Hct 33.8, plt 123   BMP Na 138,K 4.2, glucose 92,  Bun 22, creatinine 0.97, Ca 8.9 04/26/12 PT/INR 28.6/2.84 05/14/12 CBC wbc 11.3, Hgb 11.5, Hct 33.7, plt 120, ferritin 48 05/27/12  PT/INR 32.2/3.33 06/03/12  PT/INR 34/3.59 06/08/12  UA E Coli, Proteus Mirabilis treated with Septra DS 80,000c/ml 06/10/12  PT/INR 29.3/2.93 06/13/12  PT/INR 32.2/3.33 06/14/12  Fe 64, YIBC 201, Fe Sat 32   CBC wbc 13.7, Hgb 12.0, Hct 34.8, plt 138 06/15/12  PT/INR 27.8/2.73 06/21/12  PT/INR 23.2/2.14 07/05/12  PT/INR 23.0/2.12 07/22/12  CMP Na 138, K 4.3, glucose 111, Bun 22, creatinine 1.11, Ca 8.8, LFT wnl except total protein 5.2   TSH 4.102 08/11/12  BNP 50.5 08/26/12  PT/INR 25.0/2.37 10/25/12 CBC wbc 12.7, Hgb 11.0, Hct 32.2, plt 116   PT/INR 20.1/1.76   CMP Na 140,K 4.3, glucose 97, Bun 23, creatinine 1.14, Ca3.0, LFT wnl except total protein 4.8, albumin 3.0   TSH 4.405     Assessment & Plan:   Unspecified hypothyroidism Corrected.   . Iron deficiency anemia, unspecified Hgb 11.0 10/25/12 continue folic acid  . Major depressive disorder, recurrent episode, severe, without mention of psychotic behavior Stable with Symbalta.   Marland Kitchen Unspecified essential hypertension Controlled, continue Metoprolol  . Other pulmonary embolism and infarction Anticoagulation with Coumadin  . Atrial fibrillation Rate controlled, continue Coumadin  . Pain in joint, shoulder region 1/2 Norco 5/325mg  nightly  . Shortness of breath On exertion related to CHF--continue Furosemide  Edema: weight daily to observe  Right knee: OA/lump--deformity from previous total knee replacement, X-ray 3 views to evaluate

## 2012-11-05 ENCOUNTER — Other Ambulatory Visit (HOSPITAL_COMMUNITY): Payer: Self-pay | Admitting: Cardiovascular Disease

## 2012-11-05 ENCOUNTER — Non-Acute Institutional Stay (SKILLED_NURSING_FACILITY): Payer: Medicare Other | Admitting: Nurse Practitioner

## 2012-11-05 DIAGNOSIS — R609 Edema, unspecified: Secondary | ICD-10-CM

## 2012-11-05 DIAGNOSIS — I10 Essential (primary) hypertension: Secondary | ICD-10-CM

## 2012-11-05 DIAGNOSIS — I350 Nonrheumatic aortic (valve) stenosis: Secondary | ICD-10-CM

## 2012-11-05 DIAGNOSIS — M1711 Unilateral primary osteoarthritis, right knee: Secondary | ICD-10-CM

## 2012-11-05 DIAGNOSIS — I4891 Unspecified atrial fibrillation: Secondary | ICD-10-CM

## 2012-11-05 DIAGNOSIS — M1710 Unilateral primary osteoarthritis, unspecified knee: Secondary | ICD-10-CM

## 2012-11-05 DIAGNOSIS — I509 Heart failure, unspecified: Secondary | ICD-10-CM

## 2012-11-05 NOTE — Progress Notes (Signed)
Patient ID: [name], female   DOB: 12-May-1915, 79 y.o.   MRN: [id number]  Chief Complaint:  Chief Complaint  Patient presents with  . Medical Managment of Chronic Issues    CHF     HPI:   The patient has more edema in her BLE, L>R saw Cardiology 11/02/12 and Furosemide was increased subsequently.   Review of Systems:  Review of Systems  Constitutional: Negative for fever, chills, weight loss, malaise/fatigue and diaphoresis.  HENT: Positive for hearing loss. Negative for ear pain, congestion, sore throat, neck pain and ear discharge.   Eyes: Negative for blurred vision, pain, discharge and redness.  Respiratory: Positive for shortness of breath. Negative for cough, sputum production and wheezing.   Cardiovascular: Positive for leg swelling and PND. Negative for chest pain, palpitations, orthopnea and claudication.  Gastrointestinal: Negative for nausea, vomiting, abdominal pain, diarrhea, constipation and blood in stool.  Genitourinary: Positive for frequency. Negative for dysuria, urgency and flank pain.  Musculoskeletal: Positive for joint pain. Negative for back pain.  Skin: Negative for itching and rash.  Neurological: Negative for tremors, sensory change, speech change, focal weakness, seizures, loss of consciousness, weakness and headaches.  Endo/Heme/Allergies: Negative for environmental allergies and polydipsia. Does not bruise/bleed easily.  Psychiatric/Behavioral: Positive for memory loss. Negative for hallucinations. The patient is not nervous/anxious and does not have insomnia.      Medications: Patient's Medications  New Prescriptions   No medications on file  Previous Medications   ACETAMINOPHEN (TYLENOL) 325 MG TABLET    Take 325 mg by mouth every 6 (six) hours as needed.     BETA CAROTENE W/MINERALS (OCUVITE) TABLET    Take 1 tablet by mouth daily.     CALCIUM-VITAMIN D (OSCAL WITH D) 500-200 MG-UNIT PER TABLET    Take 1 tablet by mouth 2 (two) times daily.      DOCUSATE SODIUM (COLACE) 100 MG CAPSULE    Take 100 mg by mouth 2 (two) times daily.     DULOXETINE (CYMBALTA) 30 MG CAPSULE    Take 30 mg by mouth daily.     FERROUS SULFATE 325 (65 FE) MG TABLET    Take 325 mg by mouth 2 (two) times daily with a meal.    FOLIC ACID (FOLVITE) 1 MG TABLET    Take 1 mg by mouth daily.     FUROSEMIDE (LASIX) 20 MG TABLET    Take 20 mg by mouth daily.     LEVOTHYROXINE (SYNTHROID, LEVOTHROID) 25 MCG TABLET    Take 25 mcg by mouth daily.   MAGNESIUM OXIDE (MAG-OX) 400 MG TABLET    Take 400 mg by mouth daily.     METOPROLOL SUCCINATE (TOPROL-XL) 25 MG 24 HR TABLET    Take 25 mg by mouth daily. Taking 1/2 tab    MULTIPLE VITAMIN (MULTIVITAMIN) TABLET    Take 1 tablet by mouth daily.     POTASSIUM CHLORIDE (MICRO-K) 10 MEQ CR CAPSULE    Take 10 mEq by mouth daily. Takes 20 meq (2) 10 meq daily    SENNOSIDES-DOCUSATE SODIUM (SENOKOT-S) 8.6-50 MG TABLET    Take by mouth daily.     WARFARIN (COUMADIN) 4 MG TABLET    Take 6 mg by mouth daily. Taking 6 mg alt with 5mg   Modified Medications   No medications on file  Discontinued Medications   No medications on file     Physical Exam: Physical Exam  Constitutional: She is oriented to person, place, and time. She  appears well-developed and well-nourished. No distress.  HENT:  Head: Normocephalic and atraumatic.  Eyes: Conjunctivae and EOM are normal. Pupils are equal, round, and reactive to light.  Neck: Normal range of motion. Neck supple. No JVD present. No thyromegaly present.  Cardiovascular: Normal rate and normal heart sounds.  Frequent extrasystoles are present.  Pulses:      Dorsalis pedis pulses are 2+ on the right side, and 1+ on the left side.  Pulmonary/Chest: She has no wheezes. She has no rales. She exhibits no tenderness.  Abdominal: Soft. Bowel sounds are normal.  Musculoskeletal: She exhibits edema (left knee).       Left shoulder: She exhibits decreased range of motion, tenderness, crepitus  and pain.       Legs: Anterior distal femur 2x2cm firm nodule palpated, fixed, hard, non tender  Lymphadenopathy:    She has no cervical adenopathy.  Neurological: She is alert and oriented to person, place, and time. She has normal reflexes. No cranial nerve deficit. She exhibits normal muscle tone. Coordination normal.  Skin: Skin is dry. No rash noted. She is not diaphoretic. There is erythema (left knee).  Psychiatric: She has a normal mood and affect. Her behavior is normal. Judgment and thought content normal. Cognition and memory are impaired. She exhibits abnormal recent memory.         Labs reviewed: Basic Metabolic Panel: No results found for this basename: NA, K, CL, CO2, GLUCOSE, BUN, CREATININE, CALCIUM, MG, PHOS, TSH,  in the last 8760 hours  Liver Function Tests: No results found for this basename: AST, ALT, ALKPHOS, BILITOT, PROT, ALBUMIN,  in the last 8760 hours  CBC:  Recent Labs  01/09/12 1024 04/15/12 1026  WBC 10.6* 11.8*  HGB 12.7 12.6  HCT 38.1 37.1  MCV 91 91  PLT 113* 120*    Significant Diagnostic Results:     Assessment/Plan Congestive heart failure, unspecified Saw Dr. Alanda Amass 11/02/12 increased to Furosemide to 20mg  and 40mg  alternating dose for increased edema, f/u CMP in 2 weeks, continue daily weight, compression hosiery   Osteoarthritis of right knee Continue with ROM as tolerated.   Atrial fibrillation Rate controlled, continue anticoagulation with Coumadin .   Unspecified essential hypertension controlled      Family/ staff Communication: daily weight, apply compression hosiery.    Goals of care: continue SNF   Labs/tests ordered CMP in 2 weeks.

## 2012-11-05 NOTE — Assessment & Plan Note (Signed)
Saw Dr. Alanda Amass 11/02/12 increased to Furosemide to 20mg  and 40mg  alternating dose for increased edema, f/u CMP in 2 weeks, continue daily weight, compression hosiery

## 2012-11-05 NOTE — Assessment & Plan Note (Signed)
Rate controlled, continue anticoagulation with Coumadin .

## 2012-11-05 NOTE — Assessment & Plan Note (Signed)
controlled 

## 2012-11-05 NOTE — Assessment & Plan Note (Signed)
Continue with ROM as tolerated.

## 2012-11-15 LAB — HEPATIC FUNCTION PANEL
ALT: 17 U/L (ref 7–35)
Alkaline Phosphatase: 46 U/L (ref 25–125)

## 2012-11-15 LAB — BASIC METABOLIC PANEL
BUN: 27 mg/dL — AB (ref 4–21)
Creatinine: 1.2 mg/dL — AB (ref 0.5–1.1)
Potassium: 4.5 mmol/L (ref 3.4–5.3)

## 2012-11-16 ENCOUNTER — Ambulatory Visit (HOSPITAL_COMMUNITY): Payer: Medicare Other

## 2012-11-19 ENCOUNTER — Ambulatory Visit (HOSPITAL_COMMUNITY)
Admission: RE | Admit: 2012-11-19 | Discharge: 2012-11-19 | Disposition: A | Discharge status: Home / Self Care | Payer: Medicare Other | Source: Ambulatory Visit | Attending: Cardiovascular Disease | Admitting: Cardiovascular Disease

## 2012-11-19 DIAGNOSIS — R609 Edema, unspecified: Secondary | ICD-10-CM

## 2012-11-19 DIAGNOSIS — I509 Heart failure, unspecified: Secondary | ICD-10-CM | POA: Insufficient documentation

## 2012-11-19 DIAGNOSIS — I359 Nonrheumatic aortic valve disorder, unspecified: Secondary | ICD-10-CM | POA: Insufficient documentation

## 2012-11-19 DIAGNOSIS — I350 Nonrheumatic aortic (valve) stenosis: Secondary | ICD-10-CM

## 2012-11-19 NOTE — Progress Notes (Signed)
[facility]   2D echo completed 11/19/2012.   Veda Canning, RDCS

## 2012-11-30 ENCOUNTER — Non-Acute Institutional Stay (SKILLED_NURSING_FACILITY): Payer: Medicare Other | Admitting: Nurse Practitioner

## 2012-11-30 DIAGNOSIS — E039 Hypothyroidism, unspecified: Secondary | ICD-10-CM

## 2012-11-30 DIAGNOSIS — I4891 Unspecified atrial fibrillation: Secondary | ICD-10-CM

## 2012-11-30 DIAGNOSIS — M25519 Pain in unspecified shoulder: Secondary | ICD-10-CM

## 2012-11-30 DIAGNOSIS — I509 Heart failure, unspecified: Secondary | ICD-10-CM

## 2012-11-30 DIAGNOSIS — I10 Essential (primary) hypertension: Secondary | ICD-10-CM

## 2012-11-30 DIAGNOSIS — M25512 Pain in left shoulder: Secondary | ICD-10-CM

## 2012-11-30 NOTE — Assessment & Plan Note (Signed)
Takes Levothyroxine , last TSH 4.405 10/25/12

## 2012-11-30 NOTE — Assessment & Plan Note (Signed)
Rate controlled, continue anticoagulation with Coumadin .  

## 2012-11-30 NOTE — Progress Notes (Signed)
Patient ID: [name], female   DOB: 1914/11/07, 79 y.o.   MRN: [id number]  Chief Complaint:  Chief Complaint  Patient presents with  . Medical Managment of Chronic Issues    left wrist pain     HPI:   Problem List Items Addressed This Visit     ICD-9-CM   Unspecified hypothyroidism     Takes Levothyroxine , last TSH 4.405 10/25/12    Unspecified essential hypertension     controlled      Atrial fibrillation     Rate controlled, continue anticoagulation with Coumadin .       Congestive heart failure, unspecified     Saw Dr. Alanda Amass 11/02/12 increased to Furosemide to 20mg  and 40mg  alternating dose for increased edema-mainly in her LLE--Bun/creat 4/21/4 27/1.21,  continue daily weight, compression hosiery      Pain in joint, shoulder region - Primary (Chronic)     Continue with ROM as tolerated. Left shoulder chronic pain with decreased ROM. Staff reported the patient c/o left wrist pain 11/17/12 and X-ray showed no evidence of acute fracture. The patient told it was her left shoulder pain--will increase Norco 1/2 q6hr for better pain control.          Review of Systems:  Review of Systems  Constitutional: Negative for fever, chills, weight loss, malaise/fatigue and diaphoresis.  HENT: Positive for hearing loss. Negative for ear pain, congestion, sore throat, neck pain and ear discharge.   Eyes: Negative for blurred vision, pain, discharge and redness.  Respiratory: Positive for shortness of breath. Negative for cough, sputum production and wheezing.   Cardiovascular: Positive for leg swelling and PND. Negative for chest pain, palpitations, orthopnea and claudication.  Gastrointestinal: Negative for nausea, vomiting, abdominal pain, diarrhea, constipation and blood in stool.  Genitourinary: Positive for frequency. Negative for dysuria, urgency and flank pain.  Musculoskeletal: Positive for joint pain. Negative for back pain.       Pain with decreased ROM of  the left shoulder   Skin: Negative for itching and rash.  Neurological: Negative for tremors, sensory change, speech change, focal weakness, seizures, loss of consciousness, weakness and headaches.  Endo/Heme/Allergies: Negative for environmental allergies and polydipsia. Does not bruise/bleed easily.  Psychiatric/Behavioral: Positive for memory loss. Negative for hallucinations. The patient is not nervous/anxious and does not have insomnia.      Medications: Patient's Medications  New Prescriptions   No medications on file  Previous Medications   ACETAMINOPHEN (TYLENOL) 325 MG TABLET    Take 325 mg by mouth every 6 (six) hours as needed.     BETA CAROTENE W/MINERALS (OCUVITE) TABLET    Take 1 tablet by mouth daily.     CALCIUM-VITAMIN D (OSCAL WITH D) 500-200 MG-UNIT PER TABLET    Take 1 tablet by mouth 2 (two) times daily.     DOCUSATE SODIUM (COLACE) 100 MG CAPSULE    Take 100 mg by mouth 2 (two) times daily.     DULOXETINE (CYMBALTA) 30 MG CAPSULE    Take 30 mg by mouth daily.     FERROUS SULFATE 325 (65 FE) MG TABLET    Take 325 mg by mouth 2 (two) times daily with a meal.    FOLIC ACID (FOLVITE) 1 MG TABLET    Take 1 mg by mouth daily.     FUROSEMIDE (LASIX) 20 MG TABLET    Take 20 mg by mouth daily.     LEVOTHYROXINE (SYNTHROID, LEVOTHROID) 25 MCG TABLET    Take  25 mcg by mouth daily.   MAGNESIUM OXIDE (MAG-OX) 400 MG TABLET    Take 400 mg by mouth daily.     METOPROLOL SUCCINATE (TOPROL-XL) 25 MG 24 HR TABLET    Take 25 mg by mouth daily. Taking 1/2 tab    MULTIPLE VITAMIN (MULTIVITAMIN) TABLET    Take 1 tablet by mouth daily.     POTASSIUM CHLORIDE (MICRO-K) 10 MEQ CR CAPSULE    Take 10 mEq by mouth daily. Takes 20 meq (2) 10 meq daily    SENNOSIDES-DOCUSATE SODIUM (SENOKOT-S) 8.6-50 MG TABLET    Take by mouth daily.     WARFARIN (COUMADIN) 4 MG TABLET    Take 6 mg by mouth daily. Taking 6 mg alt with 5mg   Modified Medications   No medications on file  Discontinued Medications     No medications on file     Physical Exam: Physical Exam  Constitutional: She is oriented to person, place, and time. She appears well-developed and well-nourished. No distress.  HENT:  Head: Normocephalic and atraumatic.  Eyes: Conjunctivae and EOM are normal. Pupils are equal, round, and reactive to light.  Neck: Normal range of motion. Neck supple. No JVD present. No thyromegaly present.  Cardiovascular: Normal rate and normal heart sounds.  Frequent extrasystoles are present.  Pulses:      Dorsalis pedis pulses are 2+ on the right side, and 1+ on the left side.  Pulmonary/Chest: She has no wheezes. She has no rales. She exhibits no tenderness.  Abdominal: Soft. Bowel sounds are normal.  Musculoskeletal: She exhibits edema (left knee).       Left shoulder: She exhibits decreased range of motion, tenderness, crepitus and pain.       Legs: Anterior distal femur 2x2cm firm nodule palpated, fixed, hard, non tender  Lymphadenopathy:    She has no cervical adenopathy.  Neurological: She is alert and oriented to person, place, and time. She has normal reflexes. No cranial nerve deficit. She exhibits normal muscle tone. Coordination normal.  Skin: Skin is dry. No rash noted. She is not diaphoretic. There is erythema (left knee).  Psychiatric: She has a normal mood and affect. Her behavior is normal. Judgment and thought content normal. Cognition and memory are impaired. She exhibits abnormal recent memory.     Filed Vitals:   11/23/12 1715  BP: 137/78  Pulse: 63  Temp: 98 F (36.7 C)  TempSrc: Tympanic  Resp: 18      Labs reviewed: Basic Metabolic Panel:  Recent Labs  16/10/96 11/15/12  NA  --  141  K  --  4.5  BUN  --  27*  CREATININE  --  1.2*  TSH 4.40  --     Liver Function Tests:  Recent Labs  11/15/12  AST 21  ALT 17  ALKPHOS 46    CBC:  Recent Labs  01/09/12 1024 04/15/12 1026 10/25/12  WBC 10.6* 11.8* 12.7  HGB 12.7 12.6 11.0*  HCT 38.1 37.1  32*  MCV 91 91  --   PLT 113* 120* 116*    Anemia Panel:  Recent Labs  01/09/12 1024 04/15/12 1026  IRON 57 49    Significant Diagnostic Results:   11/17/12 X-ray L wrist: no evidence of acute fracture.   Assessment/Plan Pain in joint, shoulder region Continue with ROM as tolerated. Left shoulder chronic pain with decreased ROM. Staff reported the patient c/o left wrist pain 11/17/12 and X-ray showed no evidence of acute fracture. The patient told  it was her left shoulder pain--will increase Norco 1/2 q6hr for better pain control.     Unspecified hypothyroidism Takes Levothyroxine , last TSH 4.405 10/25/12  Unspecified essential hypertension controlled    Atrial fibrillation Rate controlled, continue anticoagulation with Coumadin .     Congestive heart failure, unspecified Saw Dr. Alanda Amass 11/02/12 increased to Furosemide to 20mg  and 40mg  alternating dose for increased edema-mainly in her LLE--Bun/creat 4/21/4 27/1.21,  continue daily weight, compression hosiery        Family/ staff Communication: continue to monitor weight   Goals of care: SNF   Labs/tests ordered none

## 2012-11-30 NOTE — Assessment & Plan Note (Signed)
Continue with ROM as tolerated. Left shoulder chronic pain with decreased ROM. Staff reported the patient c/o left wrist pain 11/17/12 and X-ray showed no evidence of acute fracture. The patient told it was her left shoulder pain--will increase Norco 1/2 q6hr for better pain control.

## 2012-11-30 NOTE — Assessment & Plan Note (Signed)
controlled 

## 2012-11-30 NOTE — Assessment & Plan Note (Signed)
Saw Dr. Alanda Amass 11/02/12 increased to Furosemide to 20mg  and 40mg  alternating dose for increased edema-mainly in her LLE--Bun/creat 4/21/4 27/1.21,  continue daily weight, compression hosiery

## 2012-12-07 ENCOUNTER — Non-Acute Institutional Stay (SKILLED_NURSING_FACILITY): Payer: Medicare Other | Admitting: Nurse Practitioner

## 2012-12-07 DIAGNOSIS — F332 Major depressive disorder, recurrent severe without psychotic features: Secondary | ICD-10-CM

## 2012-12-07 DIAGNOSIS — E039 Hypothyroidism, unspecified: Secondary | ICD-10-CM

## 2012-12-07 DIAGNOSIS — M25519 Pain in unspecified shoulder: Secondary | ICD-10-CM

## 2012-12-07 DIAGNOSIS — I4891 Unspecified atrial fibrillation: Secondary | ICD-10-CM

## 2012-12-07 DIAGNOSIS — M25512 Pain in left shoulder: Secondary | ICD-10-CM

## 2012-12-07 DIAGNOSIS — I10 Essential (primary) hypertension: Secondary | ICD-10-CM

## 2012-12-07 DIAGNOSIS — I509 Heart failure, unspecified: Secondary | ICD-10-CM

## 2012-12-07 NOTE — Assessment & Plan Note (Signed)
Takes Levothyroxine 50mcg, last TSH 4.405 10/25/12 

## 2012-12-07 NOTE — Assessment & Plan Note (Signed)
Stable on Cymbalta 30mg  

## 2012-12-07 NOTE — Assessment & Plan Note (Signed)
Saw Dr. Weintraub 11/02/12 increased to Furosemide to 20mg and 40mg alternating dose for increased edema-mainly in her LLE--Bun/creat 4/21/4 27/1.21,  continue daily weight, compression hosiery   

## 2012-12-07 NOTE — Progress Notes (Signed)
Patient ID: [name], female   DOB: 16-Feb-1915, 79 y.o.   MRN: [id number]  Chief Complaint:  Chief Complaint  Patient presents with  . Medical Managment of Chronic Issues     HPI:   Problem List Items Addressed This Visit     ICD-9-CM   Unspecified hypothyroidism     Takes Levothyroxine , last TSH 4.405 10/25/12      Major depressive disorder, recurrent episode, severe, without mention of psychotic behavior     Stable on Cymbalta 30mg     Atrial fibrillation     Rate controlled, continue anticoagulation with Coumadin .         Congestive heart failure, unspecified     Saw Dr. Alanda Amass 11/02/12 increased to Furosemide to 20mg  and 40mg  alternating dose for increased edema-mainly in her LLE--Bun/creat 4/21/4 27/1.21,  continue daily weight, compression hosiery        Unspecified essential hypertension (Chronic)     Controlled on Metoprolol 12.5mg  daily        Pain in joint, shoulder region - Primary (Chronic)     Continue with ROM as tolerated. Left shoulder chronic pain with decreased ROM. Staff reported the patient c/o left wrist pain 11/17/12 and X-ray showed no evidence of acute fracture. The patient told it was her left shoulder pain--better with increased Norco 1/2 q6hr for better pain control.            Review of Systems:  Review of Systems  Constitutional: Negative for fever, chills, weight loss, malaise/fatigue and diaphoresis.  HENT: Positive for hearing loss. Negative for ear pain, congestion, sore throat, neck pain and ear discharge.   Eyes: Negative for blurred vision, pain, discharge and redness.  Respiratory: Positive for shortness of breath. Negative for cough, sputum production and wheezing.   Cardiovascular: Positive for leg swelling and PND. Negative for chest pain, palpitations, orthopnea and claudication.  Gastrointestinal: Negative for nausea, vomiting, abdominal pain, diarrhea, constipation and blood in stool.  Genitourinary:  Positive for frequency. Negative for dysuria, urgency and flank pain.  Musculoskeletal: Positive for joint pain. Negative for back pain.       Pain with decreased ROM of the left shoulder   Skin: Negative for itching and rash.  Neurological: Negative for tremors, sensory change, speech change, focal weakness, seizures, loss of consciousness, weakness and headaches.  Endo/Heme/Allergies: Negative for environmental allergies and polydipsia. Does not bruise/bleed easily.  Psychiatric/Behavioral: Positive for memory loss. Negative for hallucinations. The patient is not nervous/anxious and does not have insomnia.      Medications: Patient's Medications  New Prescriptions   No medications on file  Previous Medications   ACETAMINOPHEN (TYLENOL) 325 MG TABLET    Take 325 mg by mouth every 6 (six) hours as needed.     BETA CAROTENE W/MINERALS (OCUVITE) TABLET    Take 1 tablet by mouth daily.     CALCIUM-VITAMIN D (OSCAL WITH D) 500-200 MG-UNIT PER TABLET    Take 1 tablet by mouth 2 (two) times daily.     DOCUSATE SODIUM (COLACE) 100 MG CAPSULE    Take 100 mg by mouth 2 (two) times daily.     DULOXETINE (CYMBALTA) 30 MG CAPSULE    Take 30 mg by mouth daily.     FERROUS SULFATE 325 (65 FE) MG TABLET    Take 325 mg by mouth 2 (two) times daily with a meal.    FOLIC ACID (FOLVITE) 1 MG TABLET    Take 1 mg by mouth daily.  FUROSEMIDE (LASIX) 20 MG TABLET    Take 20 mg by mouth daily.     LEVOTHYROXINE (SYNTHROID, LEVOTHROID) 25 MCG TABLET    Take 25 mcg by mouth daily.   MAGNESIUM OXIDE (MAG-OX) 400 MG TABLET    Take 400 mg by mouth daily.     METOPROLOL SUCCINATE (TOPROL-XL) 25 MG 24 HR TABLET    Take 25 mg by mouth daily. Taking 1/2 tab    MULTIPLE VITAMIN (MULTIVITAMIN) TABLET    Take 1 tablet by mouth daily.     POTASSIUM CHLORIDE (MICRO-K) 10 MEQ CR CAPSULE    Take 10 mEq by mouth daily. Takes 20 meq (2) 10 meq daily    SENNOSIDES-DOCUSATE SODIUM (SENOKOT-S) 8.6-50 MG TABLET    Take by mouth  daily.     WARFARIN (COUMADIN) 4 MG TABLET    Take 6 mg by mouth daily. Taking 6 mg alt with 5mg   Modified Medications   No medications on file  Discontinued Medications   No medications on file     Physical Exam: Physical Exam  Constitutional: She is oriented to person, place, and time. She appears well-developed and well-nourished. No distress.  HENT:  Head: Normocephalic and atraumatic.  Eyes: Conjunctivae and EOM are normal. Pupils are equal, round, and reactive to light.  Neck: Normal range of motion. Neck supple. No JVD present. No thyromegaly present.  Cardiovascular: Normal rate and normal heart sounds.  Frequent extrasystoles are present.  Pulses:      Dorsalis pedis pulses are 2+ on the right side, and 1+ on the left side.  Pulmonary/Chest: She has no wheezes. She has no rales. She exhibits no tenderness.  Abdominal: Soft. Bowel sounds are normal.  Musculoskeletal: She exhibits edema (left knee).       Left shoulder: She exhibits decreased range of motion, tenderness, crepitus and pain.       Legs: Anterior distal femur 2x2cm firm nodule palpated, fixed, hard, non tender  Lymphadenopathy:    She has no cervical adenopathy.  Neurological: She is alert and oriented to person, place, and time. She has normal reflexes. No cranial nerve deficit. She exhibits normal muscle tone. Coordination normal.  Skin: Skin is dry. No rash noted. She is not diaphoretic. There is erythema (left knee).  Psychiatric: She has a normal mood and affect. Her behavior is normal. Judgment and thought content normal. Cognition and memory are impaired. She exhibits abnormal recent memory.     Filed Vitals:   12/07/12 1405  BP: 114/72  Pulse: 62  Temp: 98.8 F (37.1 C)  TempSrc: Tympanic  Resp: 20      Labs reviewed: Basic Metabolic Panel:  Recent Labs  16/10/96 11/15/12  NA  --  141  K  --  4.5  BUN  --  27*  CREATININE  --  1.2*  TSH 4.40  --     Liver Function Tests:  Recent  Labs  11/15/12  AST 21  ALT 17  ALKPHOS 46    CBC:  Recent Labs  01/09/12 1024 04/15/12 1026 10/25/12  WBC 10.6* 11.8* 12.7  HGB 12.7 12.6 11.0*  HCT 38.1 37.1 32*  MCV 91 91  --   PLT 113* 120* 116*    Anemia Panel:  Recent Labs  01/09/12 1024 04/15/12 1026  IRON 57 49    Significant Diagnostic Results:     Assessment/Plan Pain in joint, shoulder region Continue with ROM as tolerated. Left shoulder chronic pain with decreased ROM. Staff reported  the patient c/o left wrist pain 11/17/12 and X-ray showed no evidence of acute fracture. The patient told it was her left shoulder pain--better with increased Norco 1/2 q6hr for better pain control.       Congestive heart failure, unspecified Saw Dr. Alanda Amass 11/02/12 increased to Furosemide to 20mg  and 40mg  alternating dose for increased edema-mainly in her LLE--Bun/creat 4/21/4 27/1.21,  continue daily weight, compression hosiery      Unspecified hypothyroidism Takes Levothyroxine , last TSH 4.405 10/25/12    Major depressive disorder, recurrent episode, severe, without mention of psychotic behavior Stable on Cymbalta 30mg   Unspecified essential hypertension Controlled on Metoprolol 12.5mg  daily      Atrial fibrillation Rate controlled, continue anticoagulation with Coumadin .           Family/ staff Communication: none   Goals of care: SNF   Labs/tests ordered none

## 2012-12-07 NOTE — Assessment & Plan Note (Signed)
Rate controlled, continue anticoagulation with Coumadin .  

## 2012-12-07 NOTE — Assessment & Plan Note (Signed)
Continue with ROM as tolerated. Left shoulder chronic pain with decreased ROM. Staff reported the patient c/o left wrist pain 11/17/12 and X-ray showed no evidence of acute fracture. The patient told it was her left shoulder pain--better with increased Norco 1/2 q6hr for better pain control.

## 2012-12-07 NOTE — Assessment & Plan Note (Signed)
Controlled on Metoprolol 12.5mg  daily

## 2012-12-31 ENCOUNTER — Non-Acute Institutional Stay (SKILLED_NURSING_FACILITY): Payer: Medicare Other | Admitting: Nurse Practitioner

## 2012-12-31 DIAGNOSIS — M25519 Pain in unspecified shoulder: Secondary | ICD-10-CM

## 2012-12-31 DIAGNOSIS — I4891 Unspecified atrial fibrillation: Secondary | ICD-10-CM

## 2012-12-31 DIAGNOSIS — F332 Major depressive disorder, recurrent severe without psychotic features: Secondary | ICD-10-CM

## 2012-12-31 DIAGNOSIS — I509 Heart failure, unspecified: Secondary | ICD-10-CM

## 2012-12-31 DIAGNOSIS — E039 Hypothyroidism, unspecified: Secondary | ICD-10-CM

## 2012-12-31 DIAGNOSIS — M25512 Pain in left shoulder: Secondary | ICD-10-CM

## 2012-12-31 NOTE — Progress Notes (Signed)
Patient ID: [name], female   DOB: 11-09-1914, 79 y.o.   MRN: [id number]  Chief Complaint:  Chief Complaint  Patient presents with  . Medical Managment of Chronic Issues     HPI:   Problem List Items Addressed This Visit   Pain in joint, shoulder region - Primary (Chronic)     Continue with ROM as tolerated. Left shoulder chronic pain with decreased ROM. Norco was reduced to 1/2 qhs along with Tylenol bid per the patient's request.         Unspecified hypothyroidism     Takes Levothyroxine , last TSH 4.405 10/25/12        Major depressive disorder, recurrent episode, severe, without mention of psychotic behavior     Stable on Cymbalta 30mg       Atrial fibrillation     Rate controlled on Metoprolol 12.5mg ,  continue anticoagulation with Coumadin .           Congestive heart failure, unspecified     Saw Dr. Alanda Amass 11/02/12 increased to Furosemide to 20mg  and 40mg  alternating dose for increased edema-mainly in her LLE--Bun/creat 4/21/4 27/1.21,  continue daily weight, compression hosiery             Review of Systems: Review of Systems  Constitutional: Negative for fever, chills, weight loss, malaise/fatigue and diaphoresis.  HENT: Positive for hearing loss. Negative for ear pain, congestion, sore throat, neck pain and ear discharge.   Eyes: Negative for blurred vision, pain, discharge and redness.  Respiratory: Positive for shortness of breath. Negative for cough, sputum production and wheezing.   Cardiovascular: Positive for leg swelling and PND. Negative for chest pain, palpitations, orthopnea and claudication.  Gastrointestinal: Negative for nausea, vomiting, abdominal pain, diarrhea, constipation and blood in stool.  Genitourinary: Positive for frequency. Negative for dysuria, urgency and flank pain.  Musculoskeletal: Positive for joint pain. Negative for back pain.       Pain with decreased ROM of the left shoulder   Skin: Negative for  itching and rash.  Neurological: Negative for tremors, sensory change, speech change, focal weakness, seizures, loss of consciousness, weakness and headaches.  Endo/Heme/Allergies: Negative for environmental allergies and polydipsia. Does not bruise/bleed easily.  Psychiatric/Behavioral: Positive for memory loss. Negative for hallucinations. The patient is not nervous/anxious and does not have insomnia.      Medications: Patient's Medications  New Prescriptions   No medications on file  Previous Medications   ACETAMINOPHEN (TYLENOL) 325 MG TABLET    Take 325 mg by mouth every 6 (six) hours as needed.     BETA CAROTENE W/MINERALS (OCUVITE) TABLET    Take 1 tablet by mouth daily.     CALCIUM-VITAMIN D (OSCAL WITH D) 500-200 MG-UNIT PER TABLET    Take 1 tablet by mouth 2 (two) times daily.     DOCUSATE SODIUM (COLACE) 100 MG CAPSULE    Take 100 mg by mouth 2 (two) times daily.     DULOXETINE (CYMBALTA) 30 MG CAPSULE    Take 30 mg by mouth daily.     FERROUS SULFATE 325 (65 FE) MG TABLET    Take 325 mg by mouth 2 (two) times daily with a meal.    FOLIC ACID (FOLVITE) 1 MG TABLET    Take 1 mg by mouth daily.     FUROSEMIDE (LASIX) 20 MG TABLET    Take 20 mg by mouth daily.     LEVOTHYROXINE (SYNTHROID, LEVOTHROID) 25 MCG TABLET    Take 25 mcg by  mouth daily.   MAGNESIUM OXIDE (MAG-OX) 400 MG TABLET    Take 400 mg by mouth daily.     METOPROLOL SUCCINATE (TOPROL-XL) 25 MG 24 HR TABLET    Take 25 mg by mouth daily. Taking 1/2 tab    MULTIPLE VITAMIN (MULTIVITAMIN) TABLET    Take 1 tablet by mouth daily.     POTASSIUM CHLORIDE (MICRO-K) 10 MEQ CR CAPSULE    Take 10 mEq by mouth daily. Takes 20 meq (2) 10 meq daily    SENNOSIDES-DOCUSATE SODIUM (SENOKOT-S) 8.6-50 MG TABLET    Take by mouth daily.     WARFARIN (COUMADIN) 4 MG TABLET    Take 6 mg by mouth daily. Taking 6 mg alt with 5mg   Modified Medications   No medications on file  Discontinued Medications   No medications on file      Physical Exam: Physical Exam  Constitutional: She is oriented to person, place, and time. She appears well-developed and well-nourished. No distress.  HENT:  Head: Normocephalic and atraumatic.  Eyes: Conjunctivae and EOM are normal. Pupils are equal, round, and reactive to light.  Neck: Normal range of motion. Neck supple. No JVD present. No thyromegaly present.  Cardiovascular: Normal rate and normal heart sounds.  Frequent extrasystoles are present.  Pulses:      Dorsalis pedis pulses are 2+ on the right side, and 1+ on the left side.  Pulmonary/Chest: She has no wheezes. She has no rales. She exhibits no tenderness.  Abdominal: Soft. Bowel sounds are normal.  Musculoskeletal: She exhibits edema (left knee).       Left shoulder: She exhibits decreased range of motion, tenderness, crepitus and pain.       Legs: Anterior distal femur 2x2cm firm nodule palpated, fixed, hard, non tender  Lymphadenopathy:    She has no cervical adenopathy.  Neurological: She is alert and oriented to person, place, and time. She has normal reflexes. No cranial nerve deficit. She exhibits normal muscle tone. Coordination normal.  Skin: Skin is dry. No rash noted. She is not diaphoretic. There is erythema (left knee).  Psychiatric: She has a normal mood and affect. Her behavior is normal. Judgment and thought content normal. Cognition and memory are impaired. She exhibits abnormal recent memory.     Filed Vitals:   12/31/12 1505  BP: 124/52  Pulse: 62  Temp: 97.9 F (36.6 C)  TempSrc: Tympanic  Resp: 18      Labs reviewed: Basic Metabolic Panel:  Recent Labs  64/33/29 11/15/12  NA  --  141  K  --  4.5  BUN  --  27*  CREATININE  --  1.2*  TSH 4.40  --     Liver Function Tests:  Recent Labs  11/15/12  AST 21  ALT 17  ALKPHOS 46    CBC:  Recent Labs  01/09/12 1024 04/15/12 1026 10/25/12  WBC 10.6* 11.8* 12.7  HGB 12.7 12.6 11.0*  HCT 38.1 37.1 32*  MCV 91 91  --    PLT 113* 120* 116*    Anemia Panel:  Recent Labs  01/09/12 1024 04/15/12 1026  IRON 57 49    Significant Diagnostic Results:     Assessment/Plan Pain in joint, shoulder region Continue with ROM as tolerated. Left shoulder chronic pain with decreased ROM. Norco was reduced to 1/2 qhs along with Tylenol bid per the patient's request.       Congestive heart failure, unspecified Saw Dr. Alanda Amass 11/02/12 increased to Furosemide to 20mg   and 40mg  alternating dose for increased edema-mainly in her LLE--Bun/creat 4/21/4 27/1.21,  continue daily weight, compression hosiery        Unspecified hypothyroidism Takes Levothyroxine , last TSH 4.405 10/25/12      Major depressive disorder, recurrent episode, severe, without mention of psychotic behavior Stable on Cymbalta 30mg     Atrial fibrillation Rate controlled on Metoprolol 12.5mg ,  continue anticoagulation with Coumadin .             Family/ staff Communication: monitor the patient   Goals of care: SNF   Labs/tests ordered none

## 2012-12-31 NOTE — Assessment & Plan Note (Signed)
Stable on Cymbalta 30mg  

## 2012-12-31 NOTE — Assessment & Plan Note (Signed)
Saw Dr. Weintraub 11/02/12 increased to Furosemide to 20mg and 40mg alternating dose for increased edema-mainly in her LLE--Bun/creat 4/21/4 27/1.21,  continue daily weight, compression hosiery   

## 2012-12-31 NOTE — Assessment & Plan Note (Signed)
Takes Levothyroxine 50mcg, last TSH 4.405 10/25/12 

## 2012-12-31 NOTE — Assessment & Plan Note (Signed)
Rate controlled on Metoprolol 12.5mg ,  continue anticoagulation with Coumadin .

## 2012-12-31 NOTE — Assessment & Plan Note (Signed)
Continue with ROM as tolerated. Left shoulder chronic pain with decreased ROM. Norco was reduced to 1/2 qhs along with Tylenol bid per the patient's request.

## 2013-02-01 IMAGING — CR DG CHEST 2V
2 series · 2 of 2 positions shown · non-contrast
Comparison: 12/20/2010

CLINICAL DATA: Shortness of breath.  Fever.  Nausea and vomiting.

CHEST - 2 VIEW

[w chest lat]
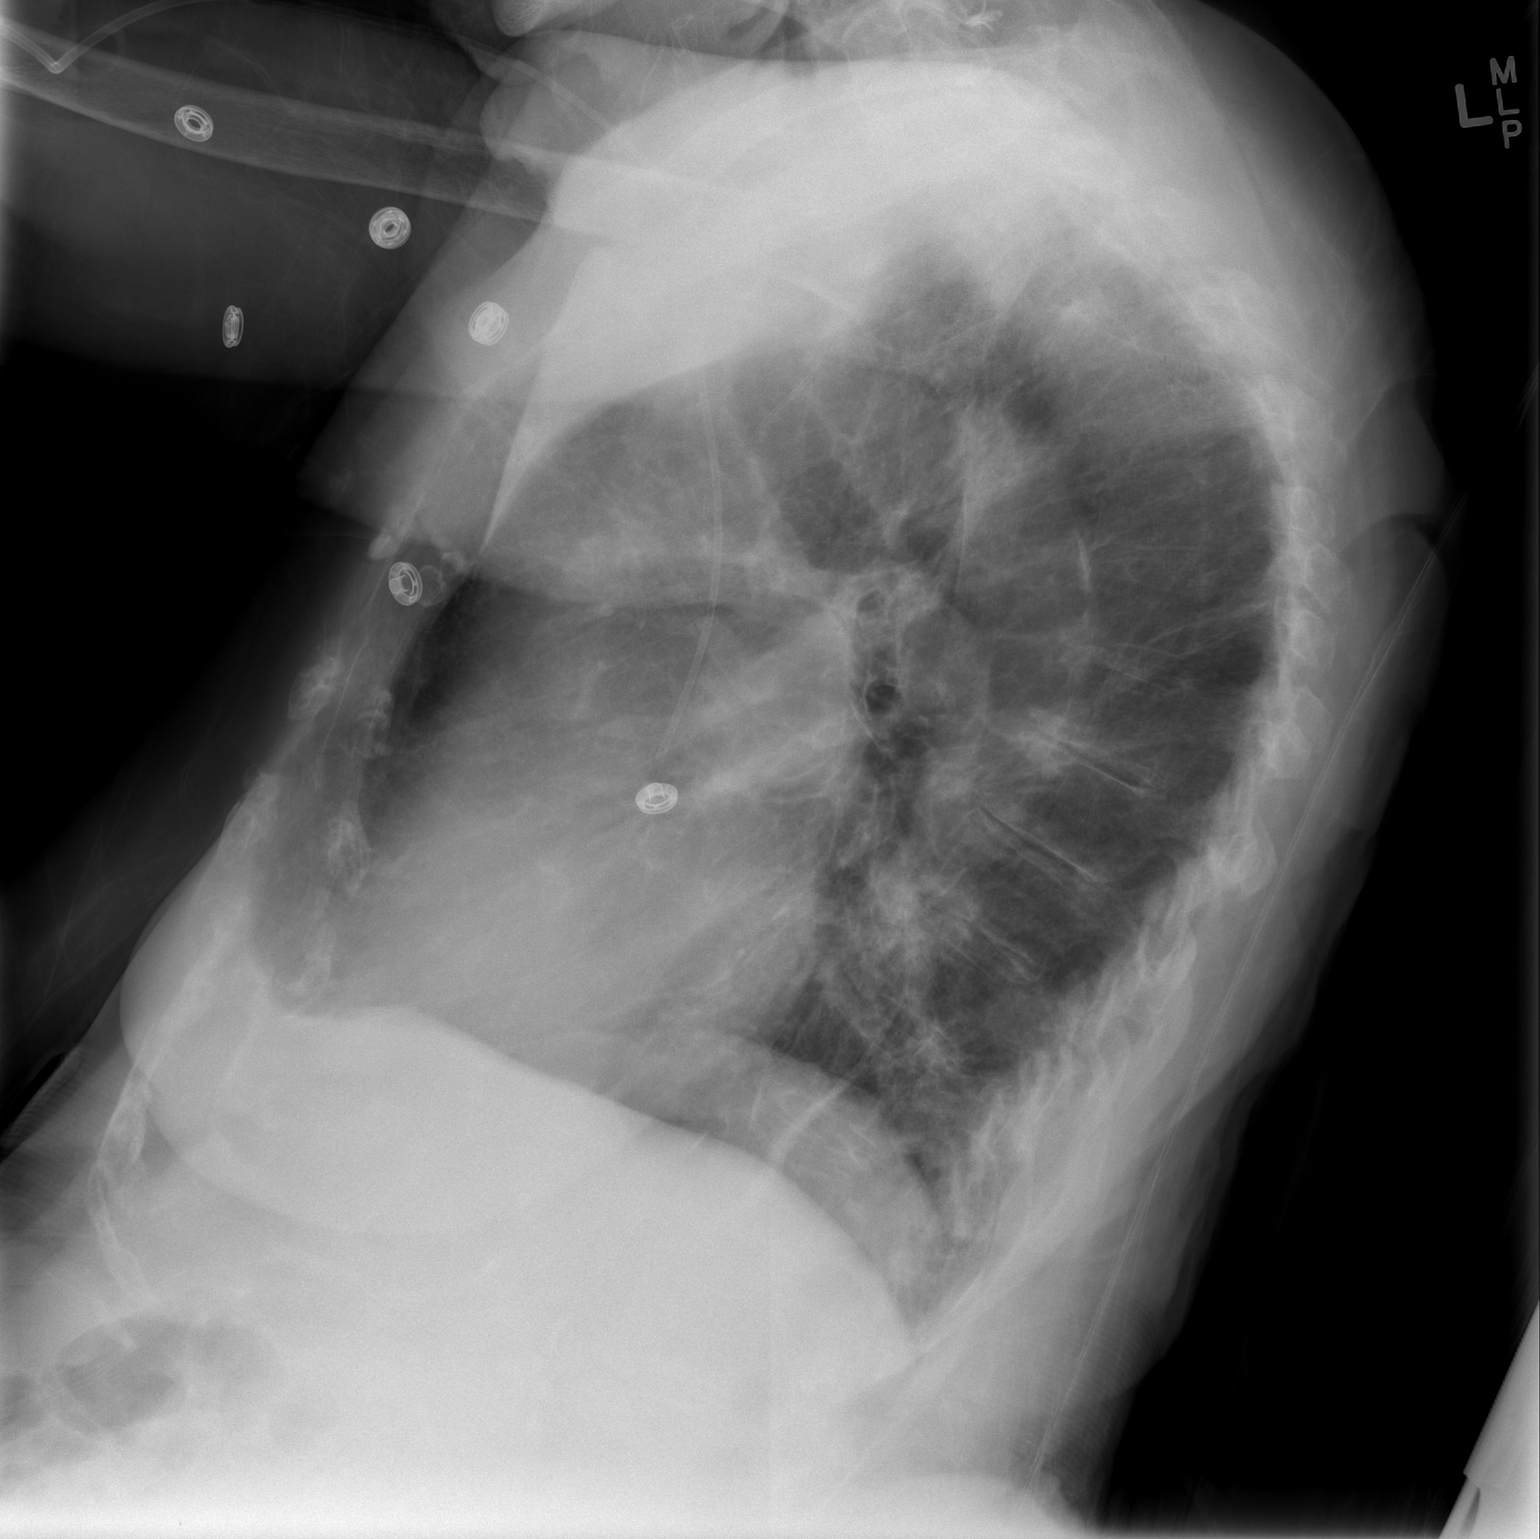

[view not recorded]
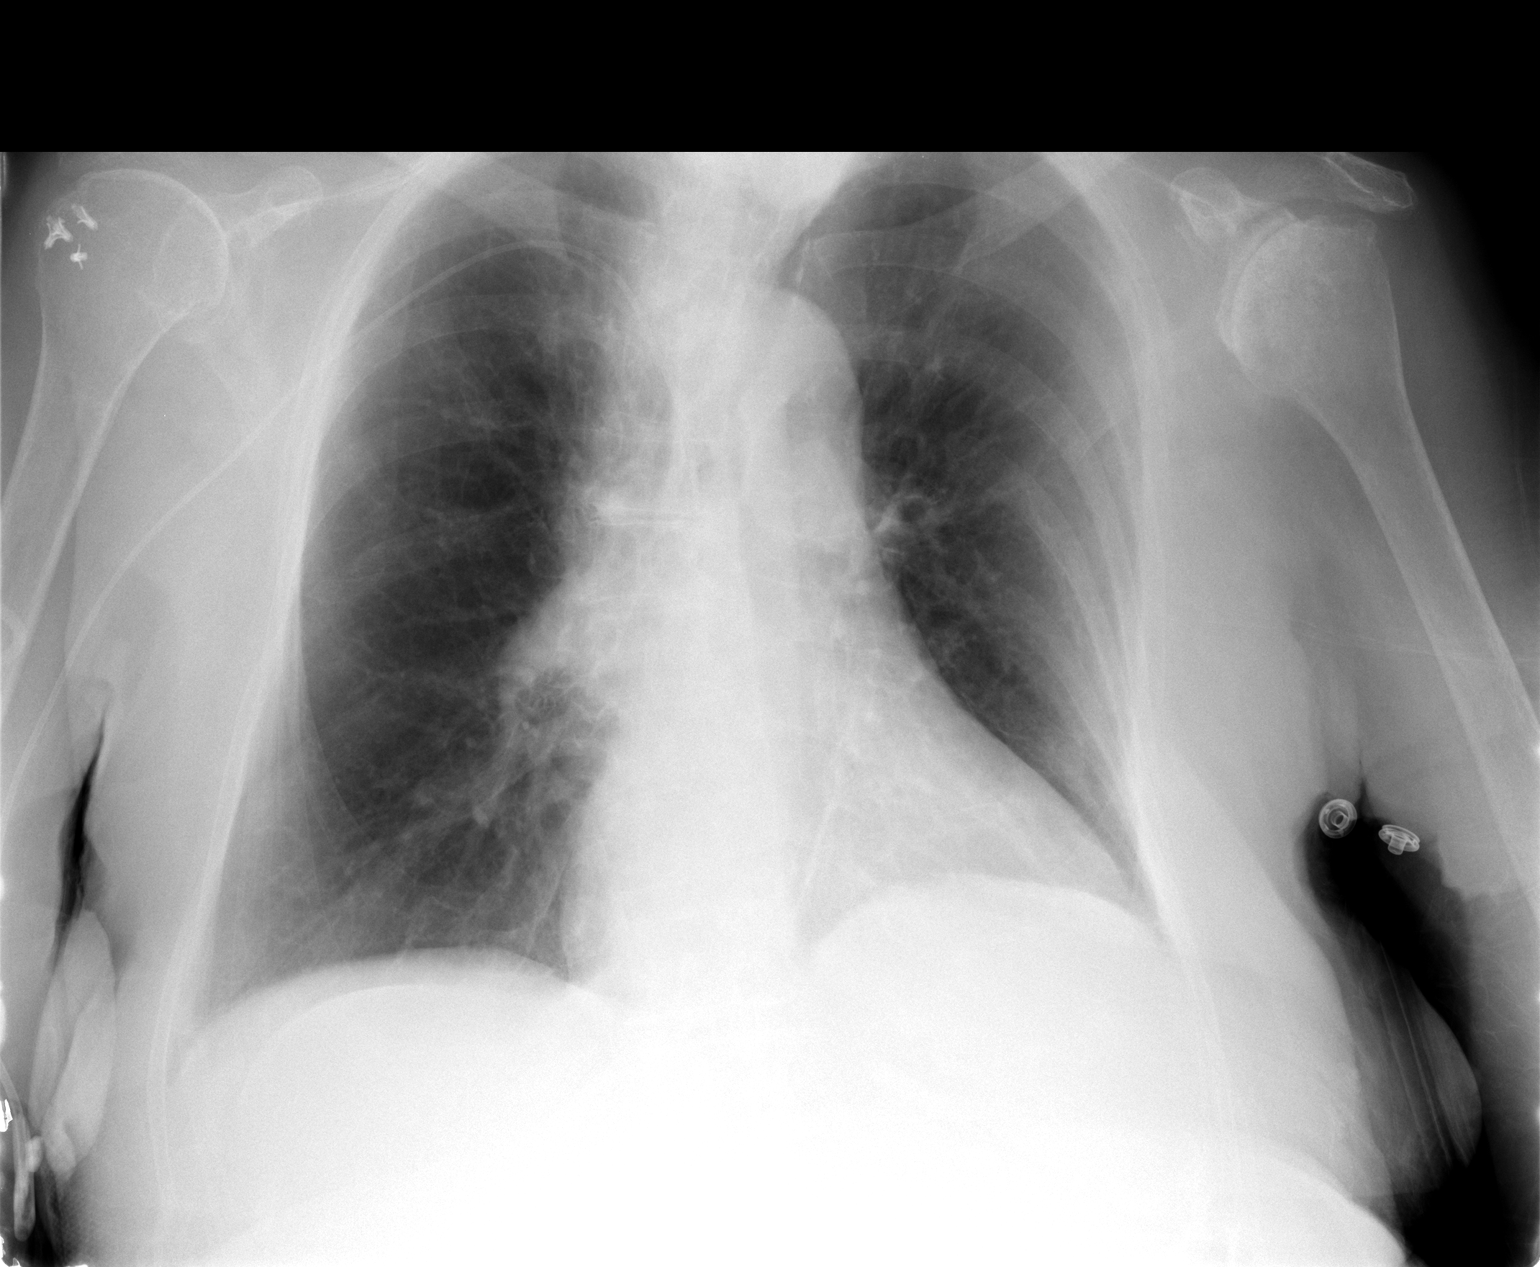

[2 of 2 positions shown; findings below may reference images not displayed]

FINDINGS: Cardiomegaly is stable.  Pulmonary hyperinflation again
seen, consistent with COPD. Left lung scarring is unchanged.  No
evidence of acute infiltrate or edema.  No evidence of pleural
effusion.

Right arm PICC line is seen with tip in the mid SVC. Thoracic spine
degenerative changes and dextroscoliosis are stable.
IMPRESSION: Stable cardiomegaly and COPD.  No acute findings.

## 2013-02-17 ENCOUNTER — Non-Acute Institutional Stay (SKILLED_NURSING_FACILITY): Payer: Medicare Other | Admitting: Nurse Practitioner

## 2013-02-17 ENCOUNTER — Encounter: Payer: Self-pay | Admitting: Nurse Practitioner

## 2013-02-17 DIAGNOSIS — D509 Iron deficiency anemia, unspecified: Secondary | ICD-10-CM

## 2013-02-17 DIAGNOSIS — E039 Hypothyroidism, unspecified: Secondary | ICD-10-CM

## 2013-02-17 DIAGNOSIS — F332 Major depressive disorder, recurrent severe without psychotic features: Secondary | ICD-10-CM

## 2013-02-17 DIAGNOSIS — N390 Urinary tract infection, site not specified: Secondary | ICD-10-CM

## 2013-02-17 DIAGNOSIS — I4891 Unspecified atrial fibrillation: Secondary | ICD-10-CM

## 2013-02-17 DIAGNOSIS — I509 Heart failure, unspecified: Secondary | ICD-10-CM

## 2013-02-17 NOTE — Assessment & Plan Note (Signed)
Rate controlled, continue anticoagulation with Coumadin . The trend of PT/INR was up while on Septra DS 11/12/ 2013 while she was treated for UTI--hold Coumadin 02/19/13--PT/INR 02/21/13.

## 2013-02-17 NOTE — Assessment & Plan Note (Addendum)
Urinary culture 02/17/13-P. Mirabilis-Septra DS bid for 7 days(the patient had fall from transferring herself which the patient is unable to and no attempts in the past-consider her new confusion behaviors w/o focal neurological symptoms)

## 2013-02-17 NOTE — Assessment & Plan Note (Signed)
Update CBC. 

## 2013-02-17 NOTE — Assessment & Plan Note (Signed)
Takes Levothyroxine , last TSH 4.405 10/25/12, update TSH

## 2013-02-17 NOTE — Assessment & Plan Note (Signed)
Stable on Cymbalta 30mg  

## 2013-02-17 NOTE — Progress Notes (Signed)
Patient ID: [name], female   DOB: 03-Aug-1914, 79 y.o.   MRN: [id number] Code Status: DNR  Allergies  Allergen Reactions  . Amiodarone   . Penicillins   . Amoxicillin   . Celecoxib   . Ciprofloxacin   . Codeine   . Diltiazem   . Lactobacillus (Acidophilus Lactobacillus)   . Metoprolol Succinate     All Beta-blockers  . Metronidazole Hcl   . Other     floricene dye netribudazike    Chief Complaint  Patient presents with  . Medical Managment of Chronic Issues    change in behaviors-more confused, UTI    HPI: Patient is a 79 y.o. female seen in the SNF at [facility]  today for evaluation of UTI, Coumadin management, and other chronic medical conditions.  Problem List Items Addressed This Visit   Atrial fibrillation     Rate controlled, continue anticoagulation with Coumadin . The trend of PT/INR was up while on Septra DS 11/12/ 2013 while she was treated for UTI--hold Coumadin 02/19/13--PT/INR 02/21/13.          Congestive heart failure, unspecified     Saw Dr. Alanda Amass 11/02/12 increased to Furosemide to 20mg  and 40mg  alternating dose for increased edema-mainly in her LLE--Bun/creat 4/21/4 27/1.21,  continue daily weight, compression hosiery, update CMP            Iron deficiency anemia, unspecified     Update CBC    Major depressive disorder, recurrent episode, severe, without mention of psychotic behavior     Stable on Cymbalta 30mg         Unspecified hypothyroidism     Takes Levothyroxine , last TSH 4.405 10/25/12, update TSH        Urinary tract infection, site not specified - Primary     Urinary culture 02/17/13-P. Mirabilis-Septra DS bid for 7 days(the patient had fall from transferring herself which the patient is unable to and no attempts in the past-consider her new confusion behaviors w/o focal neurological symptoms)       Review of Systems:   Review of Systems  Constitutional: Negative for fever, chills, weight loss,  malaise/fatigue and diaphoresis.  HENT: Positive for hearing loss. Negative for ear pain, congestion, sore throat, neck pain and ear discharge.   Eyes: Negative for blurred vision, pain, discharge and redness.  Respiratory: Positive for shortness of breath. Negative for cough, sputum production and wheezing.   Cardiovascular: Positive for leg swelling and PND. Negative for chest pain, palpitations, orthopnea and claudication.  Gastrointestinal: Negative for nausea, vomiting, abdominal pain, diarrhea, constipation and blood in stool.  Genitourinary: Positive for frequency. Negative for dysuria, urgency and flank pain.  Musculoskeletal: Positive for joint pain. Negative for back pain.       Pain with decreased ROM of the left shoulder   Skin: Negative for itching and rash.  Neurological: Negative for tremors, sensory change, speech change, focal weakness, seizures, loss of consciousness, weakness and headaches.  Endo/Heme/Allergies: Negative for environmental allergies and polydipsia. Does not bruise/bleed easily.  Psychiatric/Behavioral: Positive for memory loss. Negative for hallucinations. The patient is not nervous/anxious and does not have insomnia.     Medications: Reviewed at [facility]   Physical Exam: Physical Exam  Constitutional: She is oriented to person, place, and time. She appears well-developed and well-nourished. No distress.  HENT:  Head: Normocephalic and atraumatic.  Eyes: Conjunctivae and EOM are normal. Pupils are equal, round, and reactive to light.  Neck: Normal range of motion. Neck supple.  No JVD present. No thyromegaly present.  Cardiovascular: Normal rate and normal heart sounds.  Frequent extrasystoles are present.  Pulses:      Dorsalis pedis pulses are 2+ on the right side, and 1+ on the left side.  Pulmonary/Chest: She has no wheezes. She has no rales. She exhibits no tenderness.  Abdominal: Soft. Bowel sounds are normal.  Musculoskeletal: She exhibits edema  (left knee).       Left shoulder: She exhibits decreased range of motion, tenderness, crepitus and pain.       Legs: Anterior distal femur 2x2cm firm nodule palpated, fixed, hard, non tender  Lymphadenopathy:    She has no cervical adenopathy.  Neurological: She is alert and oriented to person, place, and time. She has normal reflexes. No cranial nerve deficit. She exhibits normal muscle tone. Coordination normal.  Skin: Skin is dry. No rash noted. She is not diaphoretic. There is erythema (left knee).  Psychiatric: She has a normal mood and affect. Her behavior is normal. Judgment and thought content normal. Cognition and memory are impaired. She exhibits abnormal recent memory.    Filed Vitals:   02/17/13 1044  BP: 126/71  Pulse: 67  Temp: 96.9 F (36.1 C)  TempSrc: Tympanic  Resp: 20      Labs reviewed: Basic Metabolic Panel:  Recent Labs  16/10/96 11/15/12  NA  --  141  K  --  4.5  BUN  --  27*  CREATININE  --  1.2*  TSH 4.40  --    Liver Function Tests:  Recent Labs  11/15/12  AST 21  ALT 17  ALKPHOS 46    CBC:  Recent Labs  04/15/12 1026 10/25/12  WBC 11.8* 12.7  HGB 12.6 11.0*  HCT 37.1 32*  MCV 91  --   PLT 120* 116*     Recent Labs  04/15/12 1026  IRON 49        Assessment/Plan Urinary tract infection, site not specified Urinary culture 02/17/13-P. Mirabilis-Septra DS bid for 7 days(the patient had fall from transferring herself which the patient is unable to and no attempts in the past-consider her new confusion behaviors w/o focal neurological symptoms)  Unspecified hypothyroidism Takes Levothyroxine , last TSH 4.405 10/25/12, update TSH      Major depressive disorder, recurrent episode, severe, without mention of psychotic behavior Stable on Cymbalta 30mg       Atrial fibrillation Rate controlled, continue anticoagulation with Coumadin . The trend of PT/INR was up while on Septra DS 11/12/ 2013 while she was treated  for UTI--hold Coumadin 02/19/13--PT/INR 02/21/13.        Congestive heart failure, unspecified Saw Dr. Alanda Amass 11/02/12 increased to Furosemide to 20mg  and 40mg  alternating dose for increased edema-mainly in her LLE--Bun/creat 4/21/4 27/1.21,  continue daily weight, compression hosiery, update CMP          Iron deficiency anemia, unspecified Update CBC    Family/ Staff Communication: observe the patient  Goals of Care: SNF  Labs/tests ordered: CBC, CMP, TSH, PT/INR

## 2013-02-17 NOTE — Assessment & Plan Note (Signed)
Saw Dr. Alanda Amass 11/02/12 increased to Furosemide to 20mg  and 40mg  alternating dose for increased edema-mainly in her LLE--Bun/creat 4/21/4 27/1.21,  continue daily weight, compression hosiery, update CMP

## 2013-02-21 LAB — HEPATIC FUNCTION PANEL
ALT: 14 U/L (ref 7–35)
AST: 20 U/L (ref 13–35)
Alkaline Phosphatase: 56 U/L (ref 25–125)
Bilirubin, Total: 0.5 mg/dL

## 2013-02-21 LAB — BASIC METABOLIC PANEL
Glucose: 100 mg/dL
Potassium: 4.9 mmol/L (ref 3.4–5.3)
Sodium: 137 mmol/L (ref 137–147)

## 2013-03-08 ENCOUNTER — Non-Acute Institutional Stay (SKILLED_NURSING_FACILITY): Payer: Medicare Other | Admitting: Nurse Practitioner

## 2013-03-08 ENCOUNTER — Encounter: Payer: Self-pay | Admitting: Nurse Practitioner

## 2013-03-08 DIAGNOSIS — D509 Iron deficiency anemia, unspecified: Secondary | ICD-10-CM

## 2013-03-08 DIAGNOSIS — N182 Chronic kidney disease, stage 2 (mild): Secondary | ICD-10-CM

## 2013-03-08 DIAGNOSIS — E039 Hypothyroidism, unspecified: Secondary | ICD-10-CM

## 2013-03-08 DIAGNOSIS — IMO0002 Reserved for concepts with insufficient information to code with codable children: Secondary | ICD-10-CM

## 2013-03-08 DIAGNOSIS — F332 Major depressive disorder, recurrent severe without psychotic features: Secondary | ICD-10-CM

## 2013-03-08 DIAGNOSIS — R5381 Other malaise: Secondary | ICD-10-CM

## 2013-03-08 DIAGNOSIS — S22060S Wedge compression fracture of T7-T8 vertebra, sequela: Secondary | ICD-10-CM

## 2013-03-08 DIAGNOSIS — I4891 Unspecified atrial fibrillation: Secondary | ICD-10-CM

## 2013-03-08 DIAGNOSIS — I509 Heart failure, unspecified: Secondary | ICD-10-CM

## 2013-03-08 DIAGNOSIS — S22060A Wedge compression fracture of T7-T8 vertebra, initial encounter for closed fracture: Secondary | ICD-10-CM | POA: Insufficient documentation

## 2013-03-08 NOTE — Assessment & Plan Note (Signed)
X-ray 02/18/13 thoracic spine: possible complete compression of the T7 vertebral body. The patient's pain has been better controlled on Norco 1/2 nightly along with Tylenol 500mg  bid-even if this may contribute to her am sleepiness, my opinion: B>R

## 2013-03-08 NOTE — Assessment & Plan Note (Signed)
Takes Levothyroxine 50mcg, last TSH 3.452 02/21/13         

## 2013-03-08 NOTE — Assessment & Plan Note (Signed)
Several times-difficulty waking up in am--will check CBC and Cath UA C/S. This is likely multifactorial: comorbidity, polypharmacy, A-fib and chronic anticoagulation with Coumadin, ?acute UTI, check CBC, BMP, UA C/S. Observe the patient.

## 2013-03-08 NOTE — Assessment & Plan Note (Signed)
Saw Dr. Alanda Amass 11/02/12 increased to Furosemide to 20mg  and 40mg  alternating dose for increased edema-mainly in her LLE--Bun/creat 4/21/4 27/1.21 and 30/1.86 02/21/13,  continue daily weight, compression hosiery

## 2013-03-08 NOTE — Assessment & Plan Note (Signed)
Managed with Cymbalta 30mg.  

## 2013-03-08 NOTE — Assessment & Plan Note (Signed)
Creatinine was 8.86b7/28/14(was 1.21 11/15/12)--increased Furosemide likely contributes to the problem-update her BMP

## 2013-03-08 NOTE — Assessment & Plan Note (Signed)
Rate controlled, continue anticoagulation with Coumadin . Takes Metoprolol 12.5mg daily.     

## 2013-03-08 NOTE — Progress Notes (Signed)
Patient ID: [name], female   DOB: 06-09-1915, 79 y.o.   MRN: [id number] Code Status: DNR  Allergies  Allergen Reactions  . Amiodarone   . Penicillins   . Amoxicillin   . Celecoxib   . Ciprofloxacin   . Codeine   . Diltiazem   . Lactobacillus (Acidophilus Lactobacillus)   . Metoprolol Succinate     All Beta-blockers  . Metronidazole Hcl   . Other     floricene dye netribudazike    Chief Complaint  Patient presents with  . Medical Managment of Chronic Issues    difficulty to wake up in am x a few times.     HPI: Patient is a 79 y.o. female seen in the SNF at [facility] today for evaluation of difficulty of waking up in am a several times and other chronic medical conditions.  Problem List Items Addressed This Visit   Atrial fibrillation     Rate controlled, continue anticoagulation with Coumadin . Takes Metoprolol 12.5mg  daily.             CKD (chronic kidney disease), stage II     Creatinine was 8.86b7/28/14(was 1.21 11/15/12)--increased Furosemide likely contributes to the problem-update her BMP    Closed wedge compression fracture of T7 vertebra - Primary     X-ray 02/18/13 thoracic spine: possible complete compression of the T7 vertebral body. The patient's pain has been better controlled on Norco 1/2 nightly along with Tylenol 500mg  bid-even if this may contribute to her am sleepiness, my opinion: B>R    Congestive heart failure, unspecified     Saw Dr. Alanda Amass 11/02/12 increased to Furosemide to 20mg  and 40mg  alternating dose for increased edema-mainly in her LLE--Bun/creat 4/21/4 27/1.21 and 30/1.86 02/21/13,  continue daily weight, compression hosiery              Iron deficiency anemia, unspecified     Stable, Hgb 11.4 02/21/13      Major depressive disorder, recurrent episode, severe, without mention of psychotic behavior     Managed with Cymbalta 30mg .    Other malaise and fatigue     Several times-difficulty waking up in  am--will check CBC and Cath UA C/S. This is likely multifactorial: comorbidity, polypharmacy, A-fib and chronic anticoagulation with Coumadin, ?acute UTI, check CBC, BMP, UA C/S. Observe the patient.     Unspecified hypothyroidism     Takes Levothyroxine , last TSH 3.452 02/21/13             Review of Systems:  Review of Systems  Constitutional: Negative for fever, chills, weight loss, malaise/fatigue and diaphoresis.  HENT: Positive for hearing loss. Negative for ear pain, congestion, sore throat, neck pain and ear discharge.   Eyes: Negative for blurred vision, pain, discharge and redness.  Respiratory: Positive for shortness of breath. Negative for cough, sputum production and wheezing.   Cardiovascular: Positive for leg swelling and PND. Negative for chest pain, palpitations, orthopnea and claudication.  Gastrointestinal: Negative for nausea, vomiting, abdominal pain, diarrhea, constipation and blood in stool.  Genitourinary: Positive for frequency. Negative for dysuria, urgency and flank pain.  Musculoskeletal: Positive for joint pain. Negative for back pain.       Pain with decreased ROM of the left shoulder   Skin: Negative for itching and rash.  Neurological: Negative for tremors, sensory change, speech change, focal weakness, seizures, loss of consciousness, weakness and headaches.  Endo/Heme/Allergies: Negative for environmental allergies and polydipsia. Does not bruise/bleed easily.  Psychiatric/Behavioral: Positive for  memory loss. Negative for hallucinations. The patient is not nervous/anxious and does not have insomnia.      Past Medical History  Diagnosis Date  . Anemia   . Arthritis   . CHF (congestive heart failure)   . Depression   . Hypertension   . Chronic kidney disease   . Thyroid disease     Medications: Patient's Medications  New Prescriptions   No medications on file  Previous Medications   ACETAMINOPHEN (TYLENOL) 325 MG TABLET    Take 325  mg by mouth every 6 (six) hours as needed.     BETA CAROTENE W/MINERALS (OCUVITE) TABLET    Take 1 tablet by mouth daily.     CALCIUM-VITAMIN D (OSCAL WITH D) 500-200 MG-UNIT PER TABLET    Take 1 tablet by mouth 2 (two) times daily.     DOCUSATE SODIUM (COLACE) 100 MG CAPSULE    Take 100 mg by mouth 2 (two) times daily.     DULOXETINE (CYMBALTA) 30 MG CAPSULE    Take 30 mg by mouth daily.     FERROUS SULFATE 325 (65 FE) MG TABLET    Take 325 mg by mouth 2 (two) times daily with a meal.    FOLIC ACID (FOLVITE) 1 MG TABLET    Take 1 mg by mouth daily.     FUROSEMIDE (LASIX) 20 MG TABLET    Take 20 mg by mouth daily.     LEVOTHYROXINE (SYNTHROID, LEVOTHROID) 25 MCG TABLET    Take 25 mcg by mouth daily.   MAGNESIUM OXIDE (MAG-OX) 400 MG TABLET    Take 400 mg by mouth daily.     METOPROLOL SUCCINATE (TOPROL-XL) 25 MG 24 HR TABLET    Take 25 mg by mouth daily. Taking 1/2 tab    MULTIPLE VITAMIN (MULTIVITAMIN) TABLET    Take 1 tablet by mouth daily.     POTASSIUM CHLORIDE (MICRO-K) 10 MEQ CR CAPSULE    Take 10 mEq by mouth daily. Takes 20 meq (2) 10 meq daily    SENNOSIDES-DOCUSATE SODIUM (SENOKOT-S) 8.6-50 MG TABLET    Take by mouth daily.     WARFARIN (COUMADIN) 4 MG TABLET    Take 6 mg by mouth daily. Taking 6 mg alt with 5mg   Modified Medications   No medications on file  Discontinued Medications   No medications on file     Physical Exam: Physical Exam  Constitutional: She is oriented to person, place, and time. She appears well-developed and well-nourished. No distress.  HENT:  Head: Normocephalic and atraumatic.  Eyes: Conjunctivae and EOM are normal. Pupils are equal, round, and reactive to light.  Neck: Normal range of motion. Neck supple. No JVD present. No thyromegaly present.  Cardiovascular: Normal rate and normal heart sounds.  Frequent extrasystoles are present.  Pulses:      Dorsalis pedis pulses are 2+ on the right side, and 1+ on the left side.  Pulmonary/Chest: She has no  wheezes. She has no rales. She exhibits no tenderness.  Abdominal: Soft. Bowel sounds are normal.  Musculoskeletal: She exhibits edema (left knee).       Left shoulder: She exhibits decreased range of motion, tenderness, crepitus and pain.       Legs: Anterior distal femur 2x2cm firm nodule palpated, fixed, hard, non tender  Lymphadenopathy:    She has no cervical adenopathy.  Neurological: She is alert and oriented to person, place, and time. She has normal reflexes. No cranial nerve deficit. She exhibits normal  muscle tone. Coordination normal.  Skin: Skin is dry. No rash noted. She is not diaphoretic. There is erythema (left knee).  Psychiatric: She has a normal mood and affect. Her behavior is normal. Judgment and thought content normal. Cognition and memory are impaired. She exhibits abnormal recent memory.    Filed Vitals:   03/08/13 1145  BP: 121/56  Pulse: 68  Temp: 97.5 F (36.4 C)  TempSrc: Tympanic  Resp: 20      Labs reviewed: Basic Metabolic Panel:  Recent Labs  16/10/96 11/15/12 02/21/13  NA  --  141 137  K  --  4.5 4.9  BUN  --  27* 30*  CREATININE  --  1.2* 1.9*  TSH 4.40  --  3.45   Liver Function Tests:  Recent Labs  11/15/12 02/21/13  AST 21 20  ALT 17 14  ALKPHOS 46 56   CBC:  Recent Labs  04/15/12 1026 10/25/12  WBC 11.8* 12.7  HGB 12.6 11.0*  HCT 37.1 32*  MCV 91  --   PLT 120* 116*   Past Procedures: 02/18/13 X-ray T spine: possible complete compression of the T7 vertebral body, spondylosis, scoliosis, kyphosis.    Assessment/Plan Closed wedge compression fracture of T7 vertebra X-ray 02/18/13 thoracic spine: possible complete compression of the T7 vertebral body. The patient's pain has been better controlled on Norco 1/2 nightly along with Tylenol 500mg  bid-even if this may contribute to her am sleepiness, my opinion: B>R  CKD (chronic kidney disease), stage II Creatinine was 8.86b7/28/14(was 1.21 11/15/12)--increased Furosemide  likely contributes to the problem-update her BMP  Other malaise and fatigue Several times-difficulty waking up in am--will check CBC and Cath UA C/S. This is likely multifactorial: comorbidity, polypharmacy, A-fib and chronic anticoagulation with Coumadin, ?acute UTI, check CBC, BMP, UA C/S. Observe the patient.   Atrial fibrillation Rate controlled, continue anticoagulation with Coumadin . Takes Metoprolol 12.5mg  daily.           Congestive heart failure, unspecified Saw Dr. Alanda Amass 11/02/12 increased to Furosemide to 20mg  and 40mg  alternating dose for increased edema-mainly in her LLE--Bun/creat 4/21/4 27/1.21 and 30/1.86 02/21/13,  continue daily weight, compression hosiery            Iron deficiency anemia, unspecified Stable, Hgb 11.4 02/21/13    Major depressive disorder, recurrent episode, severe, without mention of psychotic behavior Managed with Cymbalta 30mg .  Unspecified hypothyroidism Takes Levothyroxine , last TSH 3.452 02/21/13          Family/ Staff Communication: observe.   Goals of Care: SNF  Labs/tests ordered: UA C/S, CBC, BMP

## 2013-03-08 NOTE — Assessment & Plan Note (Signed)
Stable, Hgb 11.4 02/21/13

## 2013-03-10 ENCOUNTER — Encounter: Payer: Self-pay | Admitting: Internal Medicine

## 2013-03-10 LAB — BASIC METABOLIC PANEL
BUN: 29 mg/dL — AB (ref 4–21)
Glucose: 107 mg/dL

## 2013-03-10 LAB — CBC AND DIFFERENTIAL
Platelets: 182 10*3/uL (ref 150–399)
WBC: 25.2 10^3/mL

## 2013-03-14 ENCOUNTER — Telehealth: Payer: Self-pay | Admitting: Hematology & Oncology

## 2013-03-14 LAB — PROTIME-INR: Protime: 24.7 seconds — AB (ref 10.0–13.8)

## 2013-03-14 LAB — POCT INR: INR: 2.3 — AB (ref 0.9–1.1)

## 2013-03-14 NOTE — Telephone Encounter (Signed)
Pt called made 9-17 appointment

## 2013-03-18 NOTE — Progress Notes (Signed)
  Subjective:    Patient ID: [name], female    DOB: 27-Dec-1914, 79 y.o.   MRN: [id number]  HPI    Review of Systems     Objective:   Physical Exam   LAB REVIEW:  02/13/13 UTI    02/21/13 Hgb 11.4, WBC 23,400, Plt 123,000    BMP glucose 110, BUN 30, Creatinine 1.86   03/07/13 INR 2.17     Assessment & Plan:   This encounter was created in error - please disregard.

## 2013-04-01 ENCOUNTER — Non-Acute Institutional Stay (SKILLED_NURSING_FACILITY): Payer: Medicare Other | Admitting: Nurse Practitioner

## 2013-04-01 ENCOUNTER — Encounter: Payer: Self-pay | Admitting: Nurse Practitioner

## 2013-04-01 DIAGNOSIS — I10 Essential (primary) hypertension: Secondary | ICD-10-CM

## 2013-04-01 DIAGNOSIS — D509 Iron deficiency anemia, unspecified: Secondary | ICD-10-CM

## 2013-04-01 DIAGNOSIS — I509 Heart failure, unspecified: Secondary | ICD-10-CM

## 2013-04-01 DIAGNOSIS — E039 Hypothyroidism, unspecified: Secondary | ICD-10-CM

## 2013-04-01 DIAGNOSIS — F332 Major depressive disorder, recurrent severe without psychotic features: Secondary | ICD-10-CM

## 2013-04-01 DIAGNOSIS — I4891 Unspecified atrial fibrillation: Secondary | ICD-10-CM

## 2013-04-01 DIAGNOSIS — D72829 Elevated white blood cell count, unspecified: Secondary | ICD-10-CM | POA: Insufficient documentation

## 2013-04-01 NOTE — Assessment & Plan Note (Signed)
Saw Dr. Alanda Amass 11/02/12 increased to Furosemide to 20mg  and 40mg  alternating dose for increased edema-mainly in her LLE--Bun/creat 4/21/4 27/1.21 and 30/1.86 02/21/13 and 29/1.12 03/10/13,  continue daily weight, compression hosiery. Only trace pedal edema seen.

## 2013-04-01 NOTE — Assessment & Plan Note (Signed)
Hgb 10.8 03/10/13

## 2013-04-01 NOTE — Assessment & Plan Note (Signed)
Managed with Cymbalta 30mg.  

## 2013-04-01 NOTE — Assessment & Plan Note (Signed)
Chronic, wbc 25.2 03/10/13, takes Doxycycline 100mg  bid indefinitely for infected knee.

## 2013-04-01 NOTE — Assessment & Plan Note (Signed)
controlled 

## 2013-04-01 NOTE — Assessment & Plan Note (Signed)
Takes Levothyroxine , last TSH 3.452 02/21/13

## 2013-04-01 NOTE — Assessment & Plan Note (Signed)
Rate controlled, continue anticoagulation with Coumadin . Takes Metoprolol 12.5mg daily.     

## 2013-04-01 NOTE — Progress Notes (Signed)
Patient ID: [name], female   DOB: 10-20-1914, 79 y.o.   MRN: [id number] Code Status: DNR  Allergies  Allergen Reactions  . Amiodarone   . Penicillins   . Amoxicillin   . Celecoxib   . Ciprofloxacin   . Codeine   . Diltiazem   . Lactobacillus [Acidophilus Lactobacillus]   . Metoprolol Succinate     All Beta-blockers  . Metronidazole Hcl   . Other     floricene dye netribudazike    Chief Complaint  Patient presents with  . Medical Managment of Chronic Issues    HPI: Patient is a 79 y.o. female seen in the SNF at [facility] today for evaluation her chronic medical conditions.  Problem List Items Addressed This Visit   Atrial fibrillation     Rate controlled, continue anticoagulation with Coumadin . Takes Metoprolol 12.5mg  daily.               Congestive heart failure, unspecified - Primary     Saw Dr. Alanda Amass 11/02/12 increased to Furosemide to 20mg  and 40mg  alternating dose for increased edema-mainly in her LLE--Bun/creat 4/21/4 27/1.21 and 30/1.86 02/21/13 and 29/1.12 03/10/13,  continue daily weight, compression hosiery. Only trace pedal edema seen.                 Iron deficiency anemia, unspecified     Hgb 10.8 03/10/13      Leukocytosis, unspecified     Chronic, wbc 25.2 03/10/13, takes Doxycycline 100mg  bid indefinitely for infected knee.     Major depressive disorder, recurrent episode, severe, without mention of psychotic behavior     Managed with Cymbalta 30mg .      Unspecified essential hypertension (Chronic)     controlled        Unspecified hypothyroidism     Takes Levothyroxine , last TSH 3.452 02/21/13               Review of Systems:  Review of Systems  Constitutional: Negative for fever, chills, weight loss, malaise/fatigue and diaphoresis.  HENT: Positive for hearing loss. Negative for ear pain, congestion, sore throat, neck pain and ear discharge.   Eyes: Negative for blurred vision, pain,  discharge and redness.  Respiratory: Positive for shortness of breath. Negative for cough, sputum production and wheezing.   Cardiovascular: Positive for leg swelling and PND. Negative for chest pain, palpitations, orthopnea and claudication.  Gastrointestinal: Negative for nausea, vomiting, abdominal pain, diarrhea, constipation and blood in stool.  Genitourinary: Positive for frequency. Negative for dysuria, urgency and flank pain.  Musculoskeletal: Positive for joint pain. Negative for back pain.       Pain with decreased ROM of the left shoulder   Skin: Negative for itching and rash.  Neurological: Negative for tremors, sensory change, speech change, focal weakness, seizures, loss of consciousness, weakness and headaches.  Endo/Heme/Allergies: Negative for environmental allergies and polydipsia. Does not bruise/bleed easily.  Psychiatric/Behavioral: Positive for memory loss. Negative for hallucinations. The patient is not nervous/anxious and does not have insomnia.      Past Medical History  Diagnosis Date  . Anemia   . Arthritis   . CHF (congestive heart failure)   . Depression   . Hypertension   . Chronic kidney disease   . Thyroid disease     Medications: Patient's Medications  New Prescriptions   No medications on file  Previous Medications   ACETAMINOPHEN (TYLENOL) 325 MG TABLET    Take 325 mg by mouth every 6 (six)  hours as needed.     BETA CAROTENE W/MINERALS (OCUVITE) TABLET    Take 1 tablet by mouth daily.     CALCIUM-VITAMIN D (OSCAL WITH D) 500-200 MG-UNIT PER TABLET    Take 1 tablet by mouth 2 (two) times daily.     DOCUSATE SODIUM (COLACE) 100 MG CAPSULE    Take 100 mg by mouth 2 (two) times daily.     DULOXETINE (CYMBALTA) 30 MG CAPSULE    Take 30 mg by mouth daily.     FERROUS SULFATE 325 (65 FE) MG TABLET    Take 325 mg by mouth 2 (two) times daily with a meal.    FOLIC ACID (FOLVITE) 1 MG TABLET    Take 1 mg by mouth daily.     FUROSEMIDE (LASIX) 20 MG TABLET     Take 20 mg by mouth daily.     LEVOTHYROXINE (SYNTHROID, LEVOTHROID) 25 MCG TABLET    Take 25 mcg by mouth daily.   MAGNESIUM OXIDE (MAG-OX) 400 MG TABLET    Take 400 mg by mouth daily.     METOPROLOL SUCCINATE (TOPROL-XL) 25 MG 24 HR TABLET    Take 25 mg by mouth daily. Taking 1/2 tab    MULTIPLE VITAMIN (MULTIVITAMIN) TABLET    Take 1 tablet by mouth daily.     POTASSIUM CHLORIDE (MICRO-K) 10 MEQ CR CAPSULE    Take 10 mEq by mouth daily. Takes 20 meq (2) 10 meq daily    SENNOSIDES-DOCUSATE SODIUM (SENOKOT-S) 8.6-50 MG TABLET    Take by mouth daily.     WARFARIN (COUMADIN) 4 MG TABLET    Take 6 mg by mouth daily. Taking 6 mg alt with 5mg   Modified Medications   No medications on file  Discontinued Medications   No medications on file     Physical Exam: Physical Exam  Constitutional: She is oriented to person, place, and time. She appears well-developed and well-nourished. No distress.  HENT:  Head: Normocephalic and atraumatic.  Eyes: Conjunctivae and EOM are normal. Pupils are equal, round, and reactive to light.  Neck: Normal range of motion. Neck supple. No JVD present. No thyromegaly present.  Cardiovascular: Normal rate and normal heart sounds.  Frequent extrasystoles are present.  Pulses:      Dorsalis pedis pulses are 2+ on the right side, and 1+ on the left side.  Pulmonary/Chest: She has no wheezes. She has no rales. She exhibits no tenderness.  Abdominal: Soft. Bowel sounds are normal.  Musculoskeletal: She exhibits edema (left knee).       Left shoulder: She exhibits decreased range of motion, tenderness, crepitus and pain.       Legs: Anterior distal femur 2x2cm firm nodule palpated, fixed, hard, non tender  Lymphadenopathy:    She has no cervical adenopathy.  Neurological: She is alert and oriented to person, place, and time. She has normal reflexes. No cranial nerve deficit. She exhibits normal muscle tone. Coordination normal.  Skin: Skin is dry. No rash noted.  She is not diaphoretic. There is erythema (left knee).  Psychiatric: She has a normal mood and affect. Her behavior is normal. Judgment and thought content normal. Cognition and memory are impaired. She exhibits abnormal recent memory.    Filed Vitals:   04/01/13 1643  BP: 150/69  Pulse: 69  Temp: 97.1 F (36.2 C)  TempSrc: Tympanic  Resp: 18      Labs reviewed: Basic Metabolic Panel:  Recent Labs  16/10/96 11/15/12 02/21/13 03/10/13  NA  --  141 137 139  K  --  4.5 4.9 4.6  BUN  --  27* 30* 29*  CREATININE  --  1.2* 1.9* 1.1  TSH 4.40  --  3.45  --    Liver Function Tests:  Recent Labs  11/15/12 02/21/13  AST 21 20  ALT 17 14  ALKPHOS 46 56   CBC:  Recent Labs  04/15/12 1026 10/25/12 03/10/13  WBC 11.8* 12.7 25.2  HGB 12.6 11.0* 10.8*  HCT 37.1 32* 32*  MCV 91  --   --   PLT 120* 116* 182   Past Procedures: 02/18/13 X-ray T spine: possible complete compression of the T7 vertebral body, spondylosis, scoliosis, kyphosis.    Assessment/Plan Congestive heart failure, unspecified Saw Dr. Alanda Amass 11/02/12 increased to Furosemide to 20mg  and 40mg  alternating dose for increased edema-mainly in her LLE--Bun/creat 4/21/4 27/1.21 and 30/1.86 02/21/13 and 29/1.12 03/10/13,  continue daily weight, compression hosiery. Only trace pedal edema seen.               Unspecified hypothyroidism Takes Levothyroxine , last TSH 3.452 02/21/13          Unspecified essential hypertension controlled      Pain in joint, shoulder region Continue with ROM as tolerated. Left shoulder chronic pain with decreased ROM. Norco was reduced to 1/2 qhs along with Tylenol bid per the patient's request.         Major depressive disorder, recurrent episode, severe, without mention of psychotic behavior Managed with Cymbalta 30mg .    Atrial fibrillation Rate controlled, continue anticoagulation with Coumadin . Takes Metoprolol 12.5mg  daily.              Iron deficiency anemia, unspecified Hgb 10.8 03/10/13    Leukocytosis, unspecified Chronic, wbc 25.2 03/10/13, takes Doxycycline 100mg  bid indefinitely for infected knee.     Family/ Staff Communication: observe.   Goals of Care: SNF  Labs/tests ordered: none

## 2013-04-01 NOTE — Assessment & Plan Note (Signed)
Continue with ROM as tolerated. Left shoulder chronic pain with decreased ROM. Norco was reduced to 1/2 qhs along with Tylenol bid per the patient's request.

## 2013-04-13 ENCOUNTER — Other Ambulatory Visit (HOSPITAL_BASED_OUTPATIENT_CLINIC_OR_DEPARTMENT_OTHER): Payer: Medicare Other | Admitting: Lab

## 2013-04-13 ENCOUNTER — Ambulatory Visit (HOSPITAL_BASED_OUTPATIENT_CLINIC_OR_DEPARTMENT_OTHER): Payer: Medicare Other | Admitting: Hematology & Oncology

## 2013-04-13 VITALS — BP 124/40 | HR 61 | Temp 97.7°F | Resp 14 | Ht 66.0 in | Wt 194.0 lb

## 2013-04-13 DIAGNOSIS — N289 Disorder of kidney and ureter, unspecified: Secondary | ICD-10-CM

## 2013-04-13 DIAGNOSIS — D649 Anemia, unspecified: Secondary | ICD-10-CM

## 2013-04-13 DIAGNOSIS — D509 Iron deficiency anemia, unspecified: Secondary | ICD-10-CM

## 2013-04-13 DIAGNOSIS — D631 Anemia in chronic kidney disease: Secondary | ICD-10-CM

## 2013-04-13 LAB — CBC WITH DIFFERENTIAL (CANCER CENTER ONLY)
HCT: 38.5 % (ref 34.8–46.6)
HGB: 12.3 g/dL (ref 11.6–15.9)
MCHC: 31.9 g/dL — ABNORMAL LOW (ref 32.0–36.0)
MCV: 95 fL (ref 81–101)
RDW: 15.3 % (ref 11.1–15.7)

## 2013-04-13 LAB — MANUAL DIFFERENTIAL (CHCC SATELLITE)
ANC (CHCC HP manual diff): 3.9 10*3/uL (ref 1.5–6.7)
Blasts: 36 % — ABNORMAL HIGH (ref 0–0)
PLT EST ~~LOC~~: DECREASED

## 2013-04-13 LAB — IRON AND TIBC CHCC
%SAT: 23 % (ref 21–57)
Iron: 48 ug/dL (ref 41–142)
TIBC: 209 ug/dL — ABNORMAL LOW (ref 236–444)
UIBC: 161 ug/dL (ref 120–384)

## 2013-04-13 LAB — FERRITIN CHCC: Ferritin: 223 ng/ml (ref 9–269)

## 2013-04-13 NOTE — Progress Notes (Signed)
This office note has been dictated.

## 2013-04-18 ENCOUNTER — Telehealth: Payer: Self-pay | Admitting: *Deleted

## 2013-04-18 NOTE — Telephone Encounter (Signed)
Called patient daughter to let her know that her mothers labs are good per dr. Myna Hidalgo

## 2013-04-18 NOTE — Telephone Encounter (Signed)
Message copied by Anselm Jungling on Mon Apr 18, 2013  2:32 PM ------      Message from: Arlan Organ R      Created: Thu Apr 14, 2013  8:24 AM       Call her dgtr-  Iron level is stilll ok!!  Cindee Lame ------

## 2013-04-26 NOTE — Progress Notes (Signed)
CC:   [name], M.D.  DIAGNOSES: 1. Anemia of renal insufficiency. 2. Intermittent iron-deficiency anemia. 3. 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  CURRENT THERAPY:  IV iron as indicated.  INTERIM HISTORY:  [name] comes in for followup.  I last saw her back in June of 2013.  Since then, she has been doing okay.  Apparently, she had been having some lab work done at the assisted living facility. They found that she has been a little bit more anemic.  When we last saw her back in June of last year, her white cell count was 10.6, hemoglobin 12.7, hematocrit 38.1, platelet count was 113,000. Back in August of this year, her white count was up to 25.2.  Hemoglobin was 10.8.  Platelet count was 182.  She has had no problems with bleeding.  Her appetite has been okay.  She has had no leg swelling.  Her left knee, which was operated on a couple years ago, has been doing okay.  PHYSICAL EXAMINATION:  General:  This is an elderly white female who comes in a wheelchair.  She is in no distress.  She is very alert. Vital Signs:  Show a temperature of 97.7, pulse 59, respiratory rate 14, blood pressure 124/40.  Weight is 194.  Head and Neck:  Shows a normocephalic, atraumatic skull.  There are no ocular or oral lesions. There are no palpable cervical or supraclavicular lymph nodes.  Lungs: Clear bilaterally.  There are no rales, wheeze, or rhonchi.  Cardiac: Regular rate and rhythm with a normal S1, S2.  She has an occasional extra beat.  She has a 1/6 systolic ejection murmur.  Abdomen:  Soft. She has good bowel sounds.  There is no fluid wave.  There is no palpable hepatosplenomegaly.  Extremities:  Show some chronic mild swelling of the right knee.  She has no edema in her lower legs.  Skin: No rashes.  Neurological.  No focal neurological deficits.  LABORATORY STUDIES:  White cell count is 21.6, hemoglobin 12.3, hematocrit 38.5, platelet count 115.  On her peripheral  blood smear, she does have an increase in monocytes. She has no nucleated red blood cells.  I do not see any obvious blasts. Her platelets are slightly decreased in number.  There is no rouleaux formation.  IMPRESSION:  [name] is a 79 year old white female with multifactorial hematologic issues.  Her white cell count is trending upward.  She does have an increase in monocytes in her smear.  As such, I suspect that she may have chronic myelomonocytic leukemia.  However, this is not going to be an issue for her.  She is 79 years old.  She is in good health right now.  What I watch for with her is her anemia.  Her hemoglobin is fine today.  I want to see her back in another 3 months.  I reviewed her lab work with her daughter.  I reviewed what I saw on her blood smear with her daughter.    ______________________________ Josph Macho, M.D. PRE/MEDQ  D:  04/13/2013  T:  04/26/2013  Job:  [id number]

## 2013-05-03 ENCOUNTER — Encounter: Payer: Self-pay | Admitting: Nurse Practitioner

## 2013-05-03 ENCOUNTER — Non-Acute Institutional Stay (SKILLED_NURSING_FACILITY): Payer: Medicare Other | Admitting: Nurse Practitioner

## 2013-05-03 DIAGNOSIS — D509 Iron deficiency anemia, unspecified: Secondary | ICD-10-CM

## 2013-05-03 DIAGNOSIS — N182 Chronic kidney disease, stage 2 (mild): Secondary | ICD-10-CM

## 2013-05-03 DIAGNOSIS — M1710 Unilateral primary osteoarthritis, unspecified knee: Secondary | ICD-10-CM

## 2013-05-03 DIAGNOSIS — I10 Essential (primary) hypertension: Secondary | ICD-10-CM

## 2013-05-03 DIAGNOSIS — E039 Hypothyroidism, unspecified: Secondary | ICD-10-CM

## 2013-05-03 DIAGNOSIS — M1711 Unilateral primary osteoarthritis, right knee: Secondary | ICD-10-CM

## 2013-05-03 DIAGNOSIS — M25532 Pain in left wrist: Secondary | ICD-10-CM | POA: Insufficient documentation

## 2013-05-03 DIAGNOSIS — F332 Major depressive disorder, recurrent severe without psychotic features: Secondary | ICD-10-CM

## 2013-05-03 DIAGNOSIS — I4891 Unspecified atrial fibrillation: Secondary | ICD-10-CM

## 2013-05-03 DIAGNOSIS — M25539 Pain in unspecified wrist: Secondary | ICD-10-CM

## 2013-05-03 DIAGNOSIS — I509 Heart failure, unspecified: Secondary | ICD-10-CM

## 2013-05-03 DIAGNOSIS — M25519 Pain in unspecified shoulder: Secondary | ICD-10-CM

## 2013-05-03 NOTE — Assessment & Plan Note (Signed)
Managed with Cymbalta 30mg.  

## 2013-05-03 NOTE — Progress Notes (Signed)
Patient ID: [name], female   DOB: 06-14-1915, 79 y.o.   MRN: [id number]  Code Status: DNR  Allergies  Allergen Reactions  . Amiodarone   . Penicillins   . Amoxicillin   . Celecoxib   . Ciprofloxacin   . Codeine   . Diltiazem   . Lactobacillus [Acidophilus Lactobacillus]   . Metoprolol Succinate     All Beta-blockers  . Metronidazole Hcl   . Other     floricene dye netribudazike    Chief Complaint  Patient presents with  . Medical Managment of Chronic Issues    left wrist pain.     HPI: Patient is a 79 y.o. female seen in the SNF at [facility] today for evaluation the left wrist pain and her chronic medical conditions.  Problem List Items Addressed This Visit   Atrial fibrillation - Primary     Rate controlled, continue anticoagulation with Coumadin . Takes Metoprolol 12.5mg  daily.                 CKD (chronic kidney disease), stage II     Creatinine was 1.12 03/10/13--increased Furosemide likely contributes to the problem-      Congestive heart failure, unspecified     Saw Dr. Alanda Amass 11/02/12 increased to Furosemide to 20mg  and 40mg  alternating dose for increased edema-mainly in her LLE--Bun/creat 4/21/4 27/1.21 and 30/1.86 02/21/13 and 29/1.12 03/10/13,  continue daily weight, compression hosiery. Only trace pedal edema seen.                   Iron deficiency anemia, unspecified     Hgb 10.8 03/10/13, takes Folic acid.         Left wrist pain     No erythema, fever, swelling, or decreased ROM--the patient likes to have VICKs cream topically.     Major depressive disorder, recurrent episode, severe, without mention of psychotic behavior     Managed with Cymbalta 30mg .      Osteoarthritis of right knee     Infected knee, s/p joint fusion, chronic ABT with Doxycycline 100mg  bid indefinitely.     Pain in joint, shoulder region (Chronic)     No c/o pain with ROM today. Decreased overhead ROM of the R+L shoulders.     Unspecified essential hypertension (Chronic)     controlled          Unspecified hypothyroidism     Takes Levothyroxine , last TSH 3.452 02/21/13              Relevant Medications      levothyroxine (SYNTHROID, LEVOTHROID) 50 MCG tablet      Review of Systems:  Review of Systems  Constitutional: Negative for fever, chills, weight loss, malaise/fatigue and diaphoresis.  HENT: Positive for hearing loss. Negative for ear pain, congestion, sore throat, neck pain and ear discharge.   Eyes: Negative for blurred vision, pain, discharge and redness.  Respiratory: Positive for shortness of breath. Negative for cough, sputum production and wheezing.   Cardiovascular: Positive for leg swelling and PND. Negative for chest pain, palpitations, orthopnea and claudication.  Gastrointestinal: Negative for nausea, vomiting, abdominal pain, diarrhea, constipation and blood in stool.  Genitourinary: Positive for frequency. Negative for dysuria, urgency and flank pain.  Musculoskeletal: Positive for joint pain. Negative for back pain.       Decreased ROM of the R+L shoulders for over head movement. C/o the left wrist pain  Skin: Negative for itching and rash.  Neurological: Negative for  tremors, sensory change, speech change, focal weakness, seizures, loss of consciousness, weakness and headaches.  Endo/Heme/Allergies: Negative for environmental allergies and polydipsia. Does not bruise/bleed easily.  Psychiatric/Behavioral: Positive for memory loss. Negative for hallucinations. The patient is not nervous/anxious and does not have insomnia.      Past Medical History  Diagnosis Date  . Anemia   . Arthritis   . CHF (congestive heart failure)   . Depression   . Hypertension   . Chronic kidney disease   . Thyroid disease     Medications: Patient's Medications  New Prescriptions   No medications on file  Previous Medications   ACETAMINOPHEN (TYLENOL) 325 MG TABLET    Take 325  mg by mouth every 6 (six) hours as needed.     BETA CAROTENE W/MINERALS (OCUVITE) TABLET    Take 1 tablet by mouth daily.     CALCIUM-VITAMIN D (OSCAL WITH D) 500-200 MG-UNIT PER TABLET    Take 1 tablet by mouth 2 (two) times daily.     DOCUSATE SODIUM (COLACE) 100 MG CAPSULE    Take 100 mg by mouth 2 (two) times daily.     DOXYCYCLINE (VIBRAMYCIN) 100 MG CAPSULE    Take 100 mg by mouth 2 (two) times daily.   DULOXETINE (CYMBALTA) 30 MG CAPSULE    Take 30 mg by mouth daily.     FERROUS SULFATE 325 (65 FE) MG TABLET    Take 325 mg by mouth 2 (two) times daily with a meal.    FOLIC ACID (FOLVITE) 1 MG TABLET    Take 1 mg by mouth daily.     FUROSEMIDE (LASIX) 20 MG TABLET    Take 20 mg by mouth daily. ONE DAY 20 NEXT DAY 40 AND ALTERNATE.   LEVOTHYROXINE (SYNTHROID, LEVOTHROID) 50 MCG TABLET    Take 50 mcg by mouth daily before breakfast.   MAGNESIUM OXIDE (MAG-OX) 400 MG TABLET    Take 400 mg by mouth daily.     METOPROLOL SUCCINATE (TOPROL-XL) 25 MG 24 HR TABLET    Take 25 mg by mouth daily. Taking 1/2 tab    MULTIPLE VITAMIN (MULTIVITAMIN) TABLET    Take 1 tablet by mouth daily.     POTASSIUM CHLORIDE (MICRO-K) 10 MEQ CR CAPSULE    Take 10 mEq by mouth daily. Takes 20 meq (2) 10 meq daily    SENNOSIDES-DOCUSATE SODIUM (SENOKOT-S) 8.6-50 MG TABLET    Take by mouth daily.     WARFARIN (COUMADIN) 4 MG TABLET    Take 6 mg by mouth daily. Taking 6 mg alt with 5mg   Modified Medications   No medications on file  Discontinued Medications   LEVOTHYROXINE (SYNTHROID, LEVOTHROID) 25 MCG TABLET    Take 25 mcg by mouth daily. INCREASED TO 50 MCG.     Physical Exam: Physical Exam  Constitutional: She is oriented to person, place, and time. She appears well-developed and well-nourished. No distress.  HENT:  Head: Normocephalic and atraumatic.  Eyes: Conjunctivae and EOM are normal. Pupils are equal, round, and reactive to light.  Neck: Normal range of motion. Neck supple. No JVD present. No  thyromegaly present.  Cardiovascular: Normal rate and normal heart sounds.  Frequent extrasystoles are present.  Pulses:      Dorsalis pedis pulses are 2+ on the right side, and 1+ on the left side.  Pulmonary/Chest: She has no wheezes. She has no rales. She exhibits no tenderness.  Abdominal: Soft. Bowel sounds are normal.  Musculoskeletal: She exhibits  edema (left knee).       Left shoulder: She exhibits decreased range of motion, tenderness, crepitus and pain.       Legs: Anterior distal femur 2x2cm firm nodule palpated, fixed, hard, non tender. Reduced ROM of the R+L shoulder for over head activities. C/o the left wrist pain--no swelling, erythema, decreased ROM  Lymphadenopathy:    She has no cervical adenopathy.  Neurological: She is alert and oriented to person, place, and time. She has normal reflexes. No cranial nerve deficit. She exhibits normal muscle tone. Coordination normal.  Skin: Skin is dry. No rash noted. She is not diaphoretic. No erythema (left knee).  Psychiatric: She has a normal mood and affect. Her behavior is normal. Judgment and thought content normal. Cognition and memory are impaired. She exhibits abnormal recent memory.    Filed Vitals:   05/03/13 1429  BP: 114/61  Pulse: 62  Temp: 98.3 F (36.8 C)  TempSrc: Tympanic  Resp: 18      Labs reviewed: Basic Metabolic Panel:  Recent Labs  40/98/11 11/15/12 02/21/13 03/10/13  NA  --  141 137 139  K  --  4.5 4.9 4.6  BUN  --  27* 30* 29*  CREATININE  --  1.2* 1.9* 1.1  TSH 4.40  --  3.45  --    Liver Function Tests:  Recent Labs  11/15/12 02/21/13  AST 21 20  ALT 17 14  ALKPHOS 46 56   CBC:  Recent Labs  10/25/12 03/10/13 04/13/13 1150  WBC 12.7 25.2 21.6*  HGB 11.0* 10.8* 12.3  HCT 32* 32* 38.5  MCV  --   --  95  PLT 116* 182 115*   Past Procedures: 02/18/13 X-ray T spine: possible complete compression of the T7 vertebral body, spondylosis, scoliosis, kyphosis.     Assessment/Plan Atrial fibrillation Rate controlled, continue anticoagulation with Coumadin . Takes Metoprolol 12.5mg  daily.               CKD (chronic kidney disease), stage II Creatinine was 1.12 03/10/13--increased Furosemide likely contributes to the problem-    Congestive heart failure, unspecified Saw Dr. Alanda Amass 11/02/12 increased to Furosemide to 20mg  and 40mg  alternating dose for increased edema-mainly in her LLE--Bun/creat 4/21/4 27/1.21 and 30/1.86 02/21/13 and 29/1.12 03/10/13,  continue daily weight, compression hosiery. Only trace pedal edema seen.                 Iron deficiency anemia, unspecified Hgb 10.8 03/10/13, takes Folic acid.       Major depressive disorder, recurrent episode, severe, without mention of psychotic behavior Managed with Cymbalta 30mg .    Osteoarthritis of right knee Infected knee, s/p joint fusion, chronic ABT with Doxycycline 100mg  bid indefinitely.   Unspecified essential hypertension controlled        Unspecified hypothyroidism Takes Levothyroxine , last TSH 3.452 02/21/13            Pain in joint, shoulder region No c/o pain with ROM today. Decreased overhead ROM of the R+L shoulders.   Left wrist pain No erythema, fever, swelling, or decreased ROM--the patient likes to have VICKs cream topically.     Family/ Staff Communication: observe.   Goals of Care: SNF  Labs/tests ordered: none

## 2013-05-03 NOTE — Assessment & Plan Note (Signed)
Creatinine was 1.12 03/10/13--increased Furosemide likely contributes to the problem-     

## 2013-05-03 NOTE — Assessment & Plan Note (Signed)
Takes Levothyroxine 50mcg, last TSH 3.452 02/21/13         

## 2013-05-03 NOTE — Assessment & Plan Note (Signed)
No c/o pain with ROM today. Decreased overhead ROM of the R+L shoulders.

## 2013-05-03 NOTE — Assessment & Plan Note (Signed)
Rate controlled, continue anticoagulation with Coumadin . Takes Metoprolol 12.5mg daily.     

## 2013-05-03 NOTE — Assessment & Plan Note (Signed)
No erythema, fever, swelling, or decreased ROM--the patient likes to have VICKs cream topically.

## 2013-05-03 NOTE — Assessment & Plan Note (Signed)
Infected knee, s/p joint fusion, chronic ABT with Doxycycline 100mg bid indefinitely.    

## 2013-05-03 NOTE — Assessment & Plan Note (Signed)
Saw Dr. Alanda Amass 11/02/12 increased to Furosemide to 20mg  and 40mg  alternating dose for increased edema-mainly in her LLE--Bun/creat 4/21/4 27/1.21 and 30/1.86 02/21/13 and 29/1.12 03/10/13,  continue daily weight, compression hosiery. Only trace pedal edema seen.

## 2013-05-03 NOTE — Assessment & Plan Note (Signed)
Hgb 10.8 03/10/13, takes Folic acid.

## 2013-05-03 NOTE — Assessment & Plan Note (Signed)
controlled 

## 2013-05-12 LAB — POCT INR: INR: 2.4 — AB (ref 0.9–1.1)

## 2013-05-31 ENCOUNTER — Non-Acute Institutional Stay (SKILLED_NURSING_FACILITY): Payer: Medicare Other | Admitting: Nurse Practitioner

## 2013-05-31 ENCOUNTER — Encounter: Payer: Self-pay | Admitting: Nurse Practitioner

## 2013-05-31 DIAGNOSIS — M25532 Pain in left wrist: Secondary | ICD-10-CM

## 2013-05-31 DIAGNOSIS — I4891 Unspecified atrial fibrillation: Secondary | ICD-10-CM

## 2013-05-31 DIAGNOSIS — K5900 Constipation, unspecified: Secondary | ICD-10-CM

## 2013-05-31 DIAGNOSIS — N182 Chronic kidney disease, stage 2 (mild): Secondary | ICD-10-CM

## 2013-05-31 DIAGNOSIS — M1710 Unilateral primary osteoarthritis, unspecified knee: Secondary | ICD-10-CM

## 2013-05-31 DIAGNOSIS — E039 Hypothyroidism, unspecified: Secondary | ICD-10-CM

## 2013-05-31 DIAGNOSIS — M1711 Unilateral primary osteoarthritis, right knee: Secondary | ICD-10-CM

## 2013-05-31 DIAGNOSIS — M25539 Pain in unspecified wrist: Secondary | ICD-10-CM

## 2013-05-31 NOTE — Assessment & Plan Note (Signed)
Infected knee, s/p joint fusion, chronic ABT with Doxycycline 100mg  bid indefinitely.

## 2013-05-31 NOTE — Progress Notes (Signed)
Patient ID: [name], female   DOB: 12/21/1914, 79 y.o.   MRN: [id number]  Code Status: DNR  Allergies  Allergen Reactions  . Amiodarone   . Penicillins   . Amoxicillin   . Celecoxib   . Ciprofloxacin   . Codeine   . Diltiazem   . Lactobacillus [Acidophilus Lactobacillus]   . Metoprolol Succinate     All Beta-blockers  . Metronidazole Hcl   . Other     floricene dye netribudazike    Chief Complaint  Patient presents with  . Medical Managment of Chronic Issues    HPI: Patient is a 79 y.o. female seen in the SNF at [facility] today for evaluation of her chronic medical conditions.  Problem List Items Addressed This Visit   Atrial fibrillation - Primary     Rate controlled, continue anticoagulation with Coumadin . Takes Metoprolol 12.5mg  daily.                   CKD (chronic kidney disease), stage II     Creatinine was 1.12 03/10/13--increased Furosemide likely contributes to the problem-        Left wrist pain     No erythema, fever, swelling, or decreased ROM--the patient likes to have VICKs cream topically. Also she takes Norco 1/2 tab qpm for pain in her knee, shoulder, and wrist.       Osteoarthritis of right knee     Infected knee, s/p joint fusion, chronic ABT with Doxycycline 100mg  bid indefinitely.       Unspecified constipation     Takes Colcace bid--no problem.     Unspecified hypothyroidism     Takes Levothyroxine , last TSH 3.452 02/21/13                   Review of Systems:  Review of Systems  Constitutional: Negative for fever, chills, weight loss, malaise/fatigue and diaphoresis.  HENT: Positive for hearing loss. Negative for congestion, ear discharge, ear pain and sore throat.   Eyes: Negative for blurred vision, pain, discharge and redness.  Respiratory: Positive for shortness of breath. Negative for cough, sputum production and wheezing.   Cardiovascular: Positive for leg swelling and PND.  Negative for chest pain, palpitations, orthopnea and claudication.  Gastrointestinal: Negative for nausea, vomiting, abdominal pain, diarrhea, constipation and blood in stool.  Genitourinary: Positive for frequency. Negative for dysuria, urgency and flank pain.  Musculoskeletal: Positive for joint pain. Negative for back pain and neck pain.       Decreased ROM of the R+L shoulders for over head movement. C/o the left wrist pain  Skin: Negative for itching and rash.  Neurological: Negative for tremors, sensory change, speech change, focal weakness, seizures, loss of consciousness, weakness and headaches.  Endo/Heme/Allergies: Negative for environmental allergies and polydipsia. Does not bruise/bleed easily.  Psychiatric/Behavioral: Positive for memory loss. Negative for hallucinations. The patient is not nervous/anxious and does not have insomnia.      Past Medical History  Diagnosis Date  . Anemia   . Arthritis   . CHF (congestive heart failure)   . Depression   . Hypertension   . Chronic kidney disease   . Thyroid disease     Medications: Patient's Medications  New Prescriptions   No medications on file  Previous Medications   ACETAMINOPHEN (TYLENOL) 325 MG TABLET    Take 325 mg by mouth every 6 (six) hours as needed.     BETA CAROTENE W/MINERALS (OCUVITE) TABLET  Take 1 tablet by mouth daily.     CALCIUM-VITAMIN D (OSCAL WITH D) 500-200 MG-UNIT PER TABLET    Take 1 tablet by mouth 2 (two) times daily.     DOCUSATE SODIUM (COLACE) 100 MG CAPSULE    Take 100 mg by mouth 2 (two) times daily.     DOXYCYCLINE (VIBRAMYCIN) 100 MG CAPSULE    Take 100 mg by mouth 2 (two) times daily.   DULOXETINE (CYMBALTA) 30 MG CAPSULE    Take 30 mg by mouth daily.     FERROUS SULFATE 325 (65 FE) MG TABLET    Take 325 mg by mouth 2 (two) times daily with a meal.    FOLIC ACID (FOLVITE) 1 MG TABLET    Take 1 mg by mouth daily.     FUROSEMIDE (LASIX) 20 MG TABLET    Take 20 mg by mouth daily. ONE DAY  20 NEXT DAY 40 AND ALTERNATE.   LEVOTHYROXINE (SYNTHROID, LEVOTHROID) 50 MCG TABLET    Take 50 mcg by mouth daily before breakfast.   MAGNESIUM OXIDE (MAG-OX) 400 MG TABLET    Take 400 mg by mouth daily.     METOPROLOL SUCCINATE (TOPROL-XL) 25 MG 24 HR TABLET    Take 25 mg by mouth daily. Taking 1/2 tab    MULTIPLE VITAMIN (MULTIVITAMIN) TABLET    Take 1 tablet by mouth daily.     POTASSIUM CHLORIDE (MICRO-K) 10 MEQ CR CAPSULE    Take 10 mEq by mouth daily. Takes 20 meq (2) 10 meq daily    SENNOSIDES-DOCUSATE SODIUM (SENOKOT-S) 8.6-50 MG TABLET    Take by mouth daily.     WARFARIN (COUMADIN) 4 MG TABLET    Take 6 mg by mouth daily. Taking 6 mg alt with 5mg   Modified Medications   No medications on file  Discontinued Medications   No medications on file     Physical Exam: Physical Exam  Constitutional: She is oriented to person, place, and time. She appears well-developed and well-nourished. No distress.  HENT:  Head: Normocephalic and atraumatic.  Eyes: Conjunctivae and EOM are normal. Pupils are equal, round, and reactive to light.  Neck: Normal range of motion. Neck supple. No JVD present. No thyromegaly present.  Cardiovascular: Normal rate and normal heart sounds.  Frequent extrasystoles are present.  Pulses:      Dorsalis pedis pulses are 2+ on the right side, and 1+ on the left side.  Pulmonary/Chest: She has no wheezes. She has no rales. She exhibits no tenderness.  Abdominal: Soft. Bowel sounds are normal.  Musculoskeletal: She exhibits edema (left knee).       Left shoulder: She exhibits decreased range of motion, tenderness, crepitus and pain.       Legs: Anterior distal femur 2x2cm firm nodule palpated, fixed, hard, non tender. Reduced ROM of the R+L shoulder for over head activities. C/o the left wrist pain--no swelling, erythema, decreased ROM  Lymphadenopathy:    She has no cervical adenopathy.  Neurological: She is alert and oriented to person, place, and time. She has  normal reflexes. No cranial nerve deficit. She exhibits normal muscle tone. Coordination normal.  Skin: Skin is dry. No rash noted. She is not diaphoretic. No erythema (left knee).  Psychiatric: She has a normal mood and affect. Her behavior is normal. Judgment and thought content normal. Cognition and memory are impaired. She exhibits abnormal recent memory.    Filed Vitals:   05/31/13 1516  BP: 110/52  Pulse: 64  Temp:  98.2 F (36.8 C)  TempSrc: Tympanic  Resp: 20      Labs reviewed: Basic Metabolic Panel:  Recent Labs  29/56/21 11/15/12 02/21/13 03/10/13  NA  --  141 137 139  K  --  4.5 4.9 4.6  BUN  --  27* 30* 29*  CREATININE  --  1.2* 1.9* 1.1  TSH 4.40  --  3.45  --    Liver Function Tests:  Recent Labs  11/15/12 02/21/13  AST 21 20  ALT 17 14  ALKPHOS 46 56   CBC:  Recent Labs  10/25/12 03/10/13 04/13/13 1150  WBC 12.7 25.2 21.6*  HGB 11.0* 10.8* 12.3  HCT 32* 32* 38.5  MCV  --   --  95  PLT 116* 182 115*   Past Procedures: 02/18/13 X-ray T spine: possible complete compression of the T7 vertebral body, spondylosis, scoliosis, kyphosis.    Assessment/Plan Atrial fibrillation Rate controlled, continue anticoagulation with Coumadin . Takes Metoprolol 12.5mg  daily.                 CKD (chronic kidney disease), stage II Creatinine was 1.12 03/10/13--increased Furosemide likely contributes to the problem-      Unspecified hypothyroidism Takes Levothyroxine , last TSH 3.452 02/21/13              Osteoarthritis of right knee Infected knee, s/p joint fusion, chronic ABT with Doxycycline 100mg  bid indefinitely.     Left wrist pain No erythema, fever, swelling, or decreased ROM--the patient likes to have VICKs cream topically. Also she takes Norco 1/2 tab qpm for pain in her knee, shoulder, and wrist.     Unspecified constipation Takes Colcace bid--no problem.     Family/ Staff Communication: observe.   Goals  of Care: SNF  Labs/tests ordered: none

## 2013-05-31 NOTE — Assessment & Plan Note (Signed)
No erythema, fever, swelling, or decreased ROM--the patient likes to have VICKs cream topically. Also she takes Norco 1/2 tab qpm for pain in her knee, shoulder, and wrist.

## 2013-05-31 NOTE — Assessment & Plan Note (Signed)
Takes Levothyroxine , last TSH 3.452 02/21/13

## 2013-05-31 NOTE — Assessment & Plan Note (Signed)
Takes Colcace bid--no problem   

## 2013-05-31 NOTE — Assessment & Plan Note (Signed)
Creatinine was 1.12 03/10/13--increased Furosemide likely contributes to the problem-

## 2013-05-31 NOTE — Assessment & Plan Note (Signed)
Rate controlled, continue anticoagulation with Coumadin . Takes Metoprolol 12.5mg daily.     

## 2013-06-28 ENCOUNTER — Encounter: Payer: Self-pay | Admitting: Nurse Practitioner

## 2013-07-01 ENCOUNTER — Non-Acute Institutional Stay (SKILLED_NURSING_FACILITY): Payer: Medicare Other | Admitting: Nurse Practitioner

## 2013-07-01 ENCOUNTER — Encounter: Payer: Self-pay | Admitting: Nurse Practitioner

## 2013-07-01 DIAGNOSIS — J069 Acute upper respiratory infection, unspecified: Secondary | ICD-10-CM | POA: Insufficient documentation

## 2013-07-01 DIAGNOSIS — W19XXXA Unspecified fall, initial encounter: Secondary | ICD-10-CM

## 2013-07-01 DIAGNOSIS — E039 Hypothyroidism, unspecified: Secondary | ICD-10-CM

## 2013-07-01 DIAGNOSIS — I4891 Unspecified atrial fibrillation: Secondary | ICD-10-CM

## 2013-07-01 DIAGNOSIS — W19XXXD Unspecified fall, subsequent encounter: Secondary | ICD-10-CM

## 2013-07-01 DIAGNOSIS — I10 Essential (primary) hypertension: Secondary | ICD-10-CM

## 2013-07-01 DIAGNOSIS — I509 Heart failure, unspecified: Secondary | ICD-10-CM

## 2013-07-01 DIAGNOSIS — N182 Chronic kidney disease, stage 2 (mild): Secondary | ICD-10-CM

## 2013-07-01 NOTE — Assessment & Plan Note (Signed)
Nasal congestion and fever of 100.6--no apparent cough. Diffused mild wheezes appreciated upon my visit today--likely reactive airway spasm--will start Z-pack, DuoNeb, Medrol dose pack for symptomatic management. PT/INR next Monday. Update CBC and CMP

## 2013-07-01 NOTE — Assessment & Plan Note (Signed)
Rate controlled, continue anticoagulation with Coumadin . Takes Metoprolol 12.5mg daily.     

## 2013-07-01 NOTE — Assessment & Plan Note (Signed)
Takes Levothyroxine , last TSH 3.452 02/21/13, update TSH

## 2013-07-01 NOTE — Progress Notes (Signed)
This encounter was created in error - please disregard.

## 2013-07-01 NOTE — Assessment & Plan Note (Signed)
06/27/13 CNA  Transferring resident from toilet to w/c and resident lost her balance. CNA lowered resident to the floor. Resident sustained abrasion to mid back and left shoulder blade area from scraping against w/c as she was lowered to the floor--no s/s of infection.

## 2013-07-01 NOTE — Progress Notes (Signed)
Patient ID: [name], female   DOB: 11-06-1914, 79 y.o.   MRN: [id number]   Code Status: DNR  Allergies  Allergen Reactions  . Amiodarone   . Penicillins   . Amoxicillin   . Celecoxib   . Ciprofloxacin   . Codeine   . Diltiazem   . Lactobacillus [Acidophilus Lactobacillus]   . Metoprolol Succinate     All Beta-blockers  . Metronidazole Hcl   . Other     floricene dye netribudazike    Chief Complaint  Patient presents with  . Medical Managment of Chronic Issues    wheezes, T 100.6  . Acute Visit    HPI: Patient is a 79 y.o. female seen in the SNF at [facility] today for evaluation of wheezes, fever,  and other chronic medical conditions.  Problem List Items Addressed This Visit   Unspecified hypothyroidism     Takes Levothyroxine , last TSH 3.452 02/21/13, update TSH                  Unspecified essential hypertension (Chronic)     controlled            Fall     06/27/13 CNA  Transferring resident from toilet to w/c and resident lost her balance. CNA lowered resident to the floor. Resident sustained abrasion to mid back and left shoulder blade area from scraping against w/c as she was lowered to the floor--no s/s of infection.     Congestive heart failure, unspecified     Saw Dr. Alanda Amass 11/02/12 increased to Furosemide to 20mg  and 40mg  alternating dose for increased edema-mainly in her LLE--Bun/creat 4/21/4 27/1.21 and 30/1.86 02/21/13 and 29/1.12 03/10/13,  continue daily weight, compression hosiery. Only trace pedal edema seen.                     CKD (chronic kidney disease), stage II     Creatinine was 1.12 03/10/13--increased Furosemide likely contributes to the problem-update CMP          Atrial fibrillation - Primary     Rate controlled, continue anticoagulation with Coumadin . Takes Metoprolol 12.5mg  daily.                     Acute upper respiratory infections of unspecified site   Nasal congestion and fever of 100.6--no apparent cough. Diffused mild wheezes appreciated upon my visit today--likely reactive airway spasm--will start Z-pack, DuoNeb, Medrol dose pack for symptomatic management. PT/INR next Monday. Update CBC and CMP       Review of Systems:  Review of Systems  Constitutional: Positive for fever and malaise/fatigue. Negative for chills, weight loss and diaphoresis.  HENT: Positive for congestion and hearing loss. Negative for ear discharge, ear pain and sore throat.   Eyes: Negative for blurred vision, pain, discharge and redness.  Respiratory: Positive for shortness of breath and wheezing. Negative for cough and sputum production.   Cardiovascular: Positive for leg swelling and PND. Negative for chest pain, palpitations, orthopnea and claudication.  Gastrointestinal: Negative for nausea, vomiting, abdominal pain, diarrhea, constipation and blood in stool.  Genitourinary: Positive for frequency. Negative for dysuria, urgency and flank pain.  Musculoskeletal: Positive for joint pain. Negative for back pain and neck pain.       Decreased ROM of the R+L shoulders for over head movement. C/o the left wrist pain  Skin: Negative for itching and rash.  Neurological: Negative for tremors, sensory change, speech change, focal weakness,  seizures, loss of consciousness, weakness and headaches.  Endo/Heme/Allergies: Negative for environmental allergies and polydipsia. Does not bruise/bleed easily.  Psychiatric/Behavioral: Positive for memory loss. Negative for hallucinations. The patient is not nervous/anxious and does not have insomnia.      Past Medical History  Diagnosis Date  . Anemia   . Arthritis   . CHF (congestive heart failure)   . Depression   . Hypertension   . Chronic kidney disease   . Thyroid disease    Medications: Patient's Medications  New Prescriptions   No medications on file  Previous Medications   ACETAMINOPHEN (TYLENOL) 325 MG TABLET     Take 325 mg by mouth every 6 (six) hours as needed.     BETA CAROTENE W/MINERALS (OCUVITE) TABLET    Take 1 tablet by mouth daily.     CALCIUM-VITAMIN D (OSCAL WITH D) 500-200 MG-UNIT PER TABLET    Take 1 tablet by mouth 2 (two) times daily.     DOCUSATE SODIUM (COLACE) 100 MG CAPSULE    Take 100 mg by mouth 2 (two) times daily.     DOXYCYCLINE (VIBRAMYCIN) 100 MG CAPSULE    Take 100 mg by mouth 2 (two) times daily.   DULOXETINE (CYMBALTA) 30 MG CAPSULE    Take 30 mg by mouth daily.     FERROUS SULFATE 325 (65 FE) MG TABLET    Take 325 mg by mouth 2 (two) times daily with a meal.    FOLIC ACID (FOLVITE) 1 MG TABLET    Take 1 mg by mouth daily.     FUROSEMIDE (LASIX) 20 MG TABLET    Take 20 mg by mouth daily. ONE DAY 20 NEXT DAY 40 AND ALTERNATE.   LEVOTHYROXINE (SYNTHROID, LEVOTHROID) 50 MCG TABLET    Take 50 mcg by mouth daily before breakfast.   MAGNESIUM OXIDE (MAG-OX) 400 MG TABLET    Take 400 mg by mouth daily.     METOPROLOL SUCCINATE (TOPROL-XL) 25 MG 24 HR TABLET    Take 25 mg by mouth daily. Taking 1/2 tab    MULTIPLE VITAMIN (MULTIVITAMIN) TABLET    Take 1 tablet by mouth daily.     POTASSIUM CHLORIDE (MICRO-K) 10 MEQ CR CAPSULE    Take 10 mEq by mouth daily. Takes 20 meq (2) 10 meq daily    SENNOSIDES-DOCUSATE SODIUM (SENOKOT-S) 8.6-50 MG TABLET    Take by mouth daily.     WARFARIN (COUMADIN) 4 MG TABLET    Take 6 mg by mouth daily. Taking 6 mg alt with 5mg   Modified Medications   No medications on file  Discontinued Medications   No medications on file     Physical Exam: Physical Exam  Constitutional: She is oriented to person, place, and time. She appears well-developed and well-nourished. No distress.  HENT:  Head: Normocephalic and atraumatic.  Nasal congestion  Eyes: Conjunctivae and EOM are normal. Pupils are equal, round, and reactive to light.  Neck: Normal range of motion. Neck supple. No JVD present. No thyromegaly present.  Cardiovascular: Normal rate and normal  heart sounds.  Frequent extrasystoles are present.  Pulses:      Dorsalis pedis pulses are 2+ on the right side, and 1+ on the left side.  Pulmonary/Chest: She has wheezes. She has no rales. She exhibits no tenderness.  Abdominal: Soft. Bowel sounds are normal.  Musculoskeletal: She exhibits edema (left knee).       Left shoulder: She exhibits decreased range of motion, tenderness, crepitus and pain.  Legs: Anterior distal femur 2x2cm firm nodule palpated, fixed, hard, non tender. Reduced ROM of the R+L shoulder for over head activities. C/o the left wrist pain--no swelling, erythema, decreased ROM  Lymphadenopathy:    She has no cervical adenopathy.  Neurological: She is alert and oriented to person, place, and time. She has normal reflexes. No cranial nerve deficit. She exhibits normal muscle tone. Coordination normal.  Skin: Skin is dry. No rash noted. She is not diaphoretic. No erythema (left knee).  Psychiatric: She has a normal mood and affect. Her behavior is normal. Judgment and thought content normal. Cognition and memory are impaired. She exhibits abnormal recent memory.    Filed Vitals:   07/01/13 1149  BP: 140/80  Pulse: 68  Temp: 100.6 F (38.1 C)  TempSrc: Tympanic  Resp: 20      Labs reviewed: Basic Metabolic Panel:  Recent Labs  29/56/21 11/15/12 02/21/13 03/10/13  NA  --  141 137 139  K  --  4.5 4.9 4.6  BUN  --  27* 30* 29*  CREATININE  --  1.2* 1.9* 1.1  TSH 4.40  --  3.45  --    Liver Function Tests:  Recent Labs  11/15/12 02/21/13  AST 21 20  ALT 17 14  ALKPHOS 46 56   CBC:  Recent Labs  10/25/12 03/10/13 04/13/13 1150  WBC 12.7 25.2 21.6*  HGB 11.0* 10.8* 12.3  HCT 32* 32* 38.5  MCV  --   --  95  PLT 116* 182 115*   Past Procedures:  06/30/13 CXR no acute cardiopulmonary abnormalities can be identified.    Assessment/Plan Atrial fibrillation Rate controlled, continue anticoagulation with Coumadin . Takes Metoprolol 12.5mg   daily.                   CKD (chronic kidney disease), stage II Creatinine was 1.12 03/10/13--increased Furosemide likely contributes to the problem-update CMP        Congestive heart failure, unspecified Saw Dr. Alanda Amass 11/02/12 increased to Furosemide to 20mg  and 40mg  alternating dose for increased edema-mainly in her LLE--Bun/creat 4/21/4 27/1.21 and 30/1.86 02/21/13 and 29/1.12 03/10/13,  continue daily weight, compression hosiery. Only trace pedal edema seen.                   Unspecified essential hypertension controlled          Unspecified hypothyroidism Takes Levothyroxine , last TSH 3.452 02/21/13, update TSH                Fall 06/27/13 CNA  Transferring resident from toilet to w/c and resident lost her balance. CNA lowered resident to the floor. Resident sustained abrasion to mid back and left shoulder blade area from scraping against w/c as she was lowered to the floor--no s/s of infection.   Acute upper respiratory infections of unspecified site Nasal congestion and fever of 100.6--no apparent cough. Diffused mild wheezes appreciated upon my visit today--likely reactive airway spasm--will start Z-pack, DuoNeb, Medrol dose pack for symptomatic management. PT/INR next Monday. Update CBC and CMP    Family/ Staff Communication: observe the patient.   Goals of Care: SNF  Labs/tests ordered: CXR done 06/30/13. PT/INR next Monday. CBC, CMP, TSH

## 2013-07-01 NOTE — Assessment & Plan Note (Signed)
Saw Dr. Weintraub 11/02/12 increased to Furosemide to 20mg and 40mg alternating dose for increased edema-mainly in her LLE--Bun/creat 4/21/4 27/1.21 and 30/1.86 02/21/13 and 29/1.12 03/10/13,  continue daily weight, compression hosiery. Only trace pedal edema seen.                

## 2013-07-01 NOTE — Assessment & Plan Note (Signed)
controlled 

## 2013-07-01 NOTE — Assessment & Plan Note (Signed)
Creatinine was 1.12 03/10/13--increased Furosemide likely contributes to the problem-update CMP

## 2013-07-04 LAB — CBC AND DIFFERENTIAL
HCT: 35 % — AB (ref 36–46)
Hemoglobin: 12.5 g/dL (ref 12.0–16.0)
WBC: 19.3 10^3/mL

## 2013-07-04 LAB — TSH: TSH: 3.34 u[IU]/mL (ref 0.41–5.90)

## 2013-07-04 LAB — BASIC METABOLIC PANEL
BUN: 26 mg/dL — AB (ref 4–21)
Creatinine: 1.1 mg/dL (ref 0.5–1.1)
Glucose: 124 mg/dL
Sodium: 137 mmol/L (ref 137–147)

## 2013-07-04 LAB — HEPATIC FUNCTION PANEL
Alkaline Phosphatase: 49 U/L (ref 25–125)
Bilirubin, Total: 0.3 mg/dL

## 2013-07-04 LAB — PROTIME-INR

## 2013-07-04 LAB — POCT INR

## 2013-07-04 NOTE — Progress Notes (Signed)
CC:   [name]. [name], M.D. [facility]  DIAGNOSES: 1. Anemia of renal insufficiency. 2. Intermittent iron-deficiency anemia. 3. 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  CURRENT THERAPY:  IV iron as indicated.  INTERIM HISTORY:  [name] comes in for followup.  I last saw her back in June 2013.  Since then, she has been doing okay.  Apparently, she has been having some lab work done at the assisted living facility.  They found that she has been a little bit more anemic.  When we last saw her back in June of last year, her white cell count was 10.6, hemoglobin 12.7, hematocrit 38.1, platelet count was 113,000. Back in August of this year, her white cell count was up to 25.2. Hemoglobin was 10.8.  Platelet count was 182.  She has had no problems with bleeding.  Her appetite has been okay.  She has had no leg swelling.  Her left knee, which was operated on a couple of years ago, has been doing okay.  PHYSICAL EXAMINATION:  General:  This is an elderly white female who comes in a wheelchair.  She is in no distress.  She is very alert. Vital Signs:  Her temperature 97.7, pulse 59, respiratory rate 14, blood pressure 124/40, weight is 194.  Head and Neck:  Normocephalic, atraumatic skull.  There are no ocular or oral lesions.  There are no palpable cervical or supraclavicular lymph nodes.  Lungs:  Clear bilaterally.  There are no rales, wheezes, or rhonchi.  Cardiac: Regular rate and rhythm with a normal S1 and S2.  She has an occasional extra beat.  She has 1/6 systolic ejection murmur.  Abdomen:  Soft.  She has good bowel sounds.  There is no fluid wave.  There is no palpable hepatosplenomegaly.  Extremities:  Some chronic mild swelling of the right knee.  She has no edema in her lower legs.  Skin:  No rashes. Neurological:  No focal neurological deficits.  LABORATORY STUDIES:  White cell count is 21.6, hemoglobin 12.2, hematocrit 38.5, platelet count 115.  On her  peripheral blood smear, she does have an increase in monocytes. She has no nucleated red blood cells.  I do not see any obvious blasts. Her platelets are slightly decreased in number.  There is no rouleaux formation.  IMPRESSION:  [name] is a 79 year old white female with multifactorial hematologic issues.  Her white cell count is trending upward.  She does have an increase in monocytes in her smear.  As such, I suspect that she may have chronic myelomonocytic leukemia.  However, this is not going to be an issue for her.  She is 79 years old.  She is in good health right now.  What I watch for with her is her anemia.  Her hemoglobin is fine today.  I want to see her back in another 3 months.  I reviewed her lab work with her daughter.  I reviewed what I saw on her blood smear with her daughter.  Again this is all about [name]'s quality of life.  We are not going to treat her if she has a hematologic issue other than iron deficiency.    ______________________________ Josph Macho, M.D. PRE/MEDQ  D:  04/13/2013  T:  07/03/2013  Job:  [id number]

## 2013-07-08 ENCOUNTER — Telehealth: Payer: Self-pay | Admitting: Hematology & Oncology

## 2013-07-08 NOTE — Telephone Encounter (Signed)
Pt cx 12-15 she is sick will call back to reschedule

## 2013-07-11 ENCOUNTER — Ambulatory Visit: Payer: Medicare Other | Admitting: Hematology & Oncology

## 2013-07-11 ENCOUNTER — Other Ambulatory Visit: Payer: Medicare Other | Admitting: Lab

## 2013-07-12 ENCOUNTER — Encounter: Payer: Self-pay | Admitting: Nurse Practitioner

## 2013-07-12 ENCOUNTER — Non-Acute Institutional Stay (SKILLED_NURSING_FACILITY): Payer: Medicare Other | Admitting: Nurse Practitioner

## 2013-07-12 DIAGNOSIS — I509 Heart failure, unspecified: Secondary | ICD-10-CM

## 2013-07-12 DIAGNOSIS — I4891 Unspecified atrial fibrillation: Secondary | ICD-10-CM

## 2013-07-12 DIAGNOSIS — D72829 Elevated white blood cell count, unspecified: Secondary | ICD-10-CM

## 2013-07-12 DIAGNOSIS — I10 Essential (primary) hypertension: Secondary | ICD-10-CM

## 2013-07-12 DIAGNOSIS — S025XXA Fracture of tooth (traumatic), initial encounter for closed fracture: Secondary | ICD-10-CM | POA: Insufficient documentation

## 2013-07-12 DIAGNOSIS — K068 Other specified disorders of gingiva and edentulous alveolar ridge: Secondary | ICD-10-CM

## 2013-07-12 DIAGNOSIS — F332 Major depressive disorder, recurrent severe without psychotic features: Secondary | ICD-10-CM

## 2013-07-12 DIAGNOSIS — S025XXB Fracture of tooth (traumatic), initial encounter for open fracture: Secondary | ICD-10-CM

## 2013-07-12 DIAGNOSIS — E039 Hypothyroidism, unspecified: Secondary | ICD-10-CM

## 2013-07-12 DIAGNOSIS — J069 Acute upper respiratory infection, unspecified: Secondary | ICD-10-CM

## 2013-07-12 DIAGNOSIS — D509 Iron deficiency anemia, unspecified: Secondary | ICD-10-CM

## 2013-07-12 DIAGNOSIS — K055 Other periodontal diseases: Secondary | ICD-10-CM

## 2013-07-12 DIAGNOSIS — K5900 Constipation, unspecified: Secondary | ICD-10-CM

## 2013-07-12 LAB — PROTIME-INR: Protime: 34.6 seconds — AB (ref 10.0–13.8)

## 2013-07-12 LAB — CBC AND DIFFERENTIAL
HCT: 36 % (ref 36–46)
Platelets: 124 10*3/uL — AB (ref 150–399)
WBC: 37.3 10^3/mL

## 2013-07-12 LAB — POCT INR

## 2013-07-12 NOTE — Assessment & Plan Note (Signed)
Where the right upper broken tooth is. Pressure dressing for now. F/u CBC and PT/INR in am. Continue to hold Coumadin.

## 2013-07-12 NOTE — Assessment & Plan Note (Signed)
Hgb 12.3 07/12/13

## 2013-07-12 NOTE — Assessment & Plan Note (Signed)
Takes Levothyroxine 50mcg, last TSH 3.345 07/04/13   

## 2013-07-12 NOTE — Assessment & Plan Note (Signed)
Managed with Cymbalta 30mg.  

## 2013-07-12 NOTE — Progress Notes (Signed)
Patient ID: [name], female   DOB: 1915/05/07, 79 y.o.   MRN: [id number]   Code Status: DNR  Allergies  Allergen Reactions  . Amiodarone   . Penicillins   . Amoxicillin   . Celecoxib   . Ciprofloxacin   . Codeine   . Diltiazem   . Lactobacillus [Acidophilus Lactobacillus]   . Metoprolol Succinate     All Beta-blockers  . Metronidazole Hcl   . Other     floricene dye netribudazike    Chief Complaint  Patient presents with  . Medical Managment of Chronic Issues    hypertherapeutic INR, the right upper broken tooth bleeding.  . Acute Visit    HPI: Patient is a 79 y.o. female seen in the SNF at [facility] today for evaluation of the upper right broken tooth bleeding, INR 3s-Coumadin on hold,  and other chronic medical conditions.  Problem List Items Addressed This Visit   Unspecified essential hypertension (Chronic)     controlled              Unspecified hypothyroidism     Takes Levothyroxine , last TSH 3.345 07/04/13                    Iron deficiency anemia, unspecified     Hgb 12.3 07/12/13    Major depressive disorder, recurrent episode, severe, without mention of psychotic behavior     Managed with Cymbalta 30mg .        Atrial fibrillation     Rate controlled, continue anticoagulation with Coumadin . Takes Metoprolol 12.5mg  daily.                       Congestive heart failure, unspecified     Saw Dr. Alanda Amass 11/02/12 increased to Furosemide to 20mg  and 40mg  alternating dose for increased edema-mainly in her LLE--Bun/creat 4/21/4 27/1.21 and 30/1.86 02/21/13 and 29/1.12 03/10/13 and 26/1.11 07/04/13. continue daily weight, compression hosiery. Only trace pedal edema seen.                     Leukocytosis, unspecified     Worse--baseline 20-now 37-continue to monitor the patient as well as lab.     Unspecified constipation     Takes Colcace bid--no problem.       Acute upper  respiratory infections of unspecified site     Improved Nasal congestion and fever of 100.6--no apparent cough. Diffused mild wheezes appreciated upon my visit today--likely reactive airway spasm--will start Z-pack, DuoNeb, Medrol dose pack for symptomatic management.      Broken tooth     The right upper-bleeding, Hgb 12.3 07/12/13, Coumadin on hold since 07/11/13, Dental appointment 07/14/13. INR in 3s 07/12/13. F/u CBC and PT/INR in am.     Bleeding gums - Primary     Where the right upper broken tooth is. Pressure dressing for now. F/u CBC and PT/INR in am. Continue to hold Coumadin.        Review of Systems:  Review of Systems  Constitutional: Negative for fever, chills, weight loss, malaise/fatigue and diaphoresis.  HENT: Positive for congestion and hearing loss. Negative for ear discharge, ear pain and sore throat.        The upper right tooth broken and bleeding for 2-3 days.   Eyes: Negative for blurred vision, pain, discharge and redness.  Respiratory: Negative for cough, sputum production, shortness of breath and wheezing.   Cardiovascular: Positive for leg swelling and  PND. Negative for chest pain, palpitations, orthopnea and claudication.  Gastrointestinal: Negative for nausea, vomiting, abdominal pain, diarrhea, constipation and blood in stool.  Genitourinary: Positive for frequency. Negative for dysuria, urgency and flank pain.  Musculoskeletal: Positive for joint pain. Negative for back pain and neck pain.       Decreased ROM of the R+L shoulders for over head movement. C/o the left wrist pain  Skin: Negative for itching and rash.  Neurological: Negative for tremors, sensory change, speech change, focal weakness, seizures, loss of consciousness, weakness and headaches.  Endo/Heme/Allergies: Negative for environmental allergies and polydipsia. Does not bruise/bleed easily.  Psychiatric/Behavioral: Positive for memory loss. Negative for hallucinations. The patient is not  nervous/anxious and does not have insomnia.      Past Medical History  Diagnosis Date  . Anemia   . Arthritis   . CHF (congestive heart failure)   . Depression   . Hypertension   . Chronic kidney disease   . Thyroid disease    Medications: Patient's Medications  New Prescriptions   No medications on file  Previous Medications   ACETAMINOPHEN (TYLENOL) 325 MG TABLET    Take 325 mg by mouth every 6 (six) hours as needed.     BETA CAROTENE W/MINERALS (OCUVITE) TABLET    Take 1 tablet by mouth daily.     CALCIUM-VITAMIN D (OSCAL WITH D) 500-200 MG-UNIT PER TABLET    Take 1 tablet by mouth 2 (two) times daily.     DOCUSATE SODIUM (COLACE) 100 MG CAPSULE    Take 100 mg by mouth 2 (two) times daily.     DOXYCYCLINE (VIBRAMYCIN) 100 MG CAPSULE    Take 100 mg by mouth 2 (two) times daily.   DULOXETINE (CYMBALTA) 30 MG CAPSULE    Take 30 mg by mouth daily.     FERROUS SULFATE 325 (65 FE) MG TABLET    Take 325 mg by mouth 2 (two) times daily with a meal.    FOLIC ACID (FOLVITE) 1 MG TABLET    Take 1 mg by mouth daily.     FUROSEMIDE (LASIX) 20 MG TABLET    Take 20 mg by mouth daily. ONE DAY 20 NEXT DAY 40 AND ALTERNATE.   LEVOTHYROXINE (SYNTHROID, LEVOTHROID) 50 MCG TABLET    Take 50 mcg by mouth daily before breakfast.   MAGNESIUM OXIDE (MAG-OX) 400 MG TABLET    Take 400 mg by mouth daily.     METOPROLOL SUCCINATE (TOPROL-XL) 25 MG 24 HR TABLET    Take 25 mg by mouth daily. Taking 1/2 tab    MULTIPLE VITAMIN (MULTIVITAMIN) TABLET    Take 1 tablet by mouth daily.     POTASSIUM CHLORIDE (MICRO-K) 10 MEQ CR CAPSULE    Take 10 mEq by mouth daily. Takes 20 meq (2) 10 meq daily    SENNOSIDES-DOCUSATE SODIUM (SENOKOT-S) 8.6-50 MG TABLET    Take by mouth daily.     WARFARIN (COUMADIN) 4 MG TABLET    Take 6 mg by mouth daily. Taking 6 mg alt with 5mg   Modified Medications   No medications on file  Discontinued Medications   No medications on file     Physical Exam: Physical Exam    Constitutional: She is oriented to person, place, and time. She appears well-developed and well-nourished. No distress.  HENT:  Head: Normocephalic and atraumatic.  The right upper tooth broken and bleeding.   Eyes: Conjunctivae and EOM are normal. Pupils are equal, round, and reactive to light.  Neck: Normal range of motion. Neck supple. No JVD present. No thyromegaly present.  Cardiovascular: Normal rate and normal heart sounds.  Frequent extrasystoles are present.  Pulses:      Dorsalis pedis pulses are 2+ on the right side, and 1+ on the left side.  Pulmonary/Chest: She has no wheezes. She has no rales. She exhibits no tenderness.  Abdominal: Soft. Bowel sounds are normal.  Musculoskeletal: She exhibits edema (left knee).       Left shoulder: She exhibits decreased range of motion, tenderness, crepitus and pain.       Legs: Anterior distal femur 2x2cm firm nodule palpated, fixed, hard, non tender. Reduced ROM of the R+L shoulder for over head activities. C/o the left wrist pain--no swelling, erythema, decreased ROM  Lymphadenopathy:    She has no cervical adenopathy.  Neurological: She is alert and oriented to person, place, and time. She has normal reflexes. No cranial nerve deficit. She exhibits normal muscle tone. Coordination normal.  Skin: Skin is dry. No rash noted. She is not diaphoretic. No erythema (left knee).  Psychiatric: She has a normal mood and affect. Her behavior is normal. Judgment and thought content normal. Cognition and memory are impaired. She exhibits abnormal recent memory.    Filed Vitals:   07/12/13 1655  BP: 120/79  Pulse: 72  Temp: 99 F (37.2 C)  TempSrc: Tympanic  Resp: 18      Labs reviewed: Basic Metabolic Panel:  Recent Labs  16/10/96  02/21/13 03/10/13 07/04/13  NA  --   < > 137 139 137  K  --   < > 4.9 4.6 4.4  BUN  --   < > 30* 29* 26*  CREATININE  --   < > 1.9* 1.1 1.1  TSH 4.40  --  3.45  --  3.34  < > = values in this interval  not displayed. Liver Function Tests:  Recent Labs  11/15/12 02/21/13 07/04/13  AST 21 20 29   ALT 17 14 19   ALKPHOS 46 56 49   CBC:  Recent Labs  04/13/13 1150 07/04/13 07/12/13  WBC 21.6* 19.3 37.3  HGB 12.3 12.5 12.3  HCT 38.5 35* 36  MCV 95  --   --   PLT 115* 108* 124*   Past Procedures:  06/30/13 CXR no acute cardiopulmonary abnormalities can be identified.    Assessment/Plan Acute upper respiratory infections of unspecified site Improved Nasal congestion and fever of 100.6--no apparent cough. Diffused mild wheezes appreciated upon my visit today--likely reactive airway spasm--will start Z-pack, DuoNeb, Medrol dose pack for symptomatic management.    Unspecified constipation Takes Colcace bid--no problem.     Leukocytosis, unspecified Worse--baseline 20-now 37-continue to monitor the patient as well as lab.   Congestive heart failure, unspecified Saw Dr. Alanda Amass 11/02/12 increased to Furosemide to 20mg  and 40mg  alternating dose for increased edema-mainly in her LLE--Bun/creat 4/21/4 27/1.21 and 30/1.86 02/21/13 and 29/1.12 03/10/13 and 26/1.11 07/04/13. continue daily weight, compression hosiery. Only trace pedal edema seen.                   Atrial fibrillation Rate controlled, continue anticoagulation with Coumadin . Takes Metoprolol 12.5mg  daily.                     Major depressive disorder, recurrent episode, severe, without mention of psychotic behavior Managed with Cymbalta 30mg .      Iron deficiency anemia, unspecified Hgb 12.3 07/12/13  Unspecified hypothyroidism Takes Levothyroxine , last  TSH 3.345 07/04/13                  Unspecified essential hypertension controlled            Broken tooth The right upper-bleeding, Hgb 12.3 07/12/13, Coumadin on hold since 07/11/13, Dental appointment 07/14/13. INR in 3s 07/12/13. F/u CBC and PT/INR in am.   Bleeding gums Where the right upper  broken tooth is. Pressure dressing for now. F/u CBC and PT/INR in am. Continue to hold Coumadin.     Family/ Staff Communication: observe the patient.   Goals of Care: SNF  Labs/tests ordered: CBC, PT/INR in am.

## 2013-07-12 NOTE — Assessment & Plan Note (Signed)
Worse--baseline 20-now 37-continue to monitor the patient as well as lab.

## 2013-07-12 NOTE — Assessment & Plan Note (Signed)
Takes Colcace bid--no problem   

## 2013-07-12 NOTE — Assessment & Plan Note (Signed)
The right upper-bleeding, Hgb 12.3 07/12/13, Coumadin on hold since 07/11/13, Dental appointment 07/14/13. INR in 3s 07/12/13. F/u CBC and PT/INR in am.

## 2013-07-12 NOTE — Assessment & Plan Note (Signed)
Rate controlled, continue anticoagulation with Coumadin . Takes Metoprolol 12.5mg daily.     

## 2013-07-12 NOTE — Assessment & Plan Note (Signed)
Improved Nasal congestion and fever of 100.6--no apparent cough. Diffused mild wheezes appreciated upon my visit today--likely reactive airway spasm--will start Z-pack, DuoNeb, Medrol dose pack for symptomatic management.

## 2013-07-12 NOTE — Assessment & Plan Note (Signed)
controlled 

## 2013-07-12 NOTE — Assessment & Plan Note (Signed)
Saw Dr. Weintraub 11/02/12 increased to Furosemide to 20mg and 40mg alternating dose for increased edema-mainly in her LLE--Bun/creat 4/21/4 27/1.21 and 30/1.86 02/21/13 and 29/1.12 03/10/13 and 26/1.11 07/04/13. continue daily weight, compression hosiery. Only trace pedal edema seen.    

## 2013-07-14 LAB — CBC AND DIFFERENTIAL
HCT: 34 % — AB (ref 36–46)
Hemoglobin: 11.6 g/dL — AB (ref 12.0–16.0)
Platelets: 156 10*3/uL (ref 150–399)
WBC: 35.4 10^3/mL

## 2013-08-08 LAB — PROTIME-INR: PROTIME: 21.2 s — AB (ref 10.0–13.8)

## 2013-08-08 LAB — POCT INR: INR: 1.9 — AB (ref 0.9–1.1)

## 2013-08-12 ENCOUNTER — Non-Acute Institutional Stay (SKILLED_NURSING_FACILITY): Payer: Medicare Other | Admitting: Nurse Practitioner

## 2013-08-12 DIAGNOSIS — E039 Hypothyroidism, unspecified: Secondary | ICD-10-CM

## 2013-08-12 DIAGNOSIS — F332 Major depressive disorder, recurrent severe without psychotic features: Secondary | ICD-10-CM

## 2013-08-12 DIAGNOSIS — D72829 Elevated white blood cell count, unspecified: Secondary | ICD-10-CM

## 2013-08-12 DIAGNOSIS — K5900 Constipation, unspecified: Secondary | ICD-10-CM

## 2013-08-12 DIAGNOSIS — M25519 Pain in unspecified shoulder: Secondary | ICD-10-CM

## 2013-08-12 DIAGNOSIS — I10 Essential (primary) hypertension: Secondary | ICD-10-CM

## 2013-08-12 DIAGNOSIS — I509 Heart failure, unspecified: Secondary | ICD-10-CM

## 2013-08-12 DIAGNOSIS — I4891 Unspecified atrial fibrillation: Secondary | ICD-10-CM

## 2013-08-16 ENCOUNTER — Encounter: Payer: Self-pay | Admitting: Nurse Practitioner

## 2013-08-16 NOTE — Progress Notes (Signed)
Patient ID: [name], female   DOB: [birth year], 80 y.o.   MRN: [id number]   Code Status: DNR  Allergies  Allergen Reactions  . Amiodarone   . Penicillins   . Amoxicillin   . Celecoxib   . Ciprofloxacin   . Codeine   . Diltiazem   . Lactobacillus [Acidophilus Lactobacillus]   . Metoprolol Succinate     All Beta-blockers  . Metronidazole Hcl   . Other     floricene dye netribudazike    Chief Complaint  Patient presents with  . Medical Managment of Chronic Issues    HPI: Patient is a 80 y.o. female seen in the SNF at [facility] today for evaluation of chronic medical conditions.  Problem List Items Addressed This Visit   Atrial fibrillation     Rate controlled, continue anticoagulation with Coumadin . Takes Metoprolol 12.5mg  daily.                         Congestive heart failure, unspecified     Saw Dr. Rollene Fare 11/02/12 increased to Furosemide to 20mg  and 40mg  alternating dose for increased edema-mainly in her LLE--Bun/creat 4/21/4 27/1.21 and 30/1.86 02/21/13 and 29/1.12 03/10/13 and 26/1.11 07/04/13. continue daily weight, compression hosiery. Only trace pedal edema seen. Update BMP                      Leukocytosis, unspecified     Worse--baseline 20-now 37-continue to monitor the patient as well as lab. Update CBC      Major depressive disorder, recurrent episode, severe, without mention of psychotic behavior     Managed with Cymbalta 30mg .        Pain in joint, shoulder region (Chronic)     Infected knee, s/p joint fusion, chronic ABT with Doxycycline 100mg  bid indefinitely. Takes Norco nightly for knee, shoulder, wrist pain.         Unspecified constipation     Takes Colcace bid--no problem.         Unspecified essential hypertension (Chronic)     controlled                Unspecified hypothyroidism - Primary     Takes Levothyroxine 6mcg, last TSH 3.345  07/04/13                         Review of Systems:  Review of Systems  Constitutional: Negative for fever, chills, weight loss, malaise/fatigue and diaphoresis.  HENT: Positive for congestion and hearing loss. Negative for ear discharge, ear pain and sore throat.   Eyes: Negative for blurred vision, pain, discharge and redness.  Respiratory: Negative for cough, sputum production, shortness of breath and wheezing.   Cardiovascular: Positive for leg swelling and PND. Negative for chest pain, palpitations, orthopnea and claudication.  Gastrointestinal: Negative for nausea, vomiting, abdominal pain, diarrhea, constipation and blood in stool.  Genitourinary: Positive for frequency. Negative for dysuria, urgency and flank pain.  Musculoskeletal: Positive for joint pain. Negative for back pain and neck pain.       Decreased ROM of the R+L shoulders for over head movement. C/o the left wrist pain  Skin: Negative for itching and rash.  Neurological: Negative for tremors, sensory change, speech change, focal weakness, seizures, loss of consciousness, weakness and headaches.  Endo/Heme/Allergies: Negative for environmental allergies and polydipsia. Does not bruise/bleed easily.  Psychiatric/Behavioral: Positive for memory loss. Negative for hallucinations. The patient  is not nervous/anxious and does not have insomnia.      Past Medical History  Diagnosis Date  . Anemia   . Arthritis   . CHF (congestive heart failure)   . Depression   . Hypertension   . Chronic kidney disease   . Thyroid disease    Medications: Patient's Medications  New Prescriptions   No medications on file  Previous Medications   ACETAMINOPHEN (TYLENOL) 325 MG TABLET    Take 325 mg by mouth every 6 (six) hours as needed.     BETA CAROTENE W/MINERALS (OCUVITE) TABLET    Take 1 tablet by mouth daily.     CALCIUM-VITAMIN D (OSCAL WITH D) 500-200 MG-UNIT PER TABLET    Take 1 tablet by mouth 2 (two) times  daily.     DOCUSATE SODIUM (COLACE) 100 MG CAPSULE    Take 100 mg by mouth 2 (two) times daily.     DOXYCYCLINE (VIBRAMYCIN) 100 MG CAPSULE    Take 100 mg by mouth 2 (two) times daily.   DULOXETINE (CYMBALTA) 30 MG CAPSULE    Take 30 mg by mouth daily.     FERROUS SULFATE 325 (65 FE) MG TABLET    Take 325 mg by mouth 2 (two) times daily with a meal.    FOLIC ACID (FOLVITE) 1 MG TABLET    Take 1 mg by mouth daily.     FUROSEMIDE (LASIX) 20 MG TABLET    Take 20 mg by mouth daily. ONE DAY 20 NEXT DAY 40 AND ALTERNATE.   LEVOTHYROXINE (SYNTHROID, LEVOTHROID) 50 MCG TABLET    Take 50 mcg by mouth daily before breakfast.   MAGNESIUM OXIDE (MAG-OX) 400 MG TABLET    Take 400 mg by mouth daily.     METOPROLOL SUCCINATE (TOPROL-XL) 25 MG 24 HR TABLET    Take 25 mg by mouth daily. Taking 1/2 tab    MULTIPLE VITAMIN (MULTIVITAMIN) TABLET    Take 1 tablet by mouth daily.     POTASSIUM CHLORIDE (MICRO-K) 10 MEQ CR CAPSULE    Take 10 mEq by mouth daily. Takes 20 meq (2) 10 meq daily    SENNOSIDES-DOCUSATE SODIUM (SENOKOT-S) 8.6-50 MG TABLET    Take by mouth daily.     WARFARIN (COUMADIN) 4 MG TABLET    Take 6 mg by mouth daily. Taking 6 mg alt with 5mg   Modified Medications   No medications on file  Discontinued Medications   No medications on file     Physical Exam: Physical Exam  Constitutional: She is oriented to person, place, and time. She appears well-developed and well-nourished. No distress.  HENT:  Head: Normocephalic and atraumatic.  Eyes: Conjunctivae and EOM are normal. Pupils are equal, round, and reactive to light.  Neck: Normal range of motion. Neck supple. No JVD present. No thyromegaly present.  Cardiovascular: Normal rate and normal heart sounds.  Frequent extrasystoles are present.  Pulses:      Dorsalis pedis pulses are 2+ on the right side, and 1+ on the left side.  Pulmonary/Chest: She has no wheezes. She has no rales. She exhibits no tenderness.  Abdominal: Soft. Bowel sounds  are normal.  Musculoskeletal: She exhibits edema (left knee).       Left shoulder: She exhibits decreased range of motion, tenderness, crepitus and pain.       Legs: Anterior distal femur 2x2cm firm nodule palpated, fixed, hard, non tender. Reduced ROM of the R+L shoulder for over head activities. C/o the left wrist  pain--no swelling, erythema, decreased ROM  Lymphadenopathy:    She has no cervical adenopathy.  Neurological: She is alert and oriented to person, place, and time. She has normal reflexes. No cranial nerve deficit. She exhibits normal muscle tone. Coordination normal.  Skin: Skin is dry. No rash noted. She is not diaphoretic. No erythema (left knee).  Psychiatric: She has a normal mood and affect. Her behavior is normal. Judgment and thought content normal. Cognition and memory are impaired. She exhibits abnormal recent memory.    Filed Vitals:   08/12/13 1721  BP: 122/66  Pulse: 68  Temp: 98.3 F (36.8 C)  TempSrc: Tympanic  Resp: 16      Labs reviewed: Basic Metabolic Panel:  Recent Labs  10/25/12  02/21/13 03/10/13 07/04/13  NA  --   < > 137 139 137  K  --   < > 4.9 4.6 4.4  BUN  --   < > 30* 29* 26*  CREATININE  --   < > 1.9* 1.1 1.1  TSH 4.40  --  3.45  --  3.34  < > = values in this interval not displayed. Liver Function Tests:  Recent Labs  11/15/12 02/21/13 07/04/13  AST 21 20 29   ALT 17 14 19   ALKPHOS 46 56 49   CBC:  Recent Labs  04/13/13 1150 07/04/13 07/12/13 07/14/13  WBC 21.6* 19.3 37.3 35.4  HGB 12.3 12.5 12.3 11.6*  HCT 38.5 35* 36 34*  MCV 95  --   --   --   PLT 115* 108* 124* 156   Past Procedures:  06/30/13 CXR no acute cardiopulmonary abnormalities can be identified.    Assessment/Plan Unspecified hypothyroidism Takes Levothyroxine 28mcg, last TSH 3.345 07/04/13                    Unspecified essential hypertension controlled              Unspecified constipation Takes Colcace bid--no  problem.       Major depressive disorder, recurrent episode, severe, without mention of psychotic behavior Managed with Cymbalta 30mg .      Leukocytosis, unspecified Worse--baseline 20-now 37-continue to monitor the patient as well as lab. Update CBC    Congestive heart failure, unspecified Saw Dr. Rollene Fare 11/02/12 increased to Furosemide to 20mg  and 40mg  alternating dose for increased edema-mainly in her LLE--Bun/creat 4/21/4 27/1.21 and 30/1.86 02/21/13 and 29/1.12 03/10/13 and 26/1.11 07/04/13. continue daily weight, compression hosiery. Only trace pedal edema seen. Update BMP                    Pain in joint, shoulder region Infected knee, s/p joint fusion, chronic ABT with Doxycycline 100mg  bid indefinitely. Takes Norco nightly for knee, shoulder, wrist pain.       Atrial fibrillation Rate controlled, continue anticoagulation with Coumadin . Takes Metoprolol 12.5mg  daily.                         Family/ Staff Communication: observe the patient.   Goals of Care: SNF  Labs/tests ordered: CBC and BMP

## 2013-08-16 NOTE — Assessment & Plan Note (Signed)
Infected knee, s/p joint fusion, chronic ABT with Doxycycline 100mg  bid indefinitely. Takes Norco nightly for knee, shoulder, wrist pain.

## 2013-08-16 NOTE — Assessment & Plan Note (Signed)
Takes Levothyroxine 50mcg, last TSH 3.345 07/04/13   

## 2013-08-16 NOTE — Assessment & Plan Note (Signed)
Managed with Cymbalta 30mg.  

## 2013-08-16 NOTE — Assessment & Plan Note (Signed)
Saw Dr. Rollene Fare 11/02/12 increased to Furosemide to 20mg  and 40mg  alternating dose for increased edema-mainly in her LLE--Bun/creat 4/21/4 27/1.21 and 30/1.86 02/21/13 and 29/1.12 03/10/13 and 26/1.11 07/04/13. continue daily weight, compression hosiery. Only trace pedal edema seen. Update BMP

## 2013-08-16 NOTE — Assessment & Plan Note (Signed)
Rate controlled, continue anticoagulation with Coumadin . Takes Metoprolol 12.5mg daily.     

## 2013-08-16 NOTE — Assessment & Plan Note (Signed)
Worse--baseline 20-now 37-continue to monitor the patient as well as lab. Update CBC

## 2013-08-16 NOTE — Assessment & Plan Note (Signed)
controlled 

## 2013-08-16 NOTE — Assessment & Plan Note (Signed)
Takes Colcace bid--no problem   

## 2013-08-18 LAB — BASIC METABOLIC PANEL
BUN: 18 mg/dL (ref 4–21)
CREATININE: 1 mg/dL (ref 0.5–1.1)
Glucose: 99 mg/dL
Potassium: 4.3 mmol/L (ref 3.4–5.3)
Sodium: 137 mmol/L (ref 137–147)

## 2013-08-18 LAB — CBC AND DIFFERENTIAL
HCT: 32 % — AB (ref 36–46)
HEMOGLOBIN: 10.7 g/dL — AB (ref 12.0–16.0)
Platelets: 134 10*3/uL — AB (ref 150–399)
WBC: 13.9 10*3/mL

## 2013-08-19 ENCOUNTER — Non-Acute Institutional Stay (SKILLED_NURSING_FACILITY): Payer: Medicare Other | Admitting: Nurse Practitioner

## 2013-08-19 ENCOUNTER — Encounter: Payer: Self-pay | Admitting: Nurse Practitioner

## 2013-08-19 DIAGNOSIS — I4891 Unspecified atrial fibrillation: Secondary | ICD-10-CM

## 2013-08-19 DIAGNOSIS — N182 Chronic kidney disease, stage 2 (mild): Secondary | ICD-10-CM

## 2013-08-19 DIAGNOSIS — D72829 Elevated white blood cell count, unspecified: Secondary | ICD-10-CM

## 2013-08-19 DIAGNOSIS — I509 Heart failure, unspecified: Secondary | ICD-10-CM

## 2013-08-19 DIAGNOSIS — K5900 Constipation, unspecified: Secondary | ICD-10-CM

## 2013-08-19 DIAGNOSIS — I10 Essential (primary) hypertension: Secondary | ICD-10-CM

## 2013-08-19 DIAGNOSIS — E039 Hypothyroidism, unspecified: Secondary | ICD-10-CM

## 2013-08-19 DIAGNOSIS — K2900 Acute gastritis without bleeding: Secondary | ICD-10-CM | POA: Insufficient documentation

## 2013-08-19 DIAGNOSIS — D509 Iron deficiency anemia, unspecified: Secondary | ICD-10-CM

## 2013-08-19 DIAGNOSIS — F332 Major depressive disorder, recurrent severe without psychotic features: Secondary | ICD-10-CM

## 2013-08-19 NOTE — Assessment & Plan Note (Signed)
Onset 08/17/13--resolved today. Prn Phenergan 12.5mg  IM q4hr x48 hrs effective.

## 2013-08-19 NOTE — Assessment & Plan Note (Signed)
Hgb 12.3 07/12/13-10.7 08/18/13--broken tooth with bleeding contributed to the problem. Continue to monitor CBC

## 2013-08-19 NOTE — Assessment & Plan Note (Signed)
Worse--baseline 20-now 37-down to 13.9 08/18/13

## 2013-08-19 NOTE — Progress Notes (Signed)
Patient ID: [name], female   DOB: 13-Apr-1915, 80 y.o.   MRN: [id number]   Code Status: DNR  Allergies  Allergen Reactions  . Amiodarone   . Penicillins   . Amoxicillin   . Celecoxib   . Ciprofloxacin   . Codeine   . Diltiazem   . Lactobacillus [Acidophilus Lactobacillus]   . Metoprolol Succinate     All Beta-blockers  . Metronidazole Hcl   . Other     floricene dye netribudazike    Chief Complaint  Patient presents with  . Medical Managment of Chronic Issues    gastritis  . Acute Visit    HPI: Patient is a 80 y.o. female seen in the SNF at [facility] today for evaluation of acute gastritis and chronic medical conditions.  Problem List Items Addressed This Visit   Acute gastritis - Primary     Onset 08/17/13--resolved today. Prn Phenergan 12.5mg  IM q4hr x48 hrs effective.     Atrial fibrillation     Rate controlled, continue anticoagulation with Coumadin . Takes Metoprolol 12.5mg  daily.                           CKD (chronic kidney disease), stage II     Stable, Bun/creat 18/1.05 08/18/13    Congestive heart failure, unspecified     Saw Dr. Rollene Fare 11/02/12 increased to Furosemide to 20mg  and 40mg  alternating dose for increased edema-mainly in her LLE--Bun/creat 4/21/4 27/1.21 and 30/1.86 02/21/13 and 29/1.12 03/10/13 and 26/1.11 07/04/13. continue daily weight, compression hosiery. Only trace pedal edema seen.                         Iron deficiency anemia, unspecified     Hgb 12.3 07/12/13-10.7 08/18/13--broken tooth with bleeding contributed to the problem. Continue to monitor CBC      Leukocytosis, unspecified     Worse--baseline 20-now 37-down to 13.9 08/18/13        Major depressive disorder, recurrent episode, severe, without mention of psychotic behavior     Managed with Cymbalta 30mg .          Unspecified constipation     Takes Colcace bid--no problem.           Unspecified essential  hypertension (Chronic)     controlled                  Unspecified hypothyroidism     Takes Levothyroxine 92mcg, last TSH 3.345 07/04/13                           Review of Systems:  Review of Systems  Constitutional: Negative for fever, chills, weight loss, malaise/fatigue and diaphoresis.  HENT: Positive for congestion and hearing loss. Negative for ear discharge, ear pain and sore throat.   Eyes: Negative for blurred vision, pain, discharge and redness.  Respiratory: Negative for cough, sputum production, shortness of breath and wheezing.   Cardiovascular: Positive for leg swelling and PND. Negative for chest pain, palpitations, orthopnea and claudication.  Gastrointestinal: Positive for nausea, vomiting and abdominal pain. Negative for diarrhea, constipation and blood in stool.       08/17/13  Genitourinary: Positive for frequency. Negative for dysuria, urgency and flank pain.  Musculoskeletal: Positive for joint pain. Negative for back pain and neck pain.       Decreased ROM of the R+L shoulders for over  head movement. C/o the left wrist pain  Skin: Negative for itching and rash.  Neurological: Negative for tremors, sensory change, speech change, focal weakness, seizures, loss of consciousness, weakness and headaches.  Endo/Heme/Allergies: Negative for environmental allergies and polydipsia. Does not bruise/bleed easily.  Psychiatric/Behavioral: Positive for memory loss. Negative for hallucinations. The patient is not nervous/anxious and does not have insomnia.      Past Medical History  Diagnosis Date  . Anemia   . Arthritis   . CHF (congestive heart failure)   . Depression   . Hypertension   . Chronic kidney disease   . Thyroid disease    Medications: Patient's Medications  New Prescriptions   No medications on file  Previous Medications   ACETAMINOPHEN (TYLENOL) 325 MG TABLET    Take 325 mg by mouth every 6 (six) hours as needed.      BETA CAROTENE W/MINERALS (OCUVITE) TABLET    Take 1 tablet by mouth daily.     CALCIUM-VITAMIN D (OSCAL WITH D) 500-200 MG-UNIT PER TABLET    Take 1 tablet by mouth 2 (two) times daily.     DOCUSATE SODIUM (COLACE) 100 MG CAPSULE    Take 100 mg by mouth 2 (two) times daily.     DOXYCYCLINE (VIBRAMYCIN) 100 MG CAPSULE    Take 100 mg by mouth 2 (two) times daily.   DULOXETINE (CYMBALTA) 30 MG CAPSULE    Take 30 mg by mouth daily.     FERROUS SULFATE 325 (65 FE) MG TABLET    Take 325 mg by mouth 2 (two) times daily with a meal.    FOLIC ACID (FOLVITE) 1 MG TABLET    Take 1 mg by mouth daily.     FUROSEMIDE (LASIX) 20 MG TABLET    Take 20 mg by mouth daily. ONE DAY 20 NEXT DAY 40 AND ALTERNATE.   LEVOTHYROXINE (SYNTHROID, LEVOTHROID) 50 MCG TABLET    Take 50 mcg by mouth daily before breakfast.   MAGNESIUM OXIDE (MAG-OX) 400 MG TABLET    Take 400 mg by mouth daily.     METOPROLOL SUCCINATE (TOPROL-XL) 25 MG 24 HR TABLET    Take 25 mg by mouth daily. Taking 1/2 tab    MULTIPLE VITAMIN (MULTIVITAMIN) TABLET    Take 1 tablet by mouth daily.     POTASSIUM CHLORIDE (MICRO-K) 10 MEQ CR CAPSULE    Take 10 mEq by mouth daily. Takes 20 meq (2) 10 meq daily    SENNOSIDES-DOCUSATE SODIUM (SENOKOT-S) 8.6-50 MG TABLET    Take by mouth daily.     WARFARIN (COUMADIN) 4 MG TABLET    Take 6 mg by mouth daily. Taking 6 mg alt with 5mg   Modified Medications   No medications on file  Discontinued Medications   No medications on file     Physical Exam: Physical Exam  Constitutional: She is oriented to person, place, and time. She appears well-developed and well-nourished. No distress.  HENT:  Head: Normocephalic and atraumatic.  Eyes: Conjunctivae and EOM are normal. Pupils are equal, round, and reactive to light.  Neck: Normal range of motion. Neck supple. No JVD present. No thyromegaly present.  Cardiovascular: Normal rate and normal heart sounds.  Frequent extrasystoles are present.  Pulses:      Dorsalis  pedis pulses are 2+ on the right side, and 1+ on the left side.  Pulmonary/Chest: She has no wheezes. She has no rales. She exhibits no tenderness.  Abdominal: Soft. Bowel sounds are normal.  Musculoskeletal:  She exhibits edema (left knee).       Left shoulder: She exhibits decreased range of motion, tenderness, crepitus and pain.       Legs: Anterior distal femur 2x2cm firm nodule palpated, fixed, hard, non tender. Reduced ROM of the R+L shoulder for over head activities. C/o the left wrist pain--no swelling, erythema, decreased ROM  Lymphadenopathy:    She has no cervical adenopathy.  Neurological: She is alert and oriented to person, place, and time. She has normal reflexes. No cranial nerve deficit. She exhibits normal muscle tone. Coordination normal.  Skin: Skin is dry. No rash noted. She is not diaphoretic. No erythema (left knee).  Psychiatric: She has a normal mood and affect. Her behavior is normal. Judgment and thought content normal. Cognition and memory are impaired. She exhibits abnormal recent memory.    Filed Vitals:   08/19/13 1629  BP: 124/56  Pulse: 86  Temp: 97.9 F (36.6 C)  TempSrc: Tympanic  Resp: 20      Labs reviewed: Basic Metabolic Panel:  Recent Labs  10/25/12  02/21/13 03/10/13 07/04/13 08/18/13  NA  --   < > 137 139 137 137  K  --   < > 4.9 4.6 4.4 4.3  BUN  --   < > 30* 29* 26* 18  CREATININE  --   < > 1.9* 1.1 1.1 1.0  TSH 4.40  --  3.45  --  3.34  --   < > = values in this interval not displayed. Liver Function Tests:  Recent Labs  11/15/12 02/21/13 07/04/13  AST 21 20 29   ALT 17 14 19   ALKPHOS 46 56 49   CBC:  Recent Labs  04/13/13 1150  07/12/13 07/14/13 08/18/13  WBC 21.6*  < > 37.3 35.4 13.9  HGB 12.3  < > 12.3 11.6* 10.7*  HCT 38.5  < > 36 34* 32*  MCV 95  --   --   --   --   PLT 115*  < > 124* 156 134*  < > = values in this interval not displayed. Past Procedures:  06/30/13 CXR no acute cardiopulmonary abnormalities can  be identified.    Assessment/Plan Acute gastritis Onset 08/17/13--resolved today. Prn Phenergan 12.5mg  IM q4hr x48 hrs effective.   Atrial fibrillation Rate controlled, continue anticoagulation with Coumadin . Takes Metoprolol 12.5mg  daily.                         CKD (chronic kidney disease), stage II Stable, Bun/creat 18/1.05 08/18/13  Congestive heart failure, unspecified Saw Dr. Rollene Fare 11/02/12 increased to Furosemide to 20mg  and 40mg  alternating dose for increased edema-mainly in her LLE--Bun/creat 4/21/4 27/1.21 and 30/1.86 02/21/13 and 29/1.12 03/10/13 and 26/1.11 07/04/13. continue daily weight, compression hosiery. Only trace pedal edema seen.                       Iron deficiency anemia, unspecified Hgb 12.3 07/12/13-10.7 08/18/13--broken tooth with bleeding contributed to the problem. Continue to monitor CBC    Leukocytosis, unspecified Worse--baseline 20-now 37-down to 13.9 08/18/13      Major depressive disorder, recurrent episode, severe, without mention of psychotic behavior Managed with Cymbalta 30mg .        Unspecified constipation Takes Colcace bid--no problem.         Unspecified essential hypertension controlled                Unspecified hypothyroidism Takes Levothyroxine 46mcg,  last TSH 3.345 07/04/13                        Family/ Staff Communication: observe the patient.   Goals of Care: SNF  Labs/tests ordered: none

## 2013-08-19 NOTE — Assessment & Plan Note (Signed)
Stable, Bun/creat 18/1.05 08/18/13

## 2013-08-19 NOTE — Assessment & Plan Note (Signed)
Takes Levothyroxine 50mcg, last TSH 3.345 07/04/13   

## 2013-08-19 NOTE — Assessment & Plan Note (Signed)
Rate controlled, continue anticoagulation with Coumadin . Takes Metoprolol 12.5mg daily.     

## 2013-08-19 NOTE — Assessment & Plan Note (Signed)
Managed with Cymbalta 30mg.  

## 2013-08-19 NOTE — Assessment & Plan Note (Signed)
Takes Colcace bid--no problem   

## 2013-08-19 NOTE — Assessment & Plan Note (Signed)
controlled 

## 2013-08-19 NOTE — Assessment & Plan Note (Signed)
Saw Dr. Weintraub 11/02/12 increased to Furosemide to 20mg and 40mg alternating dose for increased edema-mainly in her LLE--Bun/creat 4/21/4 27/1.21 and 30/1.86 02/21/13 and 29/1.12 03/10/13 and 26/1.11 07/04/13. continue daily weight, compression hosiery. Only trace pedal edema seen.    

## 2013-09-13 ENCOUNTER — Non-Acute Institutional Stay (SKILLED_NURSING_FACILITY): Payer: Medicare Other | Admitting: Nurse Practitioner

## 2013-09-13 ENCOUNTER — Encounter: Payer: Self-pay | Admitting: Nurse Practitioner

## 2013-09-13 DIAGNOSIS — D509 Iron deficiency anemia, unspecified: Secondary | ICD-10-CM

## 2013-09-13 DIAGNOSIS — M25519 Pain in unspecified shoulder: Secondary | ICD-10-CM

## 2013-09-13 DIAGNOSIS — F332 Major depressive disorder, recurrent severe without psychotic features: Secondary | ICD-10-CM

## 2013-09-13 DIAGNOSIS — K5900 Constipation, unspecified: Secondary | ICD-10-CM

## 2013-09-13 DIAGNOSIS — I10 Essential (primary) hypertension: Secondary | ICD-10-CM

## 2013-09-13 DIAGNOSIS — I509 Heart failure, unspecified: Secondary | ICD-10-CM

## 2013-09-13 DIAGNOSIS — I4891 Unspecified atrial fibrillation: Secondary | ICD-10-CM

## 2013-09-13 DIAGNOSIS — E039 Hypothyroidism, unspecified: Secondary | ICD-10-CM

## 2013-09-13 NOTE — Progress Notes (Signed)
Patient ID: [name], female   DOB: May 17, 1915, 80 y.o.   MRN: [id number]   Code Status: DNR  Allergies  Allergen Reactions  . Amiodarone   . Penicillins   . Amoxicillin   . Celecoxib   . Ciprofloxacin   . Codeine   . Diltiazem   . Lactobacillus [Acidophilus Lactobacillus]   . Metoprolol Succinate     All Beta-blockers  . Metronidazole Hcl   . Other     floricene dye netribudazike    Chief Complaint  Patient presents with  . Medical Managment of Chronic Issues    x1 N/V and elevated BP 2 days ago.     HPI: Patient is a 80 y.o. female seen in the SNF at [facility] today for evaluation of chronic medical conditions.  Problem List Items Addressed This Visit   Unspecified hypothyroidism     Takes Levothyroxine 39mcg, last TSH 3.345 07/04/13     Unspecified essential hypertension - Primary (Chronic)     Controlled. x1 elevated Bp 180/70, N/V x1 2 days ago. Afebrile. Denied dysuria, HA, change of vision, chest pain, SOB, cough, or abd pain. No diarrhea.     Unspecified constipation     Takes Colcace bid--no problem    Pain in joint, shoulder region (Chronic)     Infected knee, s/p joint fusion, chronic ABT with Doxycycline 100mg  bid indefinitely. Takes Norco nightly for knee, shoulder, wrist pain.       Major depressive disorder, recurrent episode, severe, without mention of psychotic behavior     Managed with Cymbalta 30mg .     Iron deficiency anemia, unspecified     Hgb 12.3 07/12/13-10.7 08/18/13--broken tooth with bleeding contributed to the problem. Continue to monitor CBC    Congestive heart failure, unspecified     Saw Dr. Rollene Fare 11/02/12 increased to Furosemide to 20mg  and 40mg  alternating dose for increased edema-mainly in her LLE--Bun/creat 4/21/4 27/1.21 and 30/1.86 02/21/13 and 29/1.12 03/10/13 and 26/1.11 07/04/13. continue daily weight, compression hosiery. Only trace pedal edema seen.       Atrial fibrillation     Rate controlled,  continue anticoagulation with Coumadin . Takes Metoprolol 12.5mg  daily.          Review of Systems:  Review of Systems  Constitutional: Negative for fever, chills, weight loss, malaise/fatigue and diaphoresis.  HENT: Positive for congestion and hearing loss. Negative for ear discharge, ear pain and sore throat.   Eyes: Negative for blurred vision, pain, discharge and redness.  Respiratory: Negative for cough, sputum production, shortness of breath and wheezing.   Cardiovascular: Positive for leg swelling and PND. Negative for chest pain, palpitations, orthopnea and claudication.  Gastrointestinal: Positive for nausea and vomiting. Negative for abdominal pain, diarrhea, constipation and blood in stool.       X1 2 days ago-resolved without intervention.   Genitourinary: Positive for frequency. Negative for dysuria, urgency and flank pain.  Musculoskeletal: Positive for joint pain. Negative for back pain and neck pain.       Decreased ROM of the R+L shoulders for over head movement. C/o the left wrist pain  Skin: Negative for itching and rash.  Neurological: Negative for tremors, sensory change, speech change, focal weakness, seizures, loss of consciousness, weakness and headaches.  Endo/Heme/Allergies: Negative for environmental allergies and polydipsia. Does not bruise/bleed easily.  Psychiatric/Behavioral: Positive for memory loss. Negative for hallucinations. The patient is not nervous/anxious and does not have insomnia.      Past Medical History  Diagnosis  Date  . Anemia   . Arthritis   . CHF (congestive heart failure)   . Depression   . Hypertension   . Chronic kidney disease   . Thyroid disease    Medications: Patient's Medications  New Prescriptions   No medications on file  Previous Medications   ACETAMINOPHEN (TYLENOL) 325 MG TABLET    Take 325 mg by mouth every 6 (six) hours as needed.     BETA CAROTENE W/MINERALS (OCUVITE) TABLET    Take 1 tablet by mouth daily.       CALCIUM-VITAMIN D (OSCAL WITH D) 500-200 MG-UNIT PER TABLET    Take 1 tablet by mouth 2 (two) times daily.     DOCUSATE SODIUM (COLACE) 100 MG CAPSULE    Take 100 mg by mouth 2 (two) times daily.     DOXYCYCLINE (VIBRAMYCIN) 100 MG CAPSULE    Take 100 mg by mouth 2 (two) times daily.   DULOXETINE (CYMBALTA) 30 MG CAPSULE    Take 30 mg by mouth daily.     FERROUS SULFATE 325 (65 FE) MG TABLET    Take 325 mg by mouth 2 (two) times daily with a meal.    FOLIC ACID (FOLVITE) 1 MG TABLET    Take 1 mg by mouth daily.     FUROSEMIDE (LASIX) 20 MG TABLET    Take 20 mg by mouth daily. ONE DAY 20 NEXT DAY 40 AND ALTERNATE.   LEVOTHYROXINE (SYNTHROID, LEVOTHROID) 50 MCG TABLET    Take 50 mcg by mouth daily before breakfast.   MAGNESIUM OXIDE (MAG-OX) 400 MG TABLET    Take 400 mg by mouth daily.     METOPROLOL SUCCINATE (TOPROL-XL) 25 MG 24 HR TABLET    Take 25 mg by mouth daily. Taking 1/2 tab    MULTIPLE VITAMIN (MULTIVITAMIN) TABLET    Take 1 tablet by mouth daily.     POTASSIUM CHLORIDE (MICRO-K) 10 MEQ CR CAPSULE    Take 10 mEq by mouth daily. Takes 20 meq (2) 10 meq daily    SENNOSIDES-DOCUSATE SODIUM (SENOKOT-S) 8.6-50 MG TABLET    Take by mouth daily.     WARFARIN (COUMADIN) 4 MG TABLET    Take 6 mg by mouth daily. Taking 6 mg alt with 5mg   Modified Medications   No medications on file  Discontinued Medications   No medications on file     Physical Exam: Physical Exam  Constitutional: She is oriented to person, place, and time. She appears well-developed and well-nourished. No distress.  HENT:  Head: Normocephalic and atraumatic.  Eyes: Conjunctivae and EOM are normal. Pupils are equal, round, and reactive to light.  Neck: Normal range of motion. Neck supple. No JVD present. No thyromegaly present.  Cardiovascular: Normal rate and normal heart sounds.  Frequent extrasystoles are present.  Pulses:      Dorsalis pedis pulses are 2+ on the right side, and 1+ on the left side.   Pulmonary/Chest: She has no wheezes. She has no rales. She exhibits no tenderness.  Abdominal: Soft. Bowel sounds are normal.  Musculoskeletal: She exhibits edema (left knee).       Left shoulder: She exhibits decreased range of motion, tenderness, crepitus and pain.       Legs: Anterior distal femur 2x2cm firm nodule palpated, fixed, hard, non tender. Reduced ROM of the R+L shoulder for over head activities. C/o the left wrist pain--no swelling, erythema, decreased ROM  Lymphadenopathy:    She has no cervical adenopathy.  Neurological:  She is alert and oriented to person, place, and time. She has normal reflexes. No cranial nerve deficit. She exhibits normal muscle tone. Coordination normal.  Skin: Skin is dry. No rash noted. She is not diaphoretic. No erythema (left knee).  Psychiatric: She has a normal mood and affect. Her behavior is normal. Judgment and thought content normal. Cognition and memory are impaired. She exhibits abnormal recent memory.    Filed Vitals:   09/13/13 2132  BP: 130/70  Pulse: 70  Temp: 97.2 F (36.2 C)  TempSrc: Tympanic  Resp: 16      Labs reviewed: Basic Metabolic Panel:  Recent Labs  10/25/12  02/21/13 03/10/13 07/04/13 08/18/13  NA  --   < > 137 139 137 137  K  --   < > 4.9 4.6 4.4 4.3  BUN  --   < > 30* 29* 26* 18  CREATININE  --   < > 1.9* 1.1 1.1 1.0  TSH 4.40  --  3.45  --  3.34  --   < > = values in this interval not displayed. Liver Function Tests:  Recent Labs  11/15/12 02/21/13 07/04/13  AST 21 20 29   ALT 17 14 19   ALKPHOS 46 56 49   CBC:  Recent Labs  04/13/13 1150  07/12/13 07/14/13 08/18/13  WBC 21.6*  < > 37.3 35.4 13.9  HGB 12.3  < > 12.3 11.6* 10.7*  HCT 38.5  < > 36 34* 32*  MCV 95  --   --   --   --   PLT 115*  < > 124* 156 134*  < > = values in this interval not displayed. Past Procedures:  06/30/13 CXR no acute cardiopulmonary abnormalities can be identified.    Assessment/Plan Unspecified essential  hypertension Controlled. x1 elevated Bp 180/70, N/V x1 2 days ago. Afebrile. Denied dysuria, HA, change of vision, chest pain, SOB, cough, or abd pain. No diarrhea.   Pain in joint, shoulder region Infected knee, s/p joint fusion, chronic ABT with Doxycycline 100mg  bid indefinitely. Takes Norco nightly for knee, shoulder, wrist pain.     Unspecified hypothyroidism Takes Levothyroxine 25mcg, last TSH 3.345 07/04/13   Iron deficiency anemia, unspecified Hgb 12.3 07/12/13-10.7 08/18/13--broken tooth with bleeding contributed to the problem. Continue to monitor CBC  Major depressive disorder, recurrent episode, severe, without mention of psychotic behavior Managed with Cymbalta 30mg .   Atrial fibrillation Rate controlled, continue anticoagulation with Coumadin . Takes Metoprolol 12.5mg  daily.     Congestive heart failure, unspecified Saw Dr. Rollene Fare 11/02/12 increased to Furosemide to 20mg  and 40mg  alternating dose for increased edema-mainly in her LLE--Bun/creat 4/21/4 27/1.21 and 30/1.86 02/21/13 and 29/1.12 03/10/13 and 26/1.11 07/04/13. continue daily weight, compression hosiery. Only trace pedal edema seen.     Unspecified constipation Takes Colcace bid--no problem    Pharmacist, hospital Communication: observe the patient.   Goals of Care: SNF  Labs/tests ordered: none

## 2013-09-15 NOTE — Assessment & Plan Note (Signed)
Hgb 12.3 07/12/13-10.7 08/18/13--broken tooth with bleeding contributed to the problem. Continue to monitor CBC   

## 2013-09-15 NOTE — Assessment & Plan Note (Signed)
Managed with Cymbalta 30mg.  

## 2013-09-15 NOTE — Assessment & Plan Note (Signed)
Rate controlled, continue anticoagulation with Coumadin . Takes Metoprolol 12.5mg daily.     

## 2013-09-15 NOTE — Assessment & Plan Note (Signed)
Saw Dr. Rollene Fare 11/02/12 increased to Furosemide to 20mg  and 40mg  alternating dose for increased edema-mainly in her LLE--Bun/creat 4/21/4 27/1.21 and 30/1.86 02/21/13 and 29/1.12 03/10/13 and 26/1.11 07/04/13. continue daily weight, compression hosiery. Only trace pedal edema seen.

## 2013-09-15 NOTE — Assessment & Plan Note (Signed)
Takes Colcace bid--no problem   

## 2013-09-15 NOTE — Assessment & Plan Note (Signed)
Takes Levothyroxine 50mcg, last TSH 3.345 07/04/13   

## 2013-09-15 NOTE — Assessment & Plan Note (Signed)
Controlled. x1 elevated Bp 180/70, N/V x1 2 days ago. Afebrile. Denied dysuria, HA, change of vision, chest pain, SOB, cough, or abd pain. No diarrhea.

## 2013-09-15 NOTE — Assessment & Plan Note (Signed)
Infected knee, s/p joint fusion, chronic ABT with Doxycycline 100mg bid indefinitely. Takes Norco nightly for knee, shoulder, wrist pain.      

## 2013-10-03 LAB — POCT INR: INR: 2.3 — AB (ref 0.9–1.1)

## 2013-10-03 LAB — PROTIME-INR: Protime: 24.4 seconds — AB (ref 10.0–13.8)

## 2013-10-11 ENCOUNTER — Non-Acute Institutional Stay (SKILLED_NURSING_FACILITY): Payer: Medicare Other | Admitting: Nurse Practitioner

## 2013-10-11 ENCOUNTER — Encounter: Payer: Self-pay | Admitting: Nurse Practitioner

## 2013-10-11 DIAGNOSIS — K5900 Constipation, unspecified: Secondary | ICD-10-CM

## 2013-10-11 DIAGNOSIS — I10 Essential (primary) hypertension: Secondary | ICD-10-CM

## 2013-10-11 DIAGNOSIS — D509 Iron deficiency anemia, unspecified: Secondary | ICD-10-CM

## 2013-10-11 DIAGNOSIS — F332 Major depressive disorder, recurrent severe without psychotic features: Secondary | ICD-10-CM

## 2013-10-11 DIAGNOSIS — I509 Heart failure, unspecified: Secondary | ICD-10-CM

## 2013-10-11 DIAGNOSIS — I4891 Unspecified atrial fibrillation: Secondary | ICD-10-CM

## 2013-10-11 DIAGNOSIS — I2699 Other pulmonary embolism without acute cor pulmonale: Secondary | ICD-10-CM

## 2013-10-11 DIAGNOSIS — M25519 Pain in unspecified shoulder: Secondary | ICD-10-CM

## 2013-10-11 DIAGNOSIS — E039 Hypothyroidism, unspecified: Secondary | ICD-10-CM

## 2013-10-11 NOTE — Assessment & Plan Note (Signed)
Hgb 12.3 07/12/13-10.7 08/18/13--broken tooth with bleeding contributed to the problem. No active bleeding.

## 2013-10-11 NOTE — Assessment & Plan Note (Signed)
Saw Dr. Weintraub 11/02/12 increased to Furosemide to 20mg and 40mg alternating dose for increased edema-clinically compensated in the past year, continue daily weight, compression hosiery. Only trace pedal edema seen.     

## 2013-10-11 NOTE — Assessment & Plan Note (Signed)
Takes Levothyroxine 69mcg, last TSH 3.345 07/04/13

## 2013-10-11 NOTE — Assessment & Plan Note (Signed)
Managed with Cymbalta 30mg.  

## 2013-10-11 NOTE — Assessment & Plan Note (Signed)
Takes Colcace bid--no problem   

## 2013-10-11 NOTE — Assessment & Plan Note (Signed)
Anticoagulation with Coumadin, present INR is therapeutic.

## 2013-10-11 NOTE — Assessment & Plan Note (Signed)
Pain is controlled with Tylenol 500mg bid and Norco 5/325 1/2 qhs.     

## 2013-10-11 NOTE — Progress Notes (Signed)
Patient ID: [name], female   DOB: [birth year], 80 y.o.   MRN: [place]:[id number]   Code Status: DNR  Allergies  Allergen Reactions  . Amiodarone   . Penicillins   . Amoxicillin   . Celecoxib   . Ciprofloxacin   . Codeine   . Diltiazem   . Lactobacillus [Acidophilus Lactobacillus]   . Metoprolol Succinate     All Beta-blockers  . Metronidazole Hcl   . Other     floricene dye netribudazike    Chief Complaint  Patient presents with  . Medical Managment of Chronic Issues    HPI: Patient is a 80 y.o. female seen in the SNF at [facility] today for evaluation of chronic medical conditions.  Problem List Items Addressed This Visit   Unspecified essential hypertension (Chronic)     Controlled, takes Furosemide 40mg  and 20mg  alternating dose and Metoprolol 12.5mg  daily.     Pain in joint, shoulder region (Chronic)     Pain is controlled with Tylenol 500mg  bid and Norco 5/325 1/2 qhs.     Unspecified hypothyroidism     Takes Levothyroxine 53mcg, last TSH 3.345 07/04/13     Iron deficiency anemia, unspecified     Hgb 12.3 07/12/13-10.7 08/18/13--broken tooth with bleeding contributed to the problem. No active bleeding.      Major depressive disorder, recurrent episode, severe, without mention of psychotic behavior     Managed with Cymbalta 30mg .      Other pulmonary embolism and infarction     Anticoagulation with Coumadin, present INR is therapeutic.     Atrial fibrillation - Primary     Rate controlled, continue anticoagulation with Coumadin . Takes Metoprolol 12.5mg  daily.     Congestive heart failure, unspecified     Saw Dr. Rollene Fare 11/02/12 increased to Furosemide to 20mg  and 40mg  alternating dose for increased edema-clinically compensated in the past year, continue daily weight, compression hosiery. Only trace pedal edema seen.     Unspecified constipation     Takes Colcace bid--no problem        Review of Systems:  Review of Systems  Constitutional:  Negative for fever, chills, weight loss, malaise/fatigue and diaphoresis.  HENT: Positive for hearing loss. Negative for congestion, ear discharge, ear pain and sore throat.   Eyes: Negative for blurred vision, pain, discharge and redness.  Respiratory: Negative for cough, sputum production, shortness of breath and wheezing.   Cardiovascular: Positive for leg swelling and PND. Negative for chest pain, palpitations, orthopnea and claudication.  Gastrointestinal: Negative for nausea, vomiting, abdominal pain, diarrhea, constipation and blood in stool.  Genitourinary: Positive for frequency. Negative for dysuria, urgency and flank pain.  Musculoskeletal: Positive for joint pain. Negative for back pain and neck pain.       Decreased ROM of the R+L shoulders for over head movement.   Skin: Negative for itching and rash.  Neurological: Negative for tremors, sensory change, speech change, focal weakness, seizures, loss of consciousness, weakness and headaches.  Endo/Heme/Allergies: Negative for environmental allergies and polydipsia. Does not bruise/bleed easily.  Psychiatric/Behavioral: Positive for memory loss. Negative for hallucinations. The patient is not nervous/anxious and does not have insomnia.      Past Medical History  Diagnosis Date  . Anemia   . Arthritis   . CHF (congestive heart failure)   . Depression   . Hypertension   . Chronic kidney disease   . Thyroid disease    Medications: Patient's Medications  New Prescriptions   No medications  on file  Previous Medications   ACETAMINOPHEN (TYLENOL) 325 MG TABLET    Take 325 mg by mouth every 6 (six) hours as needed.     BETA CAROTENE W/MINERALS (OCUVITE) TABLET    Take 1 tablet by mouth daily.     CALCIUM-VITAMIN D (OSCAL WITH D) 500-200 MG-UNIT PER TABLET    Take 1 tablet by mouth 2 (two) times daily.     DOCUSATE SODIUM (COLACE) 100 MG CAPSULE    Take 100 mg by mouth 2 (two) times daily.     DOXYCYCLINE (VIBRAMYCIN) 100 MG  CAPSULE    Take 100 mg by mouth 2 (two) times daily.   DULOXETINE (CYMBALTA) 30 MG CAPSULE    Take 30 mg by mouth daily.     FERROUS SULFATE 325 (65 FE) MG TABLET    Take 325 mg by mouth 2 (two) times daily with a meal.    FOLIC ACID (FOLVITE) 1 MG TABLET    Take 1 mg by mouth daily.     FUROSEMIDE (LASIX) 20 MG TABLET    Take 20 mg by mouth daily. ONE DAY 20 NEXT DAY 40 AND ALTERNATE.   LEVOTHYROXINE (SYNTHROID, LEVOTHROID) 50 MCG TABLET    Take 50 mcg by mouth daily before breakfast.   MAGNESIUM OXIDE (MAG-OX) 400 MG TABLET    Take 400 mg by mouth daily.     METOPROLOL SUCCINATE (TOPROL-XL) 25 MG 24 HR TABLET    Take 25 mg by mouth daily. Taking 1/2 tab    MULTIPLE VITAMIN (MULTIVITAMIN) TABLET    Take 1 tablet by mouth daily.     POTASSIUM CHLORIDE (MICRO-K) 10 MEQ CR CAPSULE    Take 10 mEq by mouth daily. Takes 20 meq (2) 10 meq daily    SENNOSIDES-DOCUSATE SODIUM (SENOKOT-S) 8.6-50 MG TABLET    Take by mouth daily.     WARFARIN (COUMADIN) 4 MG TABLET    Take 6 mg by mouth daily. Taking 6 mg alt with 5mg   Modified Medications   No medications on file  Discontinued Medications   No medications on file     Physical Exam: Physical Exam  Constitutional: She is oriented to person, place, and time. She appears well-developed and well-nourished. No distress.  HENT:  Head: Normocephalic and atraumatic.  Eyes: Conjunctivae and EOM are normal. Pupils are equal, round, and reactive to light.  Neck: Normal range of motion. Neck supple. No JVD present. No thyromegaly present.  Cardiovascular: Normal rate and normal heart sounds.  Frequent extrasystoles are present.  Pulses:      Dorsalis pedis pulses are 2+ on the right side, and 1+ on the left side.  Pulmonary/Chest: She has no wheezes. She has no rales. She exhibits no tenderness.  Abdominal: Soft. Bowel sounds are normal.  Musculoskeletal: She exhibits edema (left knee) and tenderness.       Left shoulder: She exhibits decreased range of  motion, tenderness, crepitus and pain.       Legs: Anterior distal femur 2x2cm firm nodule palpated, fixed, hard, non tender. Reduced ROM of the R+L shoulder for over head activities. Left wrist/left shoulder/left knee pain on and off.   Lymphadenopathy:    She has no cervical adenopathy.  Neurological: She is alert and oriented to person, place, and time. She has normal reflexes. No cranial nerve deficit. She exhibits normal muscle tone. Coordination normal.  Skin: Skin is dry. No rash noted. She is not diaphoretic. No erythema (left knee).  Psychiatric: She has a  normal mood and affect. Her behavior is normal. Judgment and thought content normal. Cognition and memory are impaired. She exhibits abnormal recent memory.    Filed Vitals:   10/11/13 1125  BP: 130/70  Pulse: 72  Temp: 98.2 F (36.8 C)  TempSrc: Tympanic  Resp: 18      Labs reviewed: Basic Metabolic Panel:  Recent Labs  10/25/12  02/21/13 03/10/13 07/04/13 08/18/13  NA  --   < > 137 139 137 137  K  --   < > 4.9 4.6 4.4 4.3  BUN  --   < > 30* 29* 26* 18  CREATININE  --   < > 1.9* 1.1 1.1 1.0  TSH 4.40  --  3.45  --  3.34  --   < > = values in this interval not displayed. Liver Function Tests:  Recent Labs  11/15/12 02/21/13 07/04/13  AST 21 20 29   ALT 17 14 19   ALKPHOS 46 56 49   CBC:  Recent Labs  04/13/13 1150  07/12/13 07/14/13 08/18/13  WBC 21.6*  < > 37.3 35.4 13.9  HGB 12.3  < > 12.3 11.6* 10.7*  HCT 38.5  < > 36 34* 32*  MCV 95  --   --   --   --   PLT 115*  < > 124* 156 134*  < > = values in this interval not displayed. Past Procedures:  06/30/13 CXR no acute cardiopulmonary abnormalities can be identified.    Assessment/Plan Unspecified essential hypertension Controlled, takes Furosemide 40mg  and 20mg  alternating dose and Metoprolol 12.5mg  daily.   Pain in joint, shoulder region Pain is controlled with Tylenol 500mg  bid and Norco 5/325 1/2 qhs.   Unspecified hypothyroidism Takes  Levothyroxine 10mcg, last TSH 3.345 07/04/13   Iron deficiency anemia, unspecified Hgb 12.3 07/12/13-10.7 08/18/13--broken tooth with bleeding contributed to the problem. No active bleeding.    Major depressive disorder, recurrent episode, severe, without mention of psychotic behavior Managed with Cymbalta 30mg .    Other pulmonary embolism and infarction Anticoagulation with Coumadin, present INR is therapeutic.   Atrial fibrillation Rate controlled, continue anticoagulation with Coumadin . Takes Metoprolol 12.5mg  daily.   Congestive heart failure, unspecified Saw Dr. Rollene Fare 11/02/12 increased to Furosemide to 20mg  and 40mg  alternating dose for increased edema-clinically compensated in the past year, continue daily weight, compression hosiery. Only trace pedal edema seen.   Unspecified constipation Takes Colcace bid--no problem     Pharmacist, hospital Communication: observe the patient.   Goals of Care: SNF  Labs/tests ordered: none

## 2013-10-11 NOTE — Assessment & Plan Note (Signed)
Controlled, takes Furosemide 40mg and 20mg alternating dose and Metoprolol 12.5mg daily.     

## 2013-10-11 NOTE — Assessment & Plan Note (Signed)
Rate controlled, continue anticoagulation with Coumadin . Takes Metoprolol 12.5mg daily.     

## 2013-11-03 LAB — POCT INR: INR: 2.7 — AB (ref 0.9–1.1)

## 2013-11-03 LAB — PROTIME-INR: Protime: 27.9 seconds — AB (ref 10.0–13.8)

## 2013-11-08 ENCOUNTER — Encounter: Payer: Self-pay | Admitting: Nurse Practitioner

## 2013-11-08 ENCOUNTER — Non-Acute Institutional Stay (SKILLED_NURSING_FACILITY): Payer: Medicare Other | Admitting: Nurse Practitioner

## 2013-11-08 DIAGNOSIS — F332 Major depressive disorder, recurrent severe without psychotic features: Secondary | ICD-10-CM

## 2013-11-08 DIAGNOSIS — I4891 Unspecified atrial fibrillation: Secondary | ICD-10-CM

## 2013-11-08 DIAGNOSIS — M25519 Pain in unspecified shoulder: Secondary | ICD-10-CM

## 2013-11-08 DIAGNOSIS — D509 Iron deficiency anemia, unspecified: Secondary | ICD-10-CM

## 2013-11-08 DIAGNOSIS — E039 Hypothyroidism, unspecified: Secondary | ICD-10-CM

## 2013-11-08 DIAGNOSIS — I10 Essential (primary) hypertension: Secondary | ICD-10-CM

## 2013-11-08 DIAGNOSIS — I509 Heart failure, unspecified: Secondary | ICD-10-CM

## 2013-11-08 NOTE — Assessment & Plan Note (Signed)
Pain is controlled with Tylenol 500mg bid and Norco 5/325 1/2 qhs.     

## 2013-11-08 NOTE — Assessment & Plan Note (Signed)
Hgb 10.7 6/86/16, takes Folic acid daily, no active bleed.

## 2013-11-08 NOTE — Assessment & Plan Note (Signed)
Rate controlled, continue anticoagulation with Coumadin . Takes Metoprolol 12.5mg daily.     

## 2013-11-08 NOTE — Progress Notes (Signed)
Patient ID: [name], female   DOB: [birth year], 80 y.o.   MRN: [id number]   Code Status: DNR  Allergies  Allergen Reactions  . Amiodarone   . Penicillins   . Amoxicillin   . Celecoxib   . Ciprofloxacin   . Codeine   . Diltiazem   . Lactobacillus [Acidophilus Lactobacillus]   . Metoprolol Succinate     All Beta-blockers  . Metronidazole Hcl   . Other     floricene dye netribudazike    Chief Complaint  Patient presents with  . Medical Managment of Chronic Issues    HPI: Patient is a 80 y.o. female seen in the SNF at [facility] today for evaluation of chronic medical conditions.  Problem List Items Addressed This Visit   Atrial fibrillation     Rate controlled, continue anticoagulation with Coumadin . Takes Metoprolol 12.5mg  daily.      Congestive heart failure, unspecified     Saw Dr. Rollene Fare 11/02/12 increased to Furosemide to 20mg  and 40mg  alternating dose for increased edema-clinically compensated in the past year, continue daily weight, compression hosiery. Only trace pedal edema seen.      Iron deficiency anemia, unspecified     Hgb 10.7 03/25/92, takes Folic acid daily, no active bleed.     Major depressive disorder, recurrent episode, severe, without mention of psychotic behavior     Managed with Cymbalta 30mg .     Pain in joint, shoulder region (Chronic)     Pain is controlled with Tylenol 500mg  bid and Norco 5/325 1/2 qhs    Unspecified essential hypertension - Primary (Chronic)     Controlled, takes Furosemide 40mg  and 20mg  alternating dose and Metoprolol 12.5mg  daily.      Unspecified hypothyroidism     Takes Levothyroxine 38mcg, last TSH 3.345 07/04/13         Review of Systems:  Review of Systems  Constitutional: Negative for fever, chills, weight loss, malaise/fatigue and diaphoresis.  HENT: Positive for hearing loss. Negative for congestion, ear discharge, ear pain and sore throat.   Eyes: Negative for blurred vision, pain,  discharge and redness.  Respiratory: Negative for cough, sputum production, shortness of breath and wheezing.   Cardiovascular: Positive for leg swelling and PND. Negative for chest pain, palpitations, orthopnea and claudication.  Gastrointestinal: Negative for nausea, vomiting, abdominal pain, diarrhea, constipation and blood in stool.  Genitourinary: Positive for frequency. Negative for dysuria, urgency and flank pain.  Musculoskeletal: Positive for joint pain. Negative for back pain and neck pain.       Decreased ROM of the R+L shoulders for over head movement.   Skin: Negative for itching and rash.  Neurological: Negative for tremors, sensory change, speech change, focal weakness, seizures, loss of consciousness, weakness and headaches.  Endo/Heme/Allergies: Negative for environmental allergies and polydipsia. Does not bruise/bleed easily.  Psychiatric/Behavioral: Positive for memory loss. Negative for hallucinations. The patient is not nervous/anxious and does not have insomnia.      Past Medical History  Diagnosis Date  . Anemia   . Arthritis   . CHF (congestive heart failure)   . Depression   . Hypertension   . Chronic kidney disease   . Thyroid disease    Medications: Patient's Medications  New Prescriptions   No medications on file  Previous Medications   ACETAMINOPHEN (TYLENOL) 325 MG TABLET    Take 325 mg by mouth every 6 (six) hours as needed.     BETA CAROTENE W/MINERALS (OCUVITE) TABLET  Take 1 tablet by mouth daily.     CALCIUM-VITAMIN D (OSCAL WITH D) 500-200 MG-UNIT PER TABLET    Take 1 tablet by mouth 2 (two) times daily.     DOCUSATE SODIUM (COLACE) 100 MG CAPSULE    Take 100 mg by mouth 2 (two) times daily.     DOXYCYCLINE (VIBRAMYCIN) 100 MG CAPSULE    Take 100 mg by mouth 2 (two) times daily.   DULOXETINE (CYMBALTA) 30 MG CAPSULE    Take 30 mg by mouth daily.     FERROUS SULFATE 325 (65 FE) MG TABLET    Take 325 mg by mouth 2 (two) times daily with a meal.      FOLIC ACID (FOLVITE) 1 MG TABLET    Take 1 mg by mouth daily.     FUROSEMIDE (LASIX) 20 MG TABLET    Take 20 mg by mouth daily. ONE DAY 20 NEXT DAY 40 AND ALTERNATE.   LEVOTHYROXINE (SYNTHROID, LEVOTHROID) 50 MCG TABLET    Take 50 mcg by mouth daily before breakfast.   MAGNESIUM OXIDE (MAG-OX) 400 MG TABLET    Take 400 mg by mouth daily.     METOPROLOL SUCCINATE (TOPROL-XL) 25 MG 24 HR TABLET    Take 25 mg by mouth daily. Taking 1/2 tab    MULTIPLE VITAMIN (MULTIVITAMIN) TABLET    Take 1 tablet by mouth daily.     POTASSIUM CHLORIDE (MICRO-K) 10 MEQ CR CAPSULE    Take 10 mEq by mouth daily. Takes 20 meq (2) 10 meq daily    SENNOSIDES-DOCUSATE SODIUM (SENOKOT-S) 8.6-50 MG TABLET    Take by mouth daily.     WARFARIN (COUMADIN) 4 MG TABLET    Take 6 mg by mouth daily. Taking 6 mg alt with 5mg   Modified Medications   No medications on file  Discontinued Medications   No medications on file     Physical Exam: Physical Exam  Constitutional: She is oriented to person, place, and time. She appears well-developed and well-nourished. No distress.  HENT:  Head: Normocephalic and atraumatic.  Eyes: Conjunctivae and EOM are normal. Pupils are equal, round, and reactive to light.  Neck: Normal range of motion. Neck supple. No JVD present. No thyromegaly present.  Cardiovascular: Normal rate and normal heart sounds.  Frequent extrasystoles are present.  Pulses:      Dorsalis pedis pulses are 2+ on the right side, and 1+ on the left side.  Pulmonary/Chest: She has no wheezes. She has no rales. She exhibits no tenderness.  Abdominal: Soft. Bowel sounds are normal.  Musculoskeletal: She exhibits edema (left knee) and tenderness.       Left shoulder: She exhibits decreased range of motion, tenderness, crepitus and pain.       Legs: Anterior distal femur 2x2cm firm nodule palpated, fixed, hard, non tender. Reduced ROM of the R+L shoulder for over head activities. Left wrist/left shoulder/left knee pain  on and off.   Lymphadenopathy:    She has no cervical adenopathy.  Neurological: She is alert and oriented to person, place, and time. She has normal reflexes. No cranial nerve deficit. She exhibits normal muscle tone. Coordination normal.  Skin: Skin is dry. No rash noted. She is not diaphoretic. No erythema (left knee).  Psychiatric: She has a normal mood and affect. Her behavior is normal. Judgment and thought content normal. Cognition and memory are impaired. She exhibits abnormal recent memory.    Filed Vitals:   11/08/13 1354  BP: 112/80  Pulse:  72  Temp: 98 F (36.7 C)  TempSrc: Tympanic  Resp: 16      Labs reviewed: Basic Metabolic Panel:  Recent Labs  02/21/13 03/10/13 07/04/13 08/18/13  NA 137 139 137 137  K 4.9 4.6 4.4 4.3  BUN 30* 29* 26* 18  CREATININE 1.9* 1.1 1.1 1.0  TSH 3.45  --  3.34  --    Liver Function Tests:  Recent Labs  11/15/12 02/21/13 07/04/13  AST 21 20 29   ALT 17 14 19   ALKPHOS 46 56 49   CBC:  Recent Labs  04/13/13 1150  07/12/13 07/14/13 08/18/13  WBC 21.6*  < > 37.3 35.4 13.9  HGB 12.3  < > 12.3 11.6* 10.7*  HCT 38.5  < > 36 34* 32*  MCV 95  --   --   --   --   PLT 115*  < > 124* 156 134*  < > = values in this interval not displayed. Past Procedures:  06/30/13 CXR no acute cardiopulmonary abnormalities can be identified.    Assessment/Plan Congestive heart failure, unspecified Saw Dr. Rollene Fare 11/02/12 increased to Furosemide to 20mg  and 40mg  alternating dose for increased edema-clinically compensated in the past year, continue daily weight, compression hosiery. Only trace pedal edema seen.    Iron deficiency anemia, unspecified Hgb 10.7 2/68/34, takes Folic acid daily, no active bleed.   Major depressive disorder, recurrent episode, severe, without mention of psychotic behavior Managed with Cymbalta 30mg .   Unspecified hypothyroidism Takes Levothyroxine 45mcg, last TSH 3.345 07/04/13    Unspecified essential  hypertension Controlled, takes Furosemide 40mg  and 20mg  alternating dose and Metoprolol 12.5mg  daily.    Pain in joint, shoulder region Pain is controlled with Tylenol 500mg  bid and Norco 5/325 1/2 qhs  Atrial fibrillation Rate controlled, continue anticoagulation with Coumadin . Takes Metoprolol 12.5mg  daily.      Family/ Staff Communication: observe the patient.   Goals of Care: SNF  Labs/tests ordered: none

## 2013-11-08 NOTE — Assessment & Plan Note (Signed)
Takes Levothyroxine 29mcg, last TSH 3.345 07/04/13

## 2013-11-08 NOTE — Assessment & Plan Note (Signed)
Managed with Cymbalta 30mg.  

## 2013-11-08 NOTE — Assessment & Plan Note (Signed)
Controlled, takes Furosemide 40mg and 20mg alternating dose and Metoprolol 12.5mg daily.     

## 2013-11-08 NOTE — Assessment & Plan Note (Signed)
Saw Dr. Weintraub 11/02/12 increased to Furosemide to 20mg and 40mg alternating dose for increased edema-clinically compensated in the past year, continue daily weight, compression hosiery. Only trace pedal edema seen.     

## 2013-11-10 LAB — PROTIME-INR: PROTIME: 26.2 s — AB (ref 10.0–13.8)

## 2013-11-10 LAB — POCT INR: INR: 2.5 — AB (ref 0.9–1.1)

## 2013-12-02 ENCOUNTER — Non-Acute Institutional Stay (SKILLED_NURSING_FACILITY): Payer: Medicare Other | Admitting: Nurse Practitioner

## 2013-12-02 ENCOUNTER — Encounter: Payer: Self-pay | Admitting: Nurse Practitioner

## 2013-12-02 DIAGNOSIS — I10 Essential (primary) hypertension: Secondary | ICD-10-CM

## 2013-12-02 DIAGNOSIS — F332 Major depressive disorder, recurrent severe without psychotic features: Secondary | ICD-10-CM

## 2013-12-02 DIAGNOSIS — I509 Heart failure, unspecified: Secondary | ICD-10-CM

## 2013-12-02 DIAGNOSIS — I4891 Unspecified atrial fibrillation: Secondary | ICD-10-CM

## 2013-12-02 DIAGNOSIS — E039 Hypothyroidism, unspecified: Secondary | ICD-10-CM

## 2013-12-02 DIAGNOSIS — M25519 Pain in unspecified shoulder: Secondary | ICD-10-CM

## 2013-12-02 DIAGNOSIS — Z7901 Long term (current) use of anticoagulants: Secondary | ICD-10-CM

## 2013-12-02 DIAGNOSIS — K5900 Constipation, unspecified: Secondary | ICD-10-CM

## 2013-12-02 DIAGNOSIS — Z5181 Encounter for therapeutic drug level monitoring: Secondary | ICD-10-CM | POA: Insufficient documentation

## 2013-12-02 NOTE — Assessment & Plan Note (Signed)
Therapeutic presently

## 2013-12-02 NOTE — Assessment & Plan Note (Signed)
Managed with Cymbalta 30mg.  

## 2013-12-02 NOTE — Assessment & Plan Note (Signed)
Pain is controlled with Tylenol 500mg bid and Norco 5/325 1/2 qhs.     

## 2013-12-02 NOTE — Assessment & Plan Note (Signed)
Rate controlled, continue anticoagulation with Coumadin . Takes Metoprolol 12.5mg daily.     

## 2013-12-02 NOTE — Progress Notes (Signed)
Patient ID: [name], female   DOB: 11-17-1914, 80 y.o.   MRN: [id number]   Code Status: DNR  Allergies  Allergen Reactions  . Amiodarone   . Penicillins   . Amoxicillin   . Celecoxib   . Ciprofloxacin   . Codeine   . Diltiazem   . Lactobacillus [Acidophilus Lactobacillus]   . Metoprolol Succinate     All Beta-blockers  . Metronidazole Hcl   . Other     floricene dye netribudazike    Chief Complaint  Patient presents with  . Medical Management of Chronic Issues    HPI: Patient is a 80 y.o. female seen in the SNF at [facility], [place] today for evaluation of chronic medical conditions.  Problem List Items Addressed This Visit   Anticoagulation goal of INR 2 to 3     Therapeutic presently    Atrial fibrillation     Rate controlled, continue anticoagulation with Coumadin . Takes Metoprolol 12.5mg  daily.       Congestive heart failure, unspecified - Primary     Saw Dr. Rollene Fare 11/02/12 increased to Furosemide to 20mg  and 40mg  alternating dose for increased edema-clinically compensated in the past year, continue daily weight, compression hosiery. Only trace pedal edema seen.      Major depressive disorder, recurrent episode, severe, without mention of psychotic behavior     Managed with Cymbalta 30mg .     Pain in joint, shoulder region (Chronic)     Pain is controlled with Tylenol 500mg  bid and Norco 5/325 1/2 qhs     Unspecified constipation     Takes Colcace bid--no problem     Unspecified essential hypertension (Chronic)     Controlled, takes Furosemide 40mg  and 20mg  alternating dose and Metoprolol 12.5mg  daily.       Unspecified hypothyroidism     Takes Levothyroxine 68mcg, last TSH 3.345 07/04/13         Review of Systems:  Review of Systems  Constitutional: Negative for fever, chills, weight loss, malaise/fatigue and diaphoresis.  HENT: Positive for hearing loss. Negative for congestion, ear discharge, ear pain and sore throat.   Eyes:  Negative for blurred vision, pain, discharge and redness.  Respiratory: Negative for cough, sputum production, shortness of breath and wheezing.   Cardiovascular: Positive for leg swelling and PND. Negative for chest pain, palpitations, orthopnea and claudication.  Gastrointestinal: Negative for nausea, vomiting, abdominal pain, diarrhea, constipation and blood in stool.  Genitourinary: Positive for frequency. Negative for dysuria, urgency and flank pain.  Musculoskeletal: Positive for joint pain. Negative for back pain and neck pain.       Decreased ROM of the R+L shoulders for over head movement.   Skin: Negative for itching and rash.  Neurological: Negative for tremors, sensory change, speech change, focal weakness, seizures, loss of consciousness, weakness and headaches.  Endo/Heme/Allergies: Negative for environmental allergies and polydipsia. Does not bruise/bleed easily.  Psychiatric/Behavioral: Positive for memory loss. Negative for hallucinations. The patient is not nervous/anxious and does not have insomnia.      Past Medical History  Diagnosis Date  . Anemia   . Arthritis   . CHF (congestive heart failure)   . Depression   . Hypertension   . Chronic kidney disease   . Thyroid disease    Medications: Patient's Medications  New Prescriptions   No medications on file  Previous Medications   ACETAMINOPHEN (TYLENOL) 325 MG TABLET    Take 325 mg by mouth every 6 (six) hours as needed.  BETA CAROTENE W/MINERALS (OCUVITE) TABLET    Take 1 tablet by mouth daily.     CALCIUM-VITAMIN D (OSCAL WITH D) 500-200 MG-UNIT PER TABLET    Take 1 tablet by mouth 2 (two) times daily.     DOCUSATE SODIUM (COLACE) 100 MG CAPSULE    Take 100 mg by mouth 2 (two) times daily.     DOXYCYCLINE (VIBRAMYCIN) 100 MG CAPSULE    Take 100 mg by mouth 2 (two) times daily.   DULOXETINE (CYMBALTA) 30 MG CAPSULE    Take 30 mg by mouth daily.     FERROUS SULFATE 325 (65 FE) MG TABLET    Take 325 mg by mouth  2 (two) times daily with a meal.    FOLIC ACID (FOLVITE) 1 MG TABLET    Take 1 mg by mouth daily.     FUROSEMIDE (LASIX) 20 MG TABLET    Take 20 mg by mouth daily. ONE DAY 20 NEXT DAY 40 AND ALTERNATE.   LEVOTHYROXINE (SYNTHROID, LEVOTHROID) 50 MCG TABLET    Take 50 mcg by mouth daily before breakfast.   MAGNESIUM OXIDE (MAG-OX) 400 MG TABLET    Take 400 mg by mouth daily.     METOPROLOL SUCCINATE (TOPROL-XL) 25 MG 24 HR TABLET    Take 25 mg by mouth daily. Taking 1/2 tab    MULTIPLE VITAMIN (MULTIVITAMIN) TABLET    Take 1 tablet by mouth daily.     POTASSIUM CHLORIDE (MICRO-K) 10 MEQ CR CAPSULE    Take 10 mEq by mouth daily. Takes 20 meq (2) 10 meq daily    SENNOSIDES-DOCUSATE SODIUM (SENOKOT-S) 8.6-50 MG TABLET    Take by mouth daily.     WARFARIN (COUMADIN) 4 MG TABLET    Take 6 mg by mouth daily. Taking 6 mg alt with 5mg   Modified Medications   No medications on file  Discontinued Medications   No medications on file     Physical Exam: Physical Exam  Constitutional: She is oriented to person, place, and time. She appears well-developed and well-nourished. No distress.  HENT:  Head: Normocephalic and atraumatic.  Eyes: Conjunctivae and EOM are normal. Pupils are equal, round, and reactive to light.  Neck: Normal range of motion. Neck supple. No JVD present. No thyromegaly present.  Cardiovascular: Normal rate and normal heart sounds.  Frequent extrasystoles are present.  Pulses:      Dorsalis pedis pulses are 2+ on the right side, and 1+ on the left side.  Pulmonary/Chest: She has no wheezes. She has no rales. She exhibits no tenderness.  Abdominal: Soft. Bowel sounds are normal.  Musculoskeletal: She exhibits edema (left knee) and tenderness.       Left shoulder: She exhibits decreased range of motion, tenderness, crepitus and pain.       Legs: Anterior distal femur 2x2cm firm nodule palpated, fixed, hard, non tender. Reduced ROM of the R+L shoulder for over head activities. Left  wrist/left shoulder/left knee pain on and off.   Lymphadenopathy:    She has no cervical adenopathy.  Neurological: She is alert and oriented to person, place, and time. She has normal reflexes. No cranial nerve deficit. She exhibits normal muscle tone. Coordination normal.  Skin: Skin is dry. No rash noted. She is not diaphoretic. No erythema (left knee).  Psychiatric: She has a normal mood and affect. Her behavior is normal. Judgment and thought content normal. Cognition and memory are impaired. She exhibits abnormal recent memory.    Filed Vitals:  12/02/13 1400  BP: 158/76  Pulse: 66  Temp: 97.7 F (36.5 C)  TempSrc: Tympanic  Resp: 18      Labs reviewed: Basic Metabolic Panel:  Recent Labs  02/21/13 03/10/13 07/04/13 08/18/13  NA 137 139 137 137  K 4.9 4.6 4.4 4.3  BUN 30* 29* 26* 18  CREATININE 1.9* 1.1 1.1 1.0  TSH 3.45  --  3.34  --    Liver Function Tests:  Recent Labs  02/21/13 07/04/13  AST 20 29  ALT 14 19  ALKPHOS 56 49   CBC:  Recent Labs  04/13/13 1150  07/12/13 07/14/13 08/18/13  WBC 21.6*  < > 37.3 35.4 13.9  HGB 12.3  < > 12.3 11.6* 10.7*  HCT 38.5  < > 36 34* 32*  MCV 95  --   --   --   --   PLT 115*  < > 124* 156 134*  < > = values in this interval not displayed. Past Procedures:  06/30/13 CXR no acute cardiopulmonary abnormalities can be identified.    Assessment/Plan Congestive heart failure, unspecified Saw Dr. Rollene Fare 11/02/12 increased to Furosemide to 20mg  and 40mg  alternating dose for increased edema-clinically compensated in the past year, continue daily weight, compression hosiery. Only trace pedal edema seen.    Unspecified hypothyroidism Takes Levothyroxine 47mcg, last TSH 3.345 07/04/13    Atrial fibrillation Rate controlled, continue anticoagulation with Coumadin . Takes Metoprolol 12.5mg  daily.     Unspecified essential hypertension Controlled, takes Furosemide 40mg  and 20mg  alternating dose and Metoprolol 12.5mg   daily.     Unspecified constipation Takes Colcace bid--no problem   Pain in joint, shoulder region Pain is controlled with Tylenol 500mg  bid and Norco 5/325 1/2 qhs   Major depressive disorder, recurrent episode, severe, without mention of psychotic behavior Managed with Cymbalta 30mg .   Anticoagulation goal of INR 2 to 3 Therapeutic presently    Family/ Staff Communication: observe the patient.   Goals of Care: SNF  Labs/tests ordered: none

## 2013-12-02 NOTE — Assessment & Plan Note (Signed)
Takes Colcace bid--no problem   

## 2013-12-02 NOTE — Assessment & Plan Note (Signed)
Saw Dr. Weintraub 11/02/12 increased to Furosemide to 20mg and 40mg alternating dose for increased edema-clinically compensated in the past year, continue daily weight, compression hosiery. Only trace pedal edema seen.     

## 2013-12-02 NOTE — Assessment & Plan Note (Signed)
Takes Levothyroxine 50mcg, last TSH 3.345 07/04/13   

## 2013-12-02 NOTE — Assessment & Plan Note (Signed)
Controlled, takes Furosemide 40mg and 20mg alternating dose and Metoprolol 12.5mg daily.     

## 2013-12-09 ENCOUNTER — Encounter: Payer: Self-pay | Admitting: *Deleted

## 2014-01-03 ENCOUNTER — Encounter: Payer: Self-pay | Admitting: Nurse Practitioner

## 2014-01-03 ENCOUNTER — Non-Acute Institutional Stay (SKILLED_NURSING_FACILITY): Payer: Medicare Other | Admitting: Nurse Practitioner

## 2014-01-03 DIAGNOSIS — I4891 Unspecified atrial fibrillation: Secondary | ICD-10-CM

## 2014-01-03 DIAGNOSIS — K5900 Constipation, unspecified: Secondary | ICD-10-CM

## 2014-01-03 DIAGNOSIS — M25519 Pain in unspecified shoulder: Secondary | ICD-10-CM

## 2014-01-03 DIAGNOSIS — E039 Hypothyroidism, unspecified: Secondary | ICD-10-CM

## 2014-01-03 DIAGNOSIS — F332 Major depressive disorder, recurrent severe without psychotic features: Secondary | ICD-10-CM

## 2014-01-03 DIAGNOSIS — I10 Essential (primary) hypertension: Secondary | ICD-10-CM

## 2014-01-03 DIAGNOSIS — I509 Heart failure, unspecified: Secondary | ICD-10-CM

## 2014-01-03 DIAGNOSIS — D509 Iron deficiency anemia, unspecified: Secondary | ICD-10-CM

## 2014-01-03 NOTE — Assessment & Plan Note (Signed)
Hgb 10.7 01/15/29, takes Folic acid daily, no active bleed. Update CBC

## 2014-01-03 NOTE — Progress Notes (Signed)
Patient ID: [name], female   DOB: 1915/02/23, 80 y.o.   MRN: [id number]   Code Status: DNR  Allergies  Allergen Reactions  . Amiodarone   . Penicillins   . Amoxicillin   . Celecoxib   . Ciprofloxacin   . Codeine   . Diltiazem   . Lactobacillus [Acidophilus Lactobacillus]   . Metoprolol Succinate     All Beta-blockers  . Metronidazole Hcl   . Other     floricene dye netribudazike    Chief Complaint  Patient presents with  . Medical Management of Chronic Issues    HPI: Patient is a 80 y.o. female seen in the SNF at [facility] today for evaluation of chronic medical conditions.  Problem List Items Addressed This Visit   Unspecified hypothyroidism - Primary     Takes Levothyroxine 68mcg, last TSH 3.345 07/04/13, update TSH      Unspecified essential hypertension (Chronic)     Controlled, takes Furosemide 40mg  and 20mg  alternating dose and Metoprolol 12.5mg  daily. Update CMP       Unspecified constipation     Takes Colcace bid--no problem      Pain in joint, shoulder region (Chronic)     Pain is controlled with Tylenol 500mg  bid and Norco 5/325 1/2 qhs      Major depressive disorder, recurrent episode, severe, without mention of psychotic behavior     Managed with Cymbalta 30mg .      Iron deficiency anemia, unspecified     Hgb 10.7 11/04/79, takes Folic acid daily, no active bleed. Update CBC     Congestive heart failure, unspecified     Saw Dr. Rollene Fare 11/02/12 increased to Furosemide to 20mg  and 40mg  alternating dose for increased edema-clinically compensated in the past year, continue daily weight, compression hosiery. Only trace pedal edema seen.       Atrial fibrillation     Rate controlled, continue anticoagulation with Coumadin . Takes Metoprolol 12.5mg  daily.           Review of Systems:  Review of Systems  Constitutional: Negative for fever, chills, weight loss, malaise/fatigue and diaphoresis.  HENT: Positive for hearing  loss. Negative for congestion, ear discharge, ear pain and sore throat.   Eyes: Negative for blurred vision, pain, discharge and redness.  Respiratory: Negative for cough, sputum production, shortness of breath and wheezing.   Cardiovascular: Positive for leg swelling and PND. Negative for chest pain, palpitations, orthopnea and claudication.  Gastrointestinal: Negative for nausea, vomiting, abdominal pain, diarrhea, constipation and blood in stool.  Genitourinary: Positive for frequency. Negative for dysuria, urgency and flank pain.  Musculoskeletal: Positive for joint pain. Negative for back pain and neck pain.       Decreased ROM of the R+L shoulders for over head movement.   Skin: Negative for itching and rash.  Neurological: Negative for tremors, sensory change, speech change, focal weakness, seizures, loss of consciousness, weakness and headaches.  Endo/Heme/Allergies: Negative for environmental allergies and polydipsia. Does not bruise/bleed easily.  Psychiatric/Behavioral: Positive for memory loss. Negative for hallucinations. The patient is not nervous/anxious and does not have insomnia.      Past Medical History  Diagnosis Date  . Anemia   . Arthritis   . CHF (congestive heart failure)   . Depression   . Hypertension   . Chronic kidney disease   . Thyroid disease    Medications: Patient's Medications  New Prescriptions   No medications on file  Previous Medications   ACETAMINOPHEN (TYLENOL)  325 MG TABLET    Take 325 mg by mouth every 6 (six) hours as needed.     ACETAMINOPHEN (TYLENOL) 500 MG TABLET    Take 1 caplet by mouth twice daily   BETA CAROTENE W/MINERALS (OCUVITE) TABLET    Take 1 tablet by mouth daily.     CALCIUM-VITAMIN D (OSCAL WITH D) 500-200 MG-UNIT PER TABLET    Take 1 tablet by mouth 2 (two) times daily.     CAMPHOR-EUCALYPTUS-MENTHOL (VICKS VAPORUB) 4.7-1.2-2.6 % OINT    Apply topically. Rub on chest as directed  (may keep at bedside)   DOCUSATE SODIUM  (COLACE) 100 MG CAPSULE    Take 100 mg by mouth 2 (two) times daily.     DOXYCYCLINE (VIBRAMYCIN) 100 MG CAPSULE    Take 100 mg by mouth 2 (two) times daily.   DULOXETINE (CYMBALTA) 30 MG CAPSULE    Take 30 mg by mouth daily.     FOLIC ACID (FOLVITE) 1 MG TABLET    Take 1 mg by mouth daily.     FUROSEMIDE (LASIX) 20 MG TABLET    Take 20 mg by mouth daily. ONE DAY 20 NEXT DAY 40 AND ALTERNATE.   HYDROCODONE-ACETAMINOPHEN (NORCO/VICODIN) 5-325 MG PER TABLET    Take 1/2 tablet by mouth at bedtime   LEVOTHYROXINE (SYNTHROID, LEVOTHROID) 50 MCG TABLET    Take 50 mcg by mouth daily before breakfast.   MAGNESIUM OXIDE (MAG-OX) 400 MG TABLET    Take 400 mg by mouth daily.     METOPROLOL SUCCINATE (TOPROL-XL) 25 MG 24 HR TABLET    Take 25 mg by mouth daily. Taking 1/2 tab    MULTIPLE VITAMIN (MULTIVITAMIN) TABLET    Take 1 tablet by mouth daily.     POTASSIUM CHLORIDE (MICRO-K) 10 MEQ CR CAPSULE    Take 10 mEq by mouth daily. Takes 20 meq (2) 10 meq daily    SENNOSIDES-DOCUSATE SODIUM (SENOKOT-S) 8.6-50 MG TABLET    Take by mouth daily.     WARFARIN (COUMADIN) 4 MG TABLET    Take 4 mg by mouth daily. Taking 4 mg alt with 5mg   Modified Medications   No medications on file  Discontinued Medications   No medications on file     Physical Exam: Physical Exam  Constitutional: She is oriented to person, place, and time. She appears well-developed and well-nourished. No distress.  HENT:  Head: Normocephalic and atraumatic.  Eyes: Conjunctivae and EOM are normal. Pupils are equal, round, and reactive to light.  Neck: Normal range of motion. Neck supple. No JVD present. No thyromegaly present.  Cardiovascular: Normal rate and normal heart sounds.  Frequent extrasystoles are present.  Pulses:      Dorsalis pedis pulses are 2+ on the right side, and 1+ on the left side.  Pulmonary/Chest: She has no wheezes. She has no rales. She exhibits no tenderness.  Abdominal: Soft. Bowel sounds are normal.    Musculoskeletal: She exhibits edema (left knee) and tenderness.       Left shoulder: She exhibits decreased range of motion, tenderness, crepitus and pain.       Legs: Anterior distal femur 2x2cm firm nodule palpated, fixed, hard, non tender. Reduced ROM of the R+L shoulder for over head activities. Left wrist/left shoulder/left knee pain on and off.   Lymphadenopathy:    She has no cervical adenopathy.  Neurological: She is alert and oriented to person, place, and time. She has normal reflexes. No cranial nerve deficit. She exhibits normal  muscle tone. Coordination normal.  Skin: Skin is dry. No rash noted. She is not diaphoretic. No erythema (left knee).  Psychiatric: She has a normal mood and affect. Her behavior is normal. Judgment and thought content normal. Cognition and memory are impaired. She exhibits abnormal recent memory.    Filed Vitals:   01/03/14 1335  BP: 98/63  Pulse: 67  Temp: 98.1 F (36.7 C)  TempSrc: Tympanic  Resp: 16      Labs reviewed: Basic Metabolic Panel:  Recent Labs  02/21/13 03/10/13 07/04/13 08/18/13  NA 137 139 137 137  K 4.9 4.6 4.4 4.3  BUN 30* 29* 26* 18  CREATININE 1.9* 1.1 1.1 1.0  TSH 3.45  --  3.34  --    Liver Function Tests:  Recent Labs  02/21/13 07/04/13  AST 20 29  ALT 14 19  ALKPHOS 56 49   CBC:  Recent Labs  04/13/13 1150  07/12/13 07/14/13 08/18/13  WBC 21.6*  < > 37.3 35.4 13.9  HGB 12.3  < > 12.3 11.6* 10.7*  HCT 38.5  < > 36 34* 32*  MCV 95  --   --   --   --   PLT 115*  < > 124* 156 134*  < > = values in this interval not displayed. Past Procedures:  06/30/13 CXR no acute cardiopulmonary abnormalities can be identified.    Assessment/Plan Unspecified hypothyroidism Takes Levothyroxine 12mcg, last TSH 3.345 07/04/13, update TSH    Unspecified essential hypertension Controlled, takes Furosemide 40mg  and 20mg  alternating dose and Metoprolol 12.5mg  daily. Update CMP     Atrial fibrillation Rate  controlled, continue anticoagulation with Coumadin . Takes Metoprolol 12.5mg  daily.      Congestive heart failure, unspecified Saw Dr. Rollene Fare 11/02/12 increased to Furosemide to 20mg  and 40mg  alternating dose for increased edema-clinically compensated in the past year, continue daily weight, compression hosiery. Only trace pedal edema seen.     Iron deficiency anemia, unspecified Hgb 10.7 6/65/99, takes Folic acid daily, no active bleed. Update CBC   Unspecified constipation Takes Colcace bid--no problem    Pain in joint, shoulder region Pain is controlled with Tylenol 500mg  bid and Norco 5/325 1/2 qhs    Major depressive disorder, recurrent episode, severe, without mention of psychotic behavior Managed with Cymbalta 30mg .      Family/ Staff Communication: observe the patient.   Goals of Care: SNF  Labs/tests ordered: CBC, CMP, TSH

## 2014-01-03 NOTE — Assessment & Plan Note (Signed)
Takes Colcace bid--no problem

## 2014-01-03 NOTE — Assessment & Plan Note (Signed)
Pain is controlled with Tylenol 500mg bid and Norco 5/325 1/2 qhs.     

## 2014-01-03 NOTE — Assessment & Plan Note (Signed)
Managed with Cymbalta 30mg .

## 2014-01-03 NOTE — Assessment & Plan Note (Signed)
Rate controlled, continue anticoagulation with Coumadin . Takes Metoprolol 12.5mg  daily.

## 2014-01-03 NOTE — Assessment & Plan Note (Addendum)
Takes Levothyroxine 40mcg, last TSH 3.345 07/04/13, update TSH

## 2014-01-03 NOTE — Assessment & Plan Note (Signed)
Saw Dr. Rollene Fare 11/02/12 increased to Furosemide to 20mg  and 40mg  alternating dose for increased edema-clinically compensated in the past year, continue daily weight, compression hosiery. Only trace pedal edema seen.

## 2014-01-03 NOTE — Assessment & Plan Note (Signed)
Controlled, takes Furosemide 40mg  and 20mg  alternating dose and Metoprolol 12.5mg  daily. Update CMP

## 2014-01-05 LAB — CBC AND DIFFERENTIAL
HCT: 34 % — AB (ref 36–46)
Hemoglobin: 11.3 g/dL — AB (ref 12.0–16.0)
Platelets: 98 10*3/uL — AB (ref 150–399)
WBC: 22.5 10*3/mL

## 2014-01-05 LAB — HEPATIC FUNCTION PANEL
ALK PHOS: 57 U/L (ref 25–125)
ALT: 15 U/L (ref 7–35)
AST: 20 U/L (ref 13–35)
Bilirubin, Total: 0.3 mg/dL

## 2014-01-05 LAB — BASIC METABOLIC PANEL
BUN: 39 mg/dL — AB (ref 4–21)
CREATININE: 1.2 mg/dL — AB (ref 0.5–1.1)
GLUCOSE: 109 mg/dL
Potassium: 4.5 mmol/L (ref 3.4–5.3)
Sodium: 138 mmol/L (ref 137–147)

## 2014-01-05 LAB — PROTIME-INR: Protime: 23.6 seconds — AB (ref 10.0–13.8)

## 2014-01-05 LAB — TSH: TSH: 4.14 u[IU]/mL (ref 0.41–5.90)

## 2014-01-05 LAB — POCT INR: INR: 2.2 — AB (ref 0.9–1.1)

## 2014-01-06 ENCOUNTER — Other Ambulatory Visit: Payer: Self-pay | Admitting: Nurse Practitioner

## 2014-01-06 DIAGNOSIS — E039 Hypothyroidism, unspecified: Secondary | ICD-10-CM

## 2014-01-06 DIAGNOSIS — D72829 Elevated white blood cell count, unspecified: Secondary | ICD-10-CM | POA: Insufficient documentation

## 2014-01-06 DIAGNOSIS — D509 Iron deficiency anemia, unspecified: Secondary | ICD-10-CM

## 2014-01-06 DIAGNOSIS — N182 Chronic kidney disease, stage 2 (mild): Secondary | ICD-10-CM

## 2014-01-09 LAB — CBC AND DIFFERENTIAL
HCT: 33 % — AB (ref 36–46)
Hemoglobin: 11 g/dL — AB (ref 12.0–16.0)
Platelets: 93 10*3/uL — AB (ref 150–399)
WBC: 22.1 10*3/mL

## 2014-01-10 ENCOUNTER — Other Ambulatory Visit: Payer: Self-pay | Admitting: Nurse Practitioner

## 2014-01-10 DIAGNOSIS — D72829 Elevated white blood cell count, unspecified: Secondary | ICD-10-CM

## 2014-01-16 LAB — CBC AND DIFFERENTIAL
HCT: 33 % — AB (ref 36–46)
HEMOGLOBIN: 11 g/dL — AB (ref 12.0–16.0)
PLATELETS: 100 10*3/uL — AB (ref 150–399)
WBC: 21.2 10*3/mL

## 2014-01-17 ENCOUNTER — Other Ambulatory Visit: Payer: Self-pay | Admitting: Nurse Practitioner

## 2014-02-03 ENCOUNTER — Non-Acute Institutional Stay (SKILLED_NURSING_FACILITY): Payer: Medicare Other | Admitting: Nurse Practitioner

## 2014-02-03 ENCOUNTER — Encounter: Payer: Self-pay | Admitting: Nurse Practitioner

## 2014-02-03 DIAGNOSIS — I4891 Unspecified atrial fibrillation: Secondary | ICD-10-CM

## 2014-02-03 DIAGNOSIS — I482 Chronic atrial fibrillation, unspecified: Secondary | ICD-10-CM

## 2014-02-03 DIAGNOSIS — I509 Heart failure, unspecified: Secondary | ICD-10-CM

## 2014-02-03 DIAGNOSIS — D509 Iron deficiency anemia, unspecified: Secondary | ICD-10-CM

## 2014-02-03 DIAGNOSIS — R509 Fever, unspecified: Secondary | ICD-10-CM

## 2014-02-03 DIAGNOSIS — K5900 Constipation, unspecified: Secondary | ICD-10-CM

## 2014-02-03 DIAGNOSIS — F332 Major depressive disorder, recurrent severe without psychotic features: Secondary | ICD-10-CM

## 2014-02-03 DIAGNOSIS — I10 Essential (primary) hypertension: Secondary | ICD-10-CM

## 2014-02-03 DIAGNOSIS — M25511 Pain in right shoulder: Secondary | ICD-10-CM

## 2014-02-03 DIAGNOSIS — E039 Hypothyroidism, unspecified: Secondary | ICD-10-CM

## 2014-02-03 DIAGNOSIS — M25519 Pain in unspecified shoulder: Secondary | ICD-10-CM

## 2014-02-03 NOTE — Assessment & Plan Note (Signed)
Hgb 10.7 08/18/13 and 11.3 02/22/19, tkes Folic acid daily, no active bleed. Update CBC, ferritin, iron.

## 2014-02-03 NOTE — Assessment & Plan Note (Signed)
Pain is controlled with Tylenol 500mg bid and Norco 5/325 1/2 qhs.     

## 2014-02-03 NOTE — Assessment & Plan Note (Signed)
Controlled, takes Furosemide 40mg  and 20mg  alternating dose and Metoprolol 12.5mg  daily.

## 2014-02-03 NOTE — Assessment & Plan Note (Signed)
Takes Colcace bid--no problem

## 2014-02-03 NOTE — Assessment & Plan Note (Signed)
Lower grade T 100 x1 day, vomited x1 with stomach content, denied cough or chest pain. No constipation or diarrhea. Will obtain UA C/S, CBC, and BMP in am. Tylenol prn for now.

## 2014-02-03 NOTE — Assessment & Plan Note (Signed)
Rate controlled, continue anticoagulation with Coumadin . Takes Metoprolol 12.5mg  daily.

## 2014-02-03 NOTE — Progress Notes (Signed)
Patient ID: [name], female   DOB: 18-Apr-1915, 80 y.o.   MRN: [id number]   Code Status: DNR  Allergies  Allergen Reactions  . Amiodarone   . Penicillins   . Amoxicillin   . Celecoxib   . Ciprofloxacin   . Codeine   . Diltiazem   . Lactobacillus [Acidophilus Lactobacillus]   . Metoprolol Succinate     All Beta-blockers  . Metronidazole Hcl   . Other     floricene dye netribudazike    Chief Complaint  Patient presents with  . Medical Management of Chronic Issues  . Acute Visit    fever, vomiting x1    HPI: Patient is a 80 y.o. female seen in the SNF at [facility] today for evaluation of chronic medical conditions.  Problem List Items Addressed This Visit   Unspecified essential hypertension (Chronic)     Controlled, takes Furosemide 40mg  and 20mg  alternating dose and Metoprolol 12.5mg  daily.        Pain in joint, shoulder region (Chronic)     Pain is controlled with Tylenol 500mg  bid and Norco 5/325 1/2 qhs     Unspecified hypothyroidism     Takes Levothyroxine 77mcg, last TSH 4.137 01/05/14    Iron deficiency anemia, unspecified     Hgb 10.7 08/18/13 and 11.3 03/14/28, tkes Folic acid daily, no active bleed. Update CBC, ferritin, iron.       Major depressive disorder, recurrent episode, severe, without mention of psychotic behavior     Managed with Cymbalta 30mg .      Atrial fibrillation     Rate controlled, continue anticoagulation with Coumadin . Takes Metoprolol 12.5mg  daily.       Congestive heart failure, unspecified     Saw Dr. Rollene Fare 11/02/12 increased to Furosemide to 20mg  and 40mg  alternating dose for increased edema-clinically compensated in the past year, continue daily weight, compression hosiery. Only trace pedal edema seen.        Unspecified constipation     Takes Colcace bid--no problem      Fever, unspecified - Primary     Lower grade T 100 x1 day, vomited x1 with stomach content, denied cough or chest pain. No  constipation or diarrhea. Will obtain UA C/S, CBC, and BMP in am. Tylenol prn for now.        Review of Systems:  Review of Systems  Constitutional: Positive for fever. Negative for chills, weight loss, malaise/fatigue and diaphoresis.       X1 day  HENT: Positive for hearing loss. Negative for congestion, ear discharge, ear pain and sore throat.   Eyes: Negative for blurred vision, pain, discharge and redness.  Respiratory: Negative for cough, sputum production, shortness of breath and wheezing.   Cardiovascular: Positive for leg swelling and PND. Negative for chest pain, palpitations, orthopnea and claudication.  Gastrointestinal: Positive for vomiting. Negative for nausea, abdominal pain, diarrhea, constipation and blood in stool.       X1 stomach content.   Genitourinary: Positive for frequency. Negative for dysuria, urgency and flank pain.  Musculoskeletal: Positive for joint pain. Negative for back pain and neck pain.       Decreased ROM of the R+L shoulders for over head movement.   Skin: Negative for itching and rash.  Neurological: Negative for tremors, sensory change, speech change, focal weakness, seizures, loss of consciousness, weakness and headaches.  Endo/Heme/Allergies: Negative for environmental allergies and polydipsia. Does not bruise/bleed easily.  Psychiatric/Behavioral: Positive for memory loss. Negative for hallucinations.  The patient is not nervous/anxious and does not have insomnia.      Past Medical History  Diagnosis Date  . Anemia   . Arthritis   . CHF (congestive heart failure)   . Depression   . Hypertension   . Chronic kidney disease   . Thyroid disease    Medications: Patient's Medications  New Prescriptions   No medications on file  Previous Medications   ACETAMINOPHEN (TYLENOL) 325 MG TABLET    Take 325 mg by mouth every 6 (six) hours as needed.     ACETAMINOPHEN (TYLENOL) 500 MG TABLET    Take 1 caplet by mouth twice daily   BETA CAROTENE  W/MINERALS (OCUVITE) TABLET    Take 1 tablet by mouth daily.     CALCIUM-VITAMIN D (OSCAL WITH D) 500-200 MG-UNIT PER TABLET    Take 1 tablet by mouth 2 (two) times daily.     CAMPHOR-EUCALYPTUS-MENTHOL (VICKS VAPORUB) 4.7-1.2-2.6 % OINT    Apply topically. Rub on chest as directed  (may keep at bedside)   DOCUSATE SODIUM (COLACE) 100 MG CAPSULE    Take 100 mg by mouth 2 (two) times daily.     DOXYCYCLINE (VIBRAMYCIN) 100 MG CAPSULE    Take 100 mg by mouth 2 (two) times daily.   DULOXETINE (CYMBALTA) 30 MG CAPSULE    Take 30 mg by mouth daily.     FOLIC ACID (FOLVITE) 1 MG TABLET    Take 1 mg by mouth daily.     FUROSEMIDE (LASIX) 20 MG TABLET    Take 20 mg by mouth daily. ONE DAY 20 NEXT DAY 40 AND ALTERNATE.   HYDROCODONE-ACETAMINOPHEN (NORCO/VICODIN) 5-325 MG PER TABLET    Take 1/2 tablet by mouth at bedtime   LEVOTHYROXINE (SYNTHROID, LEVOTHROID) 50 MCG TABLET    Take 50 mcg by mouth daily before breakfast.   MAGNESIUM OXIDE (MAG-OX) 400 MG TABLET    Take 400 mg by mouth daily.     METOPROLOL SUCCINATE (TOPROL-XL) 25 MG 24 HR TABLET    Take 25 mg by mouth daily. Taking 1/2 tab    MULTIPLE VITAMIN (MULTIVITAMIN) TABLET    Take 1 tablet by mouth daily.     POTASSIUM CHLORIDE (MICRO-K) 10 MEQ CR CAPSULE    Take 10 mEq by mouth daily. Takes 20 meq (2) 10 meq daily    SENNOSIDES-DOCUSATE SODIUM (SENOKOT-S) 8.6-50 MG TABLET    Take by mouth daily.     WARFARIN (COUMADIN) 4 MG TABLET    Take 4 mg by mouth daily. Taking 4 mg alt with 5mg   Modified Medications   No medications on file  Discontinued Medications   No medications on file     Physical Exam: Physical Exam  Constitutional: She is oriented to person, place, and time. She appears well-developed and well-nourished. No distress.  HENT:  Head: Normocephalic and atraumatic.  Eyes: Conjunctivae and EOM are normal. Pupils are equal, round, and reactive to light.  Neck: Normal range of motion. Neck supple. No JVD present. No thyromegaly  present.  Cardiovascular: Normal rate.  Frequent extrasystoles are present.  Murmur heard. Pulses:      Dorsalis pedis pulses are 2+ on the right side, and 1+ on the left side.  EM 2/6  Pulmonary/Chest: She has no wheezes. She has no rales. She exhibits no tenderness.  Abdominal: Soft. Bowel sounds are normal.  Musculoskeletal: She exhibits edema (left knee) and tenderness.       Left shoulder: She exhibits decreased range  of motion, tenderness, crepitus and pain.       Legs: Anterior distal femur 2x2cm firm nodule palpated, fixed, hard, non tender. Reduced ROM of the R+L shoulder for over head activities. Left wrist/left shoulder/left knee pain on and off.   Lymphadenopathy:    She has no cervical adenopathy.  Neurological: She is alert and oriented to person, place, and time. She has normal reflexes. No cranial nerve deficit. She exhibits normal muscle tone. Coordination normal.  Skin: Skin is dry. No rash noted. She is not diaphoretic. No erythema (left knee).  Psychiatric: She has a normal mood and affect. Her behavior is normal. Judgment and thought content normal. Cognition and memory are impaired. She exhibits abnormal recent memory.    Filed Vitals:   02/03/14 1629  BP: 121/72  Pulse: 67  Temp: 100 F (37.8 C)  TempSrc: Tympanic  Resp: 20      Labs reviewed: Basic Metabolic Panel:  Recent Labs  02/21/13  07/04/13 08/18/13 01/05/14  NA 137  < > 137 137 138  K 4.9  < > 4.4 4.3 4.5  BUN 30*  < > 26* 18 39*  CREATININE 1.9*  < > 1.1 1.0 1.2*  TSH 3.45  --  3.34  --  4.14  < > = values in this interval not displayed. Liver Function Tests:  Recent Labs  02/21/13 07/04/13 01/05/14  AST 20 29 20   ALT 14 19 15   ALKPHOS 56 49 57   CBC:  Recent Labs  04/13/13 1150  01/05/14 01/09/14 01/16/14  WBC 21.6*  < > 22.5 22.1 21.2  HGB 12.3  < > 11.3* 11.0* 11.0*  HCT 38.5  < > 34* 33* 33*  MCV 95  --   --   --   --   PLT 115*  < > 98* 93* 100*  < > = values in this  interval not displayed. Past Procedures:  06/30/13 CXR no acute cardiopulmonary abnormalities can be identified.    Assessment/Plan Fever, unspecified Lower grade T 100 x1 day, vomited x1 with stomach content, denied cough or chest pain. No constipation or diarrhea. Will obtain UA C/S, CBC, and BMP in am. Tylenol prn for now.   Congestive heart failure, unspecified Saw Dr. Rollene Fare 11/02/12 increased to Furosemide to 20mg  and 40mg  alternating dose for increased edema-clinically compensated in the past year, continue daily weight, compression hosiery. Only trace pedal edema seen.      Unspecified hypothyroidism Takes Levothyroxine 46mcg, last TSH 4.137 01/05/14  Atrial fibrillation Rate controlled, continue anticoagulation with Coumadin . Takes Metoprolol 12.5mg  daily.     Unspecified essential hypertension Controlled, takes Furosemide 40mg  and 20mg  alternating dose and Metoprolol 12.5mg  daily.      Unspecified constipation Takes Colcace bid--no problem    Major depressive disorder, recurrent episode, severe, without mention of psychotic behavior Managed with Cymbalta 30mg .    Pain in joint, shoulder region Pain is controlled with Tylenol 500mg  bid and Norco 5/325 1/2 qhs   Iron deficiency anemia, unspecified Hgb 10.7 08/18/13 and 11.3 08/23/49, tkes Folic acid daily, no active bleed. Update CBC, ferritin, iron.       Family/ Staff Communication: observe the patient.   Goals of Care: SNF  Labs/tests ordered: Cath UA C/S, CBC, BMP, ferritin, iron.

## 2014-02-03 NOTE — Assessment & Plan Note (Signed)
Saw Dr. Rollene Fare 11/02/12 increased to Furosemide to 20mg  and 40mg  alternating dose for increased edema-clinically compensated in the past year, continue daily weight, compression hosiery. Only trace pedal edema seen.

## 2014-02-03 NOTE — Assessment & Plan Note (Signed)
Managed with Cymbalta 30mg .

## 2014-02-03 NOTE — Assessment & Plan Note (Signed)
Takes Levothyroxine 50mcg, last TSH 4.137 01/05/14  

## 2014-02-04 LAB — BASIC METABOLIC PANEL
BUN: 26 mg/dL — AB (ref 4–21)
CREATININE: 1.2 mg/dL — AB (ref 0.5–1.1)
Glucose: 135 mg/dL
POTASSIUM: 4.1 mmol/L (ref 3.4–5.3)
Sodium: 135 mmol/L — AB (ref 137–147)

## 2014-02-04 LAB — CBC AND DIFFERENTIAL
HCT: 34 % — AB (ref 36–46)
HEMOGLOBIN: 11.9 g/dL — AB (ref 12.0–16.0)
Platelets: 99 10*3/uL — AB (ref 150–399)
WBC: 31.7 10^3/mL

## 2014-02-07 ENCOUNTER — Encounter: Payer: Self-pay | Admitting: Nurse Practitioner

## 2014-02-07 ENCOUNTER — Other Ambulatory Visit: Payer: Self-pay | Admitting: Nurse Practitioner

## 2014-02-10 ENCOUNTER — Non-Acute Institutional Stay (SKILLED_NURSING_FACILITY): Payer: Medicare Other | Admitting: Nurse Practitioner

## 2014-02-10 ENCOUNTER — Encounter: Payer: Self-pay | Admitting: Nurse Practitioner

## 2014-02-10 DIAGNOSIS — I4891 Unspecified atrial fibrillation: Secondary | ICD-10-CM

## 2014-02-10 DIAGNOSIS — R509 Fever, unspecified: Secondary | ICD-10-CM

## 2014-02-10 DIAGNOSIS — D509 Iron deficiency anemia, unspecified: Secondary | ICD-10-CM

## 2014-02-10 DIAGNOSIS — L03119 Cellulitis of unspecified part of limb: Secondary | ICD-10-CM

## 2014-02-10 DIAGNOSIS — F332 Major depressive disorder, recurrent severe without psychotic features: Secondary | ICD-10-CM

## 2014-02-10 DIAGNOSIS — N390 Urinary tract infection, site not specified: Secondary | ICD-10-CM

## 2014-02-10 DIAGNOSIS — L03116 Cellulitis of left lower limb: Secondary | ICD-10-CM | POA: Insufficient documentation

## 2014-02-10 DIAGNOSIS — I509 Heart failure, unspecified: Secondary | ICD-10-CM

## 2014-02-10 DIAGNOSIS — E039 Hypothyroidism, unspecified: Secondary | ICD-10-CM

## 2014-02-10 DIAGNOSIS — R4182 Altered mental status, unspecified: Secondary | ICD-10-CM | POA: Insufficient documentation

## 2014-02-10 DIAGNOSIS — R400 Somnolence: Secondary | ICD-10-CM

## 2014-02-10 DIAGNOSIS — L02419 Cutaneous abscess of limb, unspecified: Secondary | ICD-10-CM

## 2014-02-10 DIAGNOSIS — R404 Transient alteration of awareness: Secondary | ICD-10-CM

## 2014-02-10 DIAGNOSIS — I10 Essential (primary) hypertension: Secondary | ICD-10-CM

## 2014-02-10 NOTE — Assessment & Plan Note (Signed)
Sleeps more even if during meals, talking to herself, and slowed mentation noted  In the past 24 hours. UTI, left leg cellulitis vs flare up of infected left knee, ? Volume deplete are possible etiologies. Obtain CXR, X-ray L knee 3 views, CBC, CMP. Monitor I+O. Empirical ABT Rocephin 2gm IV daily x 10days.

## 2014-02-10 NOTE — Assessment & Plan Note (Signed)
Takes Levothyroxine 50mcg, last TSH 4.137 01/05/14  

## 2014-02-10 NOTE — Assessment & Plan Note (Addendum)
Hgb 10.7 08/18/13 and 11.3 08/30/31, takes Folic acid daily. 02/04/14 Iron 19, Hgb 11.9, normal TIBC 228 and ferritin 150--suggestive anemia of chronic disease.

## 2014-02-10 NOTE — Assessment & Plan Note (Signed)
Rate controlled, continue anticoagulation with Coumadin . Takes Metoprolol 12.5mg  daily. PT/INR 02/13/14.

## 2014-02-10 NOTE — Assessment & Plan Note (Signed)
Saw Dr. Rollene Fare 11/02/12 increased to Furosemide to 20mg  and 40mg  alternating dose for increased edema-clinically compensated in the past year, continue daily weight, compression hosiery. Only trace pedal edema seen.  May hold Lasix if CMP indicates volume deplete.

## 2014-02-10 NOTE — Assessment & Plan Note (Signed)
Lower grade T99s x 1 week, vomited x1 with stomach content 02/03/14, dtr reported pt;s cough. No constipation or diarrhea. Will obtain CBC, CMP, blood culture x3, CXR, and X-ray L knee 3 views. UTI and LLE cellulitis contributed to the problem. Will have Rocephin 2gm IV daily x 10days in addition to long term Doxycycline for the infected left knee to cover UTI and Cellulitis. Final urine culture pending. POA desires comfort measures and declined ED evaluation at time. Will have Morphine and Lorazepam pan available to her.

## 2014-02-10 NOTE — Assessment & Plan Note (Signed)
Managed with Cymbalta 30mg .

## 2014-02-10 NOTE — Progress Notes (Signed)
Patient ID: [name], female   DOB: 07-May-1915, 80 y.o.   MRN: [id number]   Code Status: DNR  Allergies  Allergen Reactions  . Amiodarone   . Penicillins   . Amoxicillin   . Celecoxib   . Ciprofloxacin   . Codeine   . Diltiazem   . Lactobacillus [Acidophilus Lactobacillus]   . Metoprolol Succinate     All Beta-blockers  . Metronidazole Hcl   . Other     floricene dye netribudazike    Chief Complaint  Patient presents with  . Medical Management of Chronic Issues  . Acute Visit    lethargy, low grade fever, UTI, cellulitis LLE    HPI: Patient is a 80 y.o. female seen in the SNF at [facility] today for evaluation of low grade T, UTI, AMS, LLE cellulitis, and chronic medical conditions.  Problem List Items Addressed This Visit   Unspecified essential hypertension (Chronic)     Controlled, may hold Furosemide 40mg  and 20mg  alternating dose-if CMP indicates volume deplete and continue Metoprolol 12.5mg  daily.      Unspecified hypothyroidism     Takes Levothyroxine 20mcg, last TSH 4.137 01/05/14     Iron deficiency anemia, unspecified     Hgb 10.7 08/18/13 and 11.3 9/93/71, takes Folic acid daily. 02/04/14 Iron 19, Hgb 11.9, normal TIBC 228 and ferritin 150--suggestive anemia of chronic disease.       Major depressive disorder, recurrent episode, severe, without mention of psychotic behavior     Managed with Cymbalta 30mg .     Atrial fibrillation     Rate controlled, continue anticoagulation with Coumadin . Takes Metoprolol 12.5mg  daily. PT/INR 02/13/14.        Congestive heart failure, unspecified     Saw Dr. Rollene Fare 11/02/12 increased to Furosemide to 20mg  and 40mg  alternating dose for increased edema-clinically compensated in the past year, continue daily weight, compression hosiery. Only trace pedal edema seen.  May hold Lasix if CMP indicates volume deplete.       Urinary tract infection, site not specified     02/10/14 UA 80,000 c/ml Gram Neg  Rods-Rocephin 2gm IV daily x 10days empirically.     Fever, unspecified - Primary     Lower grade T99s x 1 week, vomited x1 with stomach content 02/03/14, dtr reported pt;s cough. No constipation or diarrhea. Will obtain CBC, CMP, blood culture x3, CXR, and X-ray L knee 3 views. UTI and LLE cellulitis contributed to the problem. Will have Rocephin 2gm IV daily x 10days in addition to long term Doxycycline for the infected left knee to cover UTI and Cellulitis. Final urine culture pending. POA desires comfort measures and declined ED evaluation at time. Will have Morphine and Lorazepam pan available to her.      Cellulitis of leg, left     Warmth and redness lateral mid left thigh to ankle. Rocephin 2gm IV daily x 10days     Altered mental status     Sleeps more even if during meals, talking to herself, and slowed mentation noted  In the past 24 hours. UTI, left leg cellulitis vs flare up of infected left knee, ? Volume deplete are possible etiologies. Obtain CXR, X-ray L knee 3 views, CBC, CMP. Monitor I+O. Empirical ABT Rocephin 2gm IV daily x 10days.        Review of Systems:  Review of Systems  Constitutional: Positive for fever and malaise/fatigue. Negative for chills, weight loss and diaphoresis.  X1 day  HENT: Positive for hearing loss. Negative for congestion, ear discharge, ear pain and sore throat.   Eyes: Negative for blurred vision, pain, discharge and redness.  Respiratory: Negative for cough, sputum production, shortness of breath and wheezing.   Cardiovascular: Positive for leg swelling and PND. Negative for chest pain, palpitations, orthopnea and claudication.  Gastrointestinal: Negative for nausea, vomiting, abdominal pain, diarrhea, constipation and blood in stool.  Genitourinary: Positive for frequency. Negative for dysuria, urgency and flank pain.  Musculoskeletal: Positive for joint pain. Negative for back pain and neck pain.       Decreased ROM of the R+L shoulders  for over head movement.   Skin: Negative for itching and rash.       LLE lateral mid thigh to ankle warmth and erythema.   Neurological: Negative for tremors, sensory change, speech change, focal weakness, seizures, loss of consciousness, weakness and headaches.  Endo/Heme/Allergies: Negative for environmental allergies and polydipsia. Does not bruise/bleed easily.  Psychiatric/Behavioral: Positive for memory loss. Negative for hallucinations. The patient is not nervous/anxious and does not have insomnia.      Past Medical History  Diagnosis Date  . Anemia   . Arthritis   . CHF (congestive heart failure)   . Depression   . Hypertension   . Chronic kidney disease   . Thyroid disease    Medications: Patient's Medications  New Prescriptions   No medications on file  Previous Medications   ACETAMINOPHEN (TYLENOL) 325 MG TABLET    Take 325 mg by mouth every 6 (six) hours as needed.     ACETAMINOPHEN (TYLENOL) 500 MG TABLET    Take 1 caplet by mouth twice daily   BETA CAROTENE W/MINERALS (OCUVITE) TABLET    Take 1 tablet by mouth daily.     CALCIUM-VITAMIN D (OSCAL WITH D) 500-200 MG-UNIT PER TABLET    Take 1 tablet by mouth 2 (two) times daily.     CAMPHOR-EUCALYPTUS-MENTHOL (VICKS VAPORUB) 4.7-1.2-2.6 % OINT    Apply topically. Rub on chest as directed  (may keep at bedside)   DOCUSATE SODIUM (COLACE) 100 MG CAPSULE    Take 100 mg by mouth 2 (two) times daily.     DOXYCYCLINE (VIBRAMYCIN) 100 MG CAPSULE    Take 100 mg by mouth 2 (two) times daily.   DULOXETINE (CYMBALTA) 30 MG CAPSULE    Take 30 mg by mouth daily.     FOLIC ACID (FOLVITE) 1 MG TABLET    Take 1 mg by mouth daily.     FUROSEMIDE (LASIX) 20 MG TABLET    Take 20 mg by mouth daily. ONE DAY 20 NEXT DAY 40 AND ALTERNATE.   HYDROCODONE-ACETAMINOPHEN (NORCO/VICODIN) 5-325 MG PER TABLET    Take 1/2 tablet by mouth at bedtime   LEVOTHYROXINE (SYNTHROID, LEVOTHROID) 50 MCG TABLET    Take 50 mcg by mouth daily before breakfast.    MAGNESIUM OXIDE (MAG-OX) 400 MG TABLET    Take 400 mg by mouth daily.     METOPROLOL SUCCINATE (TOPROL-XL) 25 MG 24 HR TABLET    Take 25 mg by mouth daily. Taking 1/2 tab    MULTIPLE VITAMIN (MULTIVITAMIN) TABLET    Take 1 tablet by mouth daily.     POTASSIUM CHLORIDE (MICRO-K) 10 MEQ CR CAPSULE    Take 10 mEq by mouth daily. Takes 20 meq (2) 10 meq daily    SENNOSIDES-DOCUSATE SODIUM (SENOKOT-S) 8.6-50 MG TABLET    Take by mouth daily.     WARFARIN (  COUMADIN) 4 MG TABLET    Take 4 mg by mouth daily. Taking 4 mg alt with 5mg   Modified Medications   No medications on file  Discontinued Medications   No medications on file     Physical Exam: Physical Exam  Constitutional: She is oriented to person, place, and time. She appears well-developed and well-nourished. No distress.  HENT:  Head: Normocephalic and atraumatic.  Eyes: Conjunctivae and EOM are normal. Pupils are equal, round, and reactive to light.  Neck: Normal range of motion. Neck supple. No JVD present. No thyromegaly present.  Cardiovascular: Normal rate.  Frequent extrasystoles are present.  Murmur heard. Pulses:      Dorsalis pedis pulses are 2+ on the right side, and 1+ on the left side.  EM 2/6  Pulmonary/Chest: She has no wheezes. She has no rales. She exhibits no tenderness.  Abdominal: Soft. Bowel sounds are normal.  Musculoskeletal: She exhibits edema (left knee) and tenderness.       Left shoulder: She exhibits decreased range of motion, tenderness, crepitus and pain.       Legs: Anterior distal femur 2x2cm firm nodule palpated, fixed, hard, non tender. Reduced ROM of the R+L shoulder for over head activities. Left wrist/left shoulder/left knee pain on and off.   Lymphadenopathy:    She has no cervical adenopathy.  Neurological: She is alert and oriented to person, place, and time. She has normal reflexes. No cranial nerve deficit. She exhibits normal muscle tone. Coordination normal.  Skin: Skin is dry. No rash  noted. She is not diaphoretic. There is erythema (left knee).  LLE lateral mid thigh to ankle warmth and erythema. Pigmented BLE   Psychiatric: She has a normal mood and affect. Her behavior is normal. Judgment and thought content normal. Cognition and memory are impaired. She exhibits abnormal recent memory.    Filed Vitals:   02/10/14 1126  BP: 130/70  Pulse: 80  Temp: 99.8 F (37.7 C)  TempSrc: Tympanic  Resp: 24      Labs reviewed: Basic Metabolic Panel:  Recent Labs  02/21/13  07/04/13 08/18/13 01/05/14 02/04/14  NA 137  < > 137 137 138 135*  K 4.9  < > 4.4 4.3 4.5 4.1  BUN 30*  < > 26* 18 39* 26*  CREATININE 1.9*  < > 1.1 1.0 1.2* 1.2*  TSH 3.45  --  3.34  --  4.14  --   < > = values in this interval not displayed. Liver Function Tests:  Recent Labs  02/21/13 07/04/13 01/05/14  AST 20 29 20   ALT 14 19 15   ALKPHOS 56 49 57   CBC:  Recent Labs  04/13/13 1150  01/09/14 01/16/14 02/04/14  WBC 21.6*  < > 22.1 21.2 31.7  HGB 12.3  < > 11.0* 11.0* 11.9*  HCT 38.5  < > 33* 33* 34*  MCV 95  --   --   --   --   PLT 115*  < > 93* 100* 99*  < > = values in this interval not displayed. Past Procedures:  06/30/13 CXR no acute cardiopulmonary abnormalities can be identified.    Assessment/Plan Fever, unspecified Lower grade T99s x 1 week, vomited x1 with stomach content 02/03/14, dtr reported pt;s cough. No constipation or diarrhea. Will obtain CBC, CMP, blood culture x3, CXR, and X-ray L knee 3 views. UTI and LLE cellulitis contributed to the problem. Will have Rocephin 2gm IV daily x 10days in addition to long term Doxycycline for  the infected left knee to cover UTI and Cellulitis. Final urine culture pending. POA desires comfort measures and declined ED evaluation at time. Will have Morphine and Lorazepam pan available to her.    Urinary tract infection, site not specified 02/10/14 UA 80,000 c/ml Gram Neg Rods-Rocephin 2gm IV daily x 10days empirically.   Atrial  fibrillation Rate controlled, continue anticoagulation with Coumadin . Takes Metoprolol 12.5mg  daily. PT/INR 02/13/14.      Unspecified essential hypertension Controlled, may hold Furosemide 40mg  and 20mg  alternating dose-if CMP indicates volume deplete and continue Metoprolol 12.5mg  daily.    Unspecified hypothyroidism Takes Levothyroxine 37mcg, last TSH 4.137 01/05/14   Iron deficiency anemia, unspecified Hgb 10.7 08/18/13 and 11.3 1/97/58, takes Folic acid daily. 02/04/14 Iron 19, Hgb 11.9, normal TIBC 228 and ferritin 150--suggestive anemia of chronic disease.     Major depressive disorder, recurrent episode, severe, without mention of psychotic behavior Managed with Cymbalta 30mg .   Congestive heart failure, unspecified Saw Dr. Rollene Fare 11/02/12 increased to Furosemide to 20mg  and 40mg  alternating dose for increased edema-clinically compensated in the past year, continue daily weight, compression hosiery. Only trace pedal edema seen.  May hold Lasix if CMP indicates volume deplete.     Cellulitis of leg, left Warmth and redness lateral mid left thigh to ankle. Rocephin 2gm IV daily x 10days   Altered mental status Sleeps more even if during meals, talking to herself, and slowed mentation noted  In the past 24 hours. UTI, left leg cellulitis vs flare up of infected left knee, ? Volume deplete are possible etiologies. Obtain CXR, X-ray L knee 3 views, CBC, CMP. Monitor I+O. Empirical ABT Rocephin 2gm IV daily x 10days.     Family/ Staff Communication: observe the patient.   Goals of Care: SNF  Labs/tests ordered: CBC, CMP, CXR, X-ray L knee 3 views, blood culture x3.

## 2014-02-10 NOTE — Assessment & Plan Note (Signed)
Warmth and redness lateral mid left thigh to ankle. Rocephin 2gm IV daily x 10days

## 2014-02-10 NOTE — Assessment & Plan Note (Signed)
02/10/14 UA 80,000 c/ml Gram Neg Rods-Rocephin 2gm IV daily x 10days empirically.

## 2014-02-10 NOTE — Assessment & Plan Note (Signed)
Controlled, may hold Furosemide 40mg  and 20mg  alternating dose-if CMP indicates volume deplete and continue Metoprolol 12.5mg  daily.

## 2014-02-13 LAB — BASIC METABOLIC PANEL
BUN: 24 mg/dL — AB (ref 4–21)
Creatinine: 1.2 mg/dL — AB (ref 0.5–1.1)
Glucose: 105 mg/dL
Potassium: 4.9 mmol/L (ref 3.4–5.3)
SODIUM: 134 mmol/L — AB (ref 137–147)

## 2014-02-13 LAB — HEPATIC FUNCTION PANEL
ALK PHOS: 60 U/L (ref 25–125)
ALT: 12 U/L (ref 7–35)
AST: 17 U/L (ref 13–35)
BILIRUBIN, TOTAL: 0.4 mg/dL

## 2014-02-13 LAB — CBC AND DIFFERENTIAL
HEMATOCRIT: 33 % — AB (ref 36–46)
Hemoglobin: 11.3 g/dL — AB (ref 12.0–16.0)
Platelets: 130 10*3/uL — AB (ref 150–399)
WBC: 29.3 10^3/mL

## 2014-02-14 ENCOUNTER — Encounter: Payer: Self-pay | Admitting: Nurse Practitioner

## 2014-02-14 ENCOUNTER — Non-Acute Institutional Stay (SKILLED_NURSING_FACILITY): Payer: Medicare Other | Admitting: Nurse Practitioner

## 2014-02-14 DIAGNOSIS — I509 Heart failure, unspecified: Secondary | ICD-10-CM

## 2014-02-14 DIAGNOSIS — I4891 Unspecified atrial fibrillation: Secondary | ICD-10-CM

## 2014-02-14 DIAGNOSIS — L02419 Cutaneous abscess of limb, unspecified: Secondary | ICD-10-CM

## 2014-02-14 DIAGNOSIS — E039 Hypothyroidism, unspecified: Secondary | ICD-10-CM

## 2014-02-14 DIAGNOSIS — R509 Fever, unspecified: Secondary | ICD-10-CM

## 2014-02-14 DIAGNOSIS — J189 Pneumonia, unspecified organism: Secondary | ICD-10-CM

## 2014-02-14 DIAGNOSIS — A412 Sepsis due to unspecified staphylococcus: Secondary | ICD-10-CM

## 2014-02-14 DIAGNOSIS — L03116 Cellulitis of left lower limb: Secondary | ICD-10-CM

## 2014-02-14 DIAGNOSIS — Z5181 Encounter for therapeutic drug level monitoring: Secondary | ICD-10-CM

## 2014-02-14 DIAGNOSIS — I10 Essential (primary) hypertension: Secondary | ICD-10-CM

## 2014-02-14 DIAGNOSIS — N390 Urinary tract infection, site not specified: Secondary | ICD-10-CM

## 2014-02-14 DIAGNOSIS — Z7901 Long term (current) use of anticoagulants: Secondary | ICD-10-CM

## 2014-02-14 DIAGNOSIS — J181 Lobar pneumonia, unspecified organism: Secondary | ICD-10-CM

## 2014-02-14 DIAGNOSIS — L03119 Cellulitis of unspecified part of limb: Secondary | ICD-10-CM

## 2014-02-14 NOTE — Assessment & Plan Note (Signed)
Takes Levothyroxine 50mcg, last TSH 4.137 01/05/14  

## 2014-02-14 NOTE — Assessment & Plan Note (Signed)
Will check PT/INR 02/14/14

## 2014-02-14 NOTE — Assessment & Plan Note (Signed)
Controlled, may hold Furosemide 40mg  and 20mg  and continue Metoprolol 12.5mg  daily.

## 2014-02-14 NOTE — Assessment & Plan Note (Signed)
Rate controlled, continue anticoagulation with Coumadin . Takes Metoprolol 12.5mg  daily. PT/INR 02/14/14.

## 2014-02-14 NOTE — Assessment & Plan Note (Signed)
Resolved

## 2014-02-14 NOTE — Assessment & Plan Note (Signed)
02/13/14 blood culture Staphylococcus species susceptible to Gentamicin, Vancomycin, and Rifampin. Rifampin and Gentamicin should not be used as single drugs for treatment of staph infection per Lab. Will consider PICC and Vancomycin if POA consents.

## 2014-02-14 NOTE — Assessment & Plan Note (Signed)
Hold Furosemide due to decreased oral intake, continue I+O, encourage oral fluid intake, monitor for s/s decompensation of CHF.

## 2014-02-14 NOTE — Assessment & Plan Note (Signed)
Warmth and redness lateral mid left thigh to ankle-resolved. Dc Rocephin 2gm IV daiy

## 2014-02-14 NOTE — Assessment & Plan Note (Signed)
02/10/14 CXR patchy infiltrate right lower lung consistent with pneumonia, mild cardiomegaly with borderline pulmonary vasculature. Switched from Rocephin to Gentamicin per blood culture 02/13/14. Fever is resolved today. No O2 desaturation.

## 2014-02-14 NOTE — Assessment & Plan Note (Signed)
02/10/14 UA 80,000 c/ml Gram Neg Rods-Rocephin 2gm IV daily x 10days

## 2014-02-14 NOTE — Progress Notes (Signed)
Patient ID: [name], female   DOB: [birth year], 80 y.o.   MRN: [id number]   Code Status: DNR  Allergies  Allergen Reactions  . Amiodarone   . Penicillins   . Amoxicillin   . Celecoxib   . Ciprofloxacin   . Codeine   . Diltiazem   . Lactobacillus [Acidophilus Lactobacillus]   . Metoprolol Succinate     All Beta-blockers  . Metronidazole Hcl   . Other     floricene dye netribudazike    Chief Complaint  Patient presents with  . Medical Management of Chronic Issues  . Acute Visit    PNA    HPI: Patient is a 80 y.o. female seen in the SNF at [facility] today for evaluation of PNA, sepsis, UTI, LLE cellulitis, and chronic medical conditions.  Problem List Items Addressed This Visit   Unspecified essential hypertension (Chronic)     Controlled, may hold Furosemide 40mg  and 20mg  and continue Metoprolol 12.5mg  daily.       Unspecified hypothyroidism     Takes Levothyroxine 14mcg, last TSH 4.137 01/05/14      Atrial fibrillation     Rate controlled, continue anticoagulation with Coumadin . Takes Metoprolol 12.5mg  daily. PT/INR 02/14/14.      Congestive heart failure, unspecified     Hold Furosemide due to decreased oral intake, continue I+O, encourage oral fluid intake, monitor for s/s decompensation of CHF.     Urinary tract infection, site not specified     02/10/14 UA 80,000 c/ml Gram Neg Rods-Rocephin 2gm IV daily x 10days     Anticoagulation goal of INR 2 to 3     Will check PT/INR 02/14/14    Fever, unspecified     Resolved     Cellulitis of leg, left     Warmth and redness lateral mid left thigh to ankle-resolved. Dc Rocephin 2gm IV daiy     Right lower lobe pneumonia     02/10/14 CXR patchy infiltrate right lower lung consistent with pneumonia, mild cardiomegaly with borderline pulmonary vasculature. Switched from Rocephin to Gentamicin per blood culture 02/13/14. Fever is resolved today. No O2 desaturation.      Staphylococcus septicemia -  Primary     02/13/14 blood culture Staphylococcus species susceptible to Gentamicin, Vancomycin, and Rifampin. Rifampin and Gentamicin should not be used as single drugs for treatment of staph infection per Lab. Will consider PICC and Vancomycin if POA consents.        Review of Systems:  Review of Systems  Constitutional: Positive for fever and malaise/fatigue. Negative for chills, weight loss and diaphoresis.       X1 day  HENT: Positive for hearing loss. Negative for congestion, ear discharge, ear pain and sore throat.   Eyes: Negative for blurred vision, pain, discharge and redness.  Respiratory: Negative for cough, sputum production, shortness of breath and wheezing.   Cardiovascular: Positive for leg swelling and PND. Negative for chest pain, palpitations, orthopnea and claudication.  Gastrointestinal: Negative for nausea, vomiting, abdominal pain, diarrhea, constipation and blood in stool.  Genitourinary: Positive for frequency. Negative for dysuria, urgency and flank pain.  Musculoskeletal: Positive for joint pain. Negative for back pain and neck pain.       Decreased ROM of the R+L shoulders for over head movement.   Skin: Negative for itching and rash.       LLE lateral mid thigh to ankle warmth and erythema.   Neurological: Negative for tremors, sensory change, speech change, focal  weakness, seizures, loss of consciousness, weakness and headaches.  Endo/Heme/Allergies: Negative for environmental allergies and polydipsia. Does not bruise/bleed easily.  Psychiatric/Behavioral: Positive for memory loss. Negative for hallucinations. The patient is not nervous/anxious and does not have insomnia.      Past Medical History  Diagnosis Date  . Anemia   . Arthritis   . CHF (congestive heart failure)   . Depression   . Hypertension   . Chronic kidney disease   . Thyroid disease    Medications: Patient's Medications  New Prescriptions   No medications on file  Previous  Medications   ACETAMINOPHEN (TYLENOL) 325 MG TABLET    Take 325 mg by mouth every 6 (six) hours as needed.     ACETAMINOPHEN (TYLENOL) 500 MG TABLET    Take 1 caplet by mouth twice daily   BETA CAROTENE W/MINERALS (OCUVITE) TABLET    Take 1 tablet by mouth daily.     CALCIUM-VITAMIN D (OSCAL WITH D) 500-200 MG-UNIT PER TABLET    Take 1 tablet by mouth 2 (two) times daily.     CAMPHOR-EUCALYPTUS-MENTHOL (VICKS VAPORUB) 4.7-1.2-2.6 % OINT    Apply topically. Rub on chest as directed  (may keep at bedside)   DOCUSATE SODIUM (COLACE) 100 MG CAPSULE    Take 100 mg by mouth 2 (two) times daily.     DOXYCYCLINE (VIBRAMYCIN) 100 MG CAPSULE    Take 100 mg by mouth 2 (two) times daily.   DULOXETINE (CYMBALTA) 30 MG CAPSULE    Take 30 mg by mouth daily.     FOLIC ACID (FOLVITE) 1 MG TABLET    Take 1 mg by mouth daily.     FUROSEMIDE (LASIX) 20 MG TABLET    Take 20 mg by mouth daily. ONE DAY 20 NEXT DAY 40 AND ALTERNATE.   HYDROCODONE-ACETAMINOPHEN (NORCO/VICODIN) 5-325 MG PER TABLET    Take 1/2 tablet by mouth at bedtime   LEVOTHYROXINE (SYNTHROID, LEVOTHROID) 50 MCG TABLET    Take 50 mcg by mouth daily before breakfast.   MAGNESIUM OXIDE (MAG-OX) 400 MG TABLET    Take 400 mg by mouth daily.     METOPROLOL SUCCINATE (TOPROL-XL) 25 MG 24 HR TABLET    Take 25 mg by mouth daily. Taking 1/2 tab    MULTIPLE VITAMIN (MULTIVITAMIN) TABLET    Take 1 tablet by mouth daily.     POTASSIUM CHLORIDE (MICRO-K) 10 MEQ CR CAPSULE    Take 10 mEq by mouth daily. Takes 20 meq (2) 10 meq daily    SENNOSIDES-DOCUSATE SODIUM (SENOKOT-S) 8.6-50 MG TABLET    Take by mouth daily.     WARFARIN (COUMADIN) 4 MG TABLET    Take 4 mg by mouth daily. Taking 4 mg alt with 5mg   Modified Medications   No medications on file  Discontinued Medications   No medications on file     Physical Exam: Physical Exam  Constitutional: She is oriented to person, place, and time. She appears well-developed and well-nourished. No distress.  HENT:    Head: Normocephalic and atraumatic.  Eyes: Conjunctivae and EOM are normal. Pupils are equal, round, and reactive to light.  Neck: Normal range of motion. Neck supple. No JVD present. No thyromegaly present.  Cardiovascular: Normal rate.  Frequent extrasystoles are present.  Murmur heard. Pulses:      Dorsalis pedis pulses are 2+ on the right side, and 1+ on the left side.  EM 2/6  Pulmonary/Chest: She has wheezes. She has no rales. She exhibits no  tenderness.  Occasional expiratory wheezes at the posterior lower lungs.   Abdominal: Soft. Bowel sounds are normal.  Musculoskeletal: She exhibits edema (left knee) and tenderness.       Left shoulder: She exhibits decreased range of motion, tenderness, crepitus and pain.       Legs: Anterior distal femur 2x2cm firm nodule palpated, fixed, hard, non tender. Reduced ROM of the R+L shoulder for over head activities. Left wrist/left shoulder/left knee pain on and off.   Lymphadenopathy:    She has no cervical adenopathy.  Neurological: She is alert and oriented to person, place, and time. She has normal reflexes. No cranial nerve deficit. She exhibits normal muscle tone. Coordination normal.  Skin: Skin is dry. No rash noted. She is not diaphoretic. There is erythema (left knee).  LLE lateral mid thigh to ankle warmth and erythema. Pigmented BLE   Psychiatric: She has a normal mood and affect. Her behavior is normal. Judgment and thought content normal. Cognition and memory are impaired. She exhibits abnormal recent memory.    Filed Vitals:   02/14/14 1214  BP: 144/66  Pulse: 74  Temp: 98.1 F (36.7 C)  TempSrc: Tympanic  Resp: 20      Labs reviewed: Basic Metabolic Panel:  Recent Labs  02/21/13  07/04/13  01/05/14 02/04/14 02/13/14  NA 137  < > 137  < > 138 135* 134*  K 4.9  < > 4.4  < > 4.5 4.1 4.9  BUN 30*  < > 26*  < > 39* 26* 24*  CREATININE 1.9*  < > 1.1  < > 1.2* 1.2* 1.2*  TSH 3.45  --  3.34  --  4.14  --   --   < > =  values in this interval not displayed. Liver Function Tests:  Recent Labs  07/04/13 01/05/14 02/13/14  AST 29 20 17   ALT 19 15 12   ALKPHOS 49 57 60   CBC:  Recent Labs  04/13/13 1150  01/16/14 02/04/14 02/13/14  WBC 21.6*  < > 21.2 31.7 29.3  HGB 12.3  < > 11.0* 11.9* 11.3*  HCT 38.5  < > 33* 34* 33*  MCV 95  --   --   --   --   PLT 115*  < > 100* 99* 130*  < > = values in this interval not displayed. Past Procedures:  06/30/13 CXR no acute cardiopulmonary abnormalities can be identified.   02/10/14 CXR patchy infiltrate right lower lung consistent with pneumonia, mild cardiomegaly with borderline pulmonary vasculature.   02/10/14 X-ray L knee: total left knee prosthesis with no definite complication, fractures, or dislocation.    Assessment/Plan Right lower lobe pneumonia 02/10/14 CXR patchy infiltrate right lower lung consistent with pneumonia, mild cardiomegaly with borderline pulmonary vasculature. Switched from Rocephin to Gentamicin per blood culture 02/13/14. Fever is resolved today. No O2 desaturation.    Anticoagulation goal of INR 2 to 3 Will check PT/INR 02/14/14  Fever, unspecified Resolved   Cellulitis of leg, left Warmth and redness lateral mid left thigh to ankle-resolved. Dc Rocephin 2gm IV daiy   Urinary tract infection, site not specified 02/10/14 UA 80,000 c/ml Gram Neg Rods-Rocephin 2gm IV daily x 10days   Staphylococcus septicemia 02/13/14 blood culture Staphylococcus species susceptible to Gentamicin, Vancomycin, and Rifampin. Rifampin and Gentamicin should not be used as single drugs for treatment of staph infection per Lab. Will consider PICC and Vancomycin if POA consents.   Congestive heart failure, unspecified Hold Furosemide due to decreased  oral intake, continue I+O, encourage oral fluid intake, monitor for s/s decompensation of CHF.   Unspecified essential hypertension Controlled, may hold Furosemide 40mg  and 20mg  and continue Metoprolol  12.5mg  daily.     Unspecified hypothyroidism Takes Levothyroxine 85mcg, last TSH 4.137 01/05/14    Atrial fibrillation Rate controlled, continue anticoagulation with Coumadin . Takes Metoprolol 12.5mg  daily. PT/INR 02/14/14.      Family/ Staff Communication: observe the patient.   Goals of Care: SNF  Labs/tests ordered: PICC-POA declined.

## 2014-02-24 ENCOUNTER — Non-Acute Institutional Stay (SKILLED_NURSING_FACILITY): Payer: Medicare Other | Admitting: Nurse Practitioner

## 2014-02-24 ENCOUNTER — Encounter: Payer: Self-pay | Admitting: Nurse Practitioner

## 2014-02-24 DIAGNOSIS — L03119 Cellulitis of unspecified part of limb: Secondary | ICD-10-CM

## 2014-02-24 DIAGNOSIS — J181 Lobar pneumonia, unspecified organism: Secondary | ICD-10-CM

## 2014-02-24 DIAGNOSIS — I10 Essential (primary) hypertension: Secondary | ICD-10-CM

## 2014-02-24 DIAGNOSIS — L03116 Cellulitis of left lower limb: Secondary | ICD-10-CM

## 2014-02-24 DIAGNOSIS — M25512 Pain in left shoulder: Secondary | ICD-10-CM

## 2014-02-24 DIAGNOSIS — A412 Sepsis due to unspecified staphylococcus: Secondary | ICD-10-CM

## 2014-02-24 DIAGNOSIS — I509 Heart failure, unspecified: Secondary | ICD-10-CM

## 2014-02-24 DIAGNOSIS — R509 Fever, unspecified: Secondary | ICD-10-CM

## 2014-02-24 DIAGNOSIS — K5900 Constipation, unspecified: Secondary | ICD-10-CM

## 2014-02-24 DIAGNOSIS — E039 Hypothyroidism, unspecified: Secondary | ICD-10-CM

## 2014-02-24 DIAGNOSIS — F332 Major depressive disorder, recurrent severe without psychotic features: Secondary | ICD-10-CM

## 2014-02-24 DIAGNOSIS — M25519 Pain in unspecified shoulder: Secondary | ICD-10-CM

## 2014-02-24 DIAGNOSIS — J189 Pneumonia, unspecified organism: Secondary | ICD-10-CM

## 2014-02-24 DIAGNOSIS — L02419 Cutaneous abscess of limb, unspecified: Secondary | ICD-10-CM

## 2014-02-24 NOTE — Assessment & Plan Note (Signed)
Resolved

## 2014-02-24 NOTE — Assessment & Plan Note (Signed)
02/13/14 blood culture Staphylococcus species susceptible to Gentamicin, Vancomycin, and Rifampin. Rifampin and Gentamicin should not be used as single drugs for treatment of staph infection per Lab. Will consider PICC and Vancomycin if POA consents. Clinically healed.

## 2014-02-24 NOTE — Assessment & Plan Note (Addendum)
The patient has returned to her baseline mentation and usual state of health.

## 2014-02-24 NOTE — Assessment & Plan Note (Signed)
Controlled, may hold Furosemide 40mg  and 20mg  and continue Metoprolol 12.5mg  daily.

## 2014-02-24 NOTE — Assessment & Plan Note (Addendum)
Worsened edema and weight gain since Furosemide held 02/13/14. Weights 01/31/14 # 194, 02/10/14 # 189, 02/17/14 #189, 02/24/14 # 199. Will resumed Furosemide and Kcl. Observe the patient. Update BMP in one week.

## 2014-02-24 NOTE — Assessment & Plan Note (Signed)
Takes Colcace bid--no problem

## 2014-02-24 NOTE — Progress Notes (Signed)
Patient ID: [name], female   DOB: 07-Nov-1914, 80 y.o.   MRN: [id number]   Code Status: DNR  Allergies  Allergen Reactions  . Amiodarone   . Penicillins   . Amoxicillin   . Celecoxib   . Ciprofloxacin   . Codeine   . Diltiazem   . Lactobacillus [Acidophilus Lactobacillus]   . Metoprolol Succinate     All Beta-blockers  . Metronidazole Hcl   . Other     floricene dye netribudazike    Chief Complaint  Patient presents with  . Medical Management of Chronic Issues  . Acute Visit    weight gain, BLE edeam L>R    HPI: Patient is a 80 y.o. female seen in the SNF at [facility] today for evaluation of worsened BLE edema/weight gain since Furosemide held, treated PNA/sepsis/UTI, and chronic medical conditions.  Problem List Items Addressed This Visit   Unspecified essential hypertension (Chronic)     Controlled, may hold Furosemide 40mg  and 20mg  and continue Metoprolol 12.5mg  daily.        Pain in joint, shoulder region (Chronic)     Pain is controlled with Tylenol 500mg  bid and Norco 5/325 1/2 qhs      Unspecified hypothyroidism     Takes Levothyroxine 64mcg, last TSH 4.137 01/05/14       Major depressive disorder, recurrent episode, severe, without mention of psychotic behavior     Managed with Cymbalta 30mg . Her usual state of health. She smiles, eats, sleep as usual. She participates therapy or facility activity when she desires.       Congestive heart failure, unspecified     Worsened edema and weight gain since Furosemide held 02/13/14. Weights 01/31/14 # 194, 02/10/14 # 189, 02/17/14 #189, 02/24/14 # 199. Will resumed Furosemide and Kcl. Observe the patient. Update BMP in one week.     Unspecified constipation     Takes Colcace bid--no problem      Fever, unspecified     Resolved.     Cellulitis of leg, left - Primary     Healed to her baseline chronic mild heat L Knee-continue Doxycycline 100mg  bid for life    Right lower lobe pneumonia   Fully treated and healed clinically.     Staphylococcus septicemia     02/13/14 blood culture Staphylococcus species susceptible to Gentamicin, Vancomycin, and Rifampin. Rifampin and Gentamicin should not be used as single drugs for treatment of staph infection per Lab. Will consider PICC and Vancomycin if POA consents. Clinically healed.         Review of Systems:  Review of Systems  Constitutional: Negative for fever, chills, weight loss, malaise/fatigue and diaphoresis.  HENT: Positive for hearing loss. Negative for congestion, ear discharge, ear pain and sore throat.   Eyes: Negative for blurred vision, pain, discharge and redness.  Respiratory: Negative for cough, sputum production, shortness of breath and wheezing.   Cardiovascular: Positive for leg swelling. Negative for chest pain, palpitations, orthopnea, claudication and PND.       LLE 1+, RLL trace  Gastrointestinal: Negative for nausea, vomiting, abdominal pain, diarrhea, constipation and blood in stool.  Genitourinary: Positive for frequency. Negative for dysuria, urgency and flank pain.  Musculoskeletal: Positive for joint pain. Negative for back pain and neck pain.       Decreased ROM of the R+L shoulders for over head movement.   Skin: Negative for itching and rash.       LLE lateral mid thigh to ankle warmth  and erythema-resolved to her baseline of mild heat of the left knee  Neurological: Negative for tremors, sensory change, speech change, focal weakness, seizures, loss of consciousness, weakness and headaches.  Endo/Heme/Allergies: Negative for environmental allergies and polydipsia. Does not bruise/bleed easily.  Psychiatric/Behavioral: Positive for memory loss. Negative for hallucinations. The patient is not nervous/anxious and does not have insomnia.      Past Medical History  Diagnosis Date  . Anemia   . Arthritis   . CHF (congestive heart failure)   . Depression   . Hypertension   . Chronic kidney disease     . Thyroid disease    Medications: Patient's Medications  New Prescriptions   No medications on file  Previous Medications   ACETAMINOPHEN (TYLENOL) 325 MG TABLET    Take 325 mg by mouth every 6 (six) hours as needed.     ACETAMINOPHEN (TYLENOL) 500 MG TABLET    Take 1 caplet by mouth twice daily   BETA CAROTENE W/MINERALS (OCUVITE) TABLET    Take 1 tablet by mouth daily.     CALCIUM-VITAMIN D (OSCAL WITH D) 500-200 MG-UNIT PER TABLET    Take 1 tablet by mouth 2 (two) times daily.     CAMPHOR-EUCALYPTUS-MENTHOL (VICKS VAPORUB) 4.7-1.2-2.6 % OINT    Apply topically. Rub on chest as directed  (may keep at bedside)   DOCUSATE SODIUM (COLACE) 100 MG CAPSULE    Take 100 mg by mouth 2 (two) times daily.     DOXYCYCLINE (VIBRAMYCIN) 100 MG CAPSULE    Take 100 mg by mouth 2 (two) times daily.   DULOXETINE (CYMBALTA) 30 MG CAPSULE    Take 30 mg by mouth daily.     FOLIC ACID (FOLVITE) 1 MG TABLET    Take 1 mg by mouth daily.     FUROSEMIDE (LASIX) 20 MG TABLET    Take 20 mg by mouth daily. ONE DAY 20 NEXT DAY 40 AND ALTERNATE.   HYDROCODONE-ACETAMINOPHEN (NORCO/VICODIN) 5-325 MG PER TABLET    Take 1/2 tablet by mouth at bedtime   LEVOTHYROXINE (SYNTHROID, LEVOTHROID) 50 MCG TABLET    Take 50 mcg by mouth daily before breakfast.   MAGNESIUM OXIDE (MAG-OX) 400 MG TABLET    Take 400 mg by mouth daily.     METOPROLOL SUCCINATE (TOPROL-XL) 25 MG 24 HR TABLET    Take 25 mg by mouth daily. Taking 1/2 tab    MULTIPLE VITAMIN (MULTIVITAMIN) TABLET    Take 1 tablet by mouth daily.     POTASSIUM CHLORIDE (MICRO-K) 10 MEQ CR CAPSULE    Take 10 mEq by mouth daily. Takes 20 meq (2) 10 meq daily    SENNOSIDES-DOCUSATE SODIUM (SENOKOT-S) 8.6-50 MG TABLET    Take by mouth daily.     WARFARIN (COUMADIN) 4 MG TABLET    Take 4 mg by mouth daily. Taking 4 mg alt with 5mg   Modified Medications   No medications on file  Discontinued Medications   No medications on file     Physical Exam: Physical Exam   Constitutional: She is oriented to person, place, and time. She appears well-developed and well-nourished. No distress.  HENT:  Head: Normocephalic and atraumatic.  Eyes: Conjunctivae and EOM are normal. Pupils are equal, round, and reactive to light.  Neck: Normal range of motion. Neck supple. No JVD present. No thyromegaly present.  Cardiovascular: Normal rate.  Frequent extrasystoles are present.  Murmur heard. Pulses:      Dorsalis pedis pulses are 2+ on the right  side, and 1+ on the left side.  EM 2/6  Pulmonary/Chest: She has no wheezes. She has no rales. She exhibits no tenderness.  Occasional expiratory wheezes at the posterior lower lungs-resolved.   Abdominal: Soft. Bowel sounds are normal.  Musculoskeletal: She exhibits edema (left knee) and tenderness.       Left shoulder: She exhibits decreased range of motion, tenderness, crepitus and pain.       Legs: Anterior distal femur 2x2cm firm nodule palpated, fixed, hard, non tender. Reduced ROM of the R+L shoulder for over head activities. Left wrist/left shoulder/left knee pain on and off.  Mild heat R knee. LLE 1+. RLE trace.   Lymphadenopathy:    She has no cervical adenopathy.  Neurological: She is alert and oriented to person, place, and time. She has normal reflexes. No cranial nerve deficit. She exhibits normal muscle tone. Coordination normal.  Skin: Skin is dry. No rash noted. She is not diaphoretic. There is erythema (left knee).  LLE lateral mid thigh to ankle warmth and erythema. Pigmented BLE   Psychiatric: She has a normal mood and affect. Her behavior is normal. Judgment and thought content normal. Cognition and memory are impaired. She exhibits abnormal recent memory.    Filed Vitals:   02/24/14 1254  BP: 118/70  Pulse: 64  Temp: 98.1 F (36.7 C)  TempSrc: Tympanic  Resp: 16      Labs reviewed: Basic Metabolic Panel:  Recent Labs  07/04/13  01/05/14 02/04/14 02/13/14  NA 137  < > 138 135* 134*  K  4.4  < > 4.5 4.1 4.9  BUN 26*  < > 39* 26* 24*  CREATININE 1.1  < > 1.2* 1.2* 1.2*  TSH 3.34  --  4.14  --   --   < > = values in this interval not displayed. Liver Function Tests:  Recent Labs  07/04/13 01/05/14 02/13/14  AST 29 20 17   ALT 19 15 12   ALKPHOS 49 57 60   CBC:  Recent Labs  04/13/13 1150  01/16/14 02/04/14 02/13/14  WBC 21.6*  < > 21.2 31.7 29.3  HGB 12.3  < > 11.0* 11.9* 11.3*  HCT 38.5  < > 33* 34* 33*  MCV 95  --   --   --   --   PLT 115*  < > 100* 99* 130*  < > = values in this interval not displayed. Past Procedures:  06/30/13 CXR no acute cardiopulmonary abnormalities can be identified.   02/10/14 CXR patchy infiltrate right lower lung consistent with pneumonia, mild cardiomegaly with borderline pulmonary vasculature.   02/10/14 X-ray L knee: total left knee prosthesis with no definite complication, fractures, or dislocation.    Assessment/Plan Cellulitis of leg, left Healed to her baseline chronic mild heat L Knee-continue Doxycycline 100mg  bid for life  Fever, unspecified Resolved.   Staphylococcus septicemia 02/13/14 blood culture Staphylococcus species susceptible to Gentamicin, Vancomycin, and Rifampin. Rifampin and Gentamicin should not be used as single drugs for treatment of staph infection per Lab. Will consider PICC and Vancomycin if POA consents. Clinically healed.    Altered mental status The patient has returned to her baseline mentation and usual state of health.   Congestive heart failure, unspecified Worsened edema and weight gain since Furosemide held 02/13/14. Weights 01/31/14 # 194, 02/10/14 # 189, 02/17/14 #189, 02/24/14 # 199. Will resumed Furosemide and Kcl. Observe the patient. Update BMP in one week.   Unspecified hypothyroidism Takes Levothyroxine 57mcg, last TSH 4.137 01/05/14  Major depressive disorder, recurrent episode, severe, without mention of psychotic behavior Managed with Cymbalta 30mg . Her usual state of health.  She smiles, eats, sleep as usual. She participates therapy or facility activity when she desires.     Unspecified essential hypertension Controlled, may hold Furosemide 40mg  and 20mg  and continue Metoprolol 12.5mg  daily.      Pain in joint, shoulder region Pain is controlled with Tylenol 500mg  bid and Norco 5/325 1/2 qhs    Unspecified constipation Takes Colcace bid--no problem    Right lower lobe pneumonia Fully treated and healed clinically.     Family/ Staff Communication: observe the patient.   Goals of Care: SNF  Labs/tests ordered: BMP one week.

## 2014-02-24 NOTE — Assessment & Plan Note (Signed)
Managed with Cymbalta 30mg . Her usual state of health. She smiles, eats, sleep as usual. She participates therapy or facility activity when she desires.

## 2014-02-24 NOTE — Assessment & Plan Note (Signed)
Healed to her baseline chronic mild heat L Knee-continue Doxycycline 100mg  bid for life

## 2014-02-24 NOTE — Assessment & Plan Note (Signed)
Pain is controlled with Tylenol 500mg bid and Norco 5/325 1/2 qhs.     

## 2014-02-24 NOTE — Assessment & Plan Note (Signed)
Takes Levothyroxine 50mcg, last TSH 4.137 01/05/14  

## 2014-02-24 NOTE — Assessment & Plan Note (Signed)
Fully treated and healed clinically.

## 2014-03-07 ENCOUNTER — Encounter: Payer: Self-pay | Admitting: Nurse Practitioner

## 2014-03-07 ENCOUNTER — Non-Acute Institutional Stay (SKILLED_NURSING_FACILITY): Payer: Medicare Other | Admitting: Nurse Practitioner

## 2014-03-07 DIAGNOSIS — L02419 Cutaneous abscess of limb, unspecified: Secondary | ICD-10-CM

## 2014-03-07 DIAGNOSIS — M25512 Pain in left shoulder: Secondary | ICD-10-CM

## 2014-03-07 DIAGNOSIS — E039 Hypothyroidism, unspecified: Secondary | ICD-10-CM

## 2014-03-07 DIAGNOSIS — L03119 Cellulitis of unspecified part of limb: Secondary | ICD-10-CM

## 2014-03-07 DIAGNOSIS — I482 Chronic atrial fibrillation, unspecified: Secondary | ICD-10-CM

## 2014-03-07 DIAGNOSIS — I10 Essential (primary) hypertension: Secondary | ICD-10-CM

## 2014-03-07 DIAGNOSIS — I4891 Unspecified atrial fibrillation: Secondary | ICD-10-CM

## 2014-03-07 DIAGNOSIS — M25519 Pain in unspecified shoulder: Secondary | ICD-10-CM

## 2014-03-07 DIAGNOSIS — K5900 Constipation, unspecified: Secondary | ICD-10-CM

## 2014-03-07 DIAGNOSIS — L03116 Cellulitis of left lower limb: Secondary | ICD-10-CM

## 2014-03-07 DIAGNOSIS — I509 Heart failure, unspecified: Secondary | ICD-10-CM

## 2014-03-07 DIAGNOSIS — F332 Major depressive disorder, recurrent severe without psychotic features: Secondary | ICD-10-CM

## 2014-03-07 NOTE — Assessment & Plan Note (Signed)
Chronic left knee warmth as usual since left knee fusion surgery. Left lateral lower leg redness, but no heat, probable from chronic venous insufficiency in setting of chronic dark brown pigmentation BLE. Chronic LLE edema is at her baseline since Furosemide resumed. The patient denied pain in LLE. 03/06/14 Venous Doppler LLE: negative for deep venous thrombosis at major veins of left lower extremity. She takes Doxycycline 100mg  bid indefinitely since the knee fusion surgery. Continue to observe.

## 2014-03-07 NOTE — Assessment & Plan Note (Signed)
Controlled, may hold Furosemide 40mg  and 20mg  and continue Metoprolol 12.5mg  daily.

## 2014-03-07 NOTE — Assessment & Plan Note (Signed)
Compensated clinically, edema-mainly in LLE-1+ since resumed Furosemide and Kcl. Observe the patient.

## 2014-03-07 NOTE — Assessment & Plan Note (Signed)
Takes Colcace bid--no problem

## 2014-03-07 NOTE — Assessment & Plan Note (Signed)
Rate controlled, continue anticoagulation with Coumadin . Takes Metoprolol 12.5mg  daily. INR 2.13 03/06/14

## 2014-03-07 NOTE — Assessment & Plan Note (Signed)
Pain is controlled with Tylenol 500mg bid and Norco 5/325 1/2 qhs.     

## 2014-03-07 NOTE — Progress Notes (Signed)
Patient ID: [name], female   DOB: November 20, 1914, 80 y.o.   MRN: [id number]   Code Status: DNR  Allergies  Allergen Reactions  . Amiodarone   . Penicillins   . Amoxicillin   . Celecoxib   . Ciprofloxacin   . Codeine   . Diltiazem   . Lactobacillus [Acidophilus Lactobacillus]   . Metoprolol Succinate     All Beta-blockers  . Metronidazole Hcl   . Other     floricene dye netribudazike    Chief Complaint  Patient presents with  . Medical Management of Chronic Issues  . Acute Visit    LLE swelling, warmth, redness.     HPI: Patient is a 80 y.o. female seen in the SNF at [facility] today for evaluation of left knee and LLE from knee down-warmth, swelling, and redness,  and chronic medical conditions.   Recently she has been treated for PNA/sepsis/UTI/LLE cellulitis fully  Problem List Items Addressed This Visit   Unspecified essential hypertension (Chronic)     Controlled, may hold Furosemide 40mg  and 20mg  and continue Metoprolol 12.5mg  daily.         Pain in joint, shoulder region (Chronic)     Pain is controlled with Tylenol 500mg  bid and Norco 5/325 1/2 qhs      Unspecified hypothyroidism     Takes Levothyroxine 27mcg, last TSH 4.137 01/05/14     Major depressive disorder, recurrent episode, severe, without mention of psychotic behavior     Managed with Cymbalta 30mg . Her usual state of health. She smiles, eats, sleep as usual. She participates therapy or facility activity when she desires    Atrial fibrillation     Rate controlled, continue anticoagulation with Coumadin . Takes Metoprolol 12.5mg  daily. INR 2.13 03/06/14      Congestive heart failure, unspecified     Compensated clinically, edema-mainly in LLE-1+ since resumed Furosemide and Kcl. Observe the patient.      Unspecified constipation     Takes Colcace bid--no problem      Cellulitis of leg, left - Primary     Chronic left knee warmth as usual since left knee fusion surgery.  Left lateral lower leg redness, but no heat, probable from chronic venous insufficiency in setting of chronic dark brown pigmentation BLE. Chronic LLE edema is at her baseline since Furosemide resumed. The patient denied pain in LLE. 03/06/14 Venous Doppler LLE: negative for deep venous thrombosis at major veins of left lower extremity. She takes Doxycycline 100mg  bid indefinitely since the knee fusion surgery. Continue to observe.        Review of Systems:  Review of Systems  Constitutional: Negative for fever, chills, weight loss, malaise/fatigue and diaphoresis.  HENT: Positive for hearing loss. Negative for congestion, ear discharge, ear pain and sore throat.   Eyes: Negative for blurred vision, pain, discharge and redness.  Respiratory: Negative for cough, sputum production, shortness of breath and wheezing.   Cardiovascular: Positive for leg swelling. Negative for chest pain, palpitations, orthopnea, claudication and PND.       LLE 1+, RLL trace  Gastrointestinal: Negative for nausea, vomiting, abdominal pain, diarrhea, constipation and blood in stool.  Genitourinary: Positive for frequency. Negative for dysuria, urgency and flank pain.  Musculoskeletal: Positive for joint pain. Negative for back pain and neck pain.       Decreased ROM of the R+L shoulders for over head movement.   Skin: Negative for itching and rash.       baseline of  mild heat of the left knee. Dark redness noted lateral LLE w/o warmth or tenderness.   Neurological: Negative for tremors, sensory change, speech change, focal weakness, seizures, loss of consciousness, weakness and headaches.  Endo/Heme/Allergies: Negative for environmental allergies and polydipsia. Does not bruise/bleed easily.  Psychiatric/Behavioral: Positive for memory loss. Negative for hallucinations. The patient is not nervous/anxious and does not have insomnia.      Past Medical History  Diagnosis Date  . Anemia   . Arthritis   . CHF  (congestive heart failure)   . Depression   . Hypertension   . Chronic kidney disease   . Thyroid disease    Medications: Patient's Medications  New Prescriptions   No medications on file  Previous Medications   ACETAMINOPHEN (TYLENOL) 325 MG TABLET    Take 325 mg by mouth every 6 (six) hours as needed.     ACETAMINOPHEN (TYLENOL) 500 MG TABLET    Take 1 caplet by mouth twice daily   BETA CAROTENE W/MINERALS (OCUVITE) TABLET    Take 1 tablet by mouth daily.     CALCIUM-VITAMIN D (OSCAL WITH D) 500-200 MG-UNIT PER TABLET    Take 1 tablet by mouth 2 (two) times daily.     CAMPHOR-EUCALYPTUS-MENTHOL (VICKS VAPORUB) 4.7-1.2-2.6 % OINT    Apply topically. Rub on chest as directed  (may keep at bedside)   DOCUSATE SODIUM (COLACE) 100 MG CAPSULE    Take 100 mg by mouth 2 (two) times daily.     DOXYCYCLINE (VIBRAMYCIN) 100 MG CAPSULE    Take 100 mg by mouth 2 (two) times daily.   DULOXETINE (CYMBALTA) 30 MG CAPSULE    Take 30 mg by mouth daily.     FOLIC ACID (FOLVITE) 1 MG TABLET    Take 1 mg by mouth daily.     FUROSEMIDE (LASIX) 20 MG TABLET    Take 20 mg by mouth daily. ONE DAY 20 NEXT DAY 40 AND ALTERNATE.   HYDROCODONE-ACETAMINOPHEN (NORCO/VICODIN) 5-325 MG PER TABLET    Take 1/2 tablet by mouth at bedtime   LEVOTHYROXINE (SYNTHROID, LEVOTHROID) 50 MCG TABLET    Take 50 mcg by mouth daily before breakfast.   MAGNESIUM OXIDE (MAG-OX) 400 MG TABLET    Take 400 mg by mouth daily.     METOPROLOL SUCCINATE (TOPROL-XL) 25 MG 24 HR TABLET    Take 25 mg by mouth daily. Taking 1/2 tab    MULTIPLE VITAMIN (MULTIVITAMIN) TABLET    Take 1 tablet by mouth daily.     POTASSIUM CHLORIDE (MICRO-K) 10 MEQ CR CAPSULE    Take 10 mEq by mouth daily. Takes 20 meq (2) 10 meq daily    SENNOSIDES-DOCUSATE SODIUM (SENOKOT-S) 8.6-50 MG TABLET    Take by mouth daily.     WARFARIN (COUMADIN) 4 MG TABLET    Take 4 mg by mouth daily. Taking 4 mg alt with 5mg   Modified Medications   No medications on file    Discontinued Medications   No medications on file     Physical Exam: Physical Exam  Constitutional: She is oriented to person, place, and time. She appears well-developed and well-nourished. No distress.  HENT:  Head: Normocephalic and atraumatic.  Eyes: Conjunctivae and EOM are normal. Pupils are equal, round, and reactive to light.  Neck: Normal range of motion. Neck supple. No JVD present. No thyromegaly present.  Cardiovascular: Normal rate.  Frequent extrasystoles are present.  Murmur heard. Pulses:      Dorsalis pedis pulses are  2+ on the right side, and 1+ on the left side.  EM 2/6  Pulmonary/Chest: She has no wheezes. She has no rales. She exhibits no tenderness.  Occasional expiratory wheezes at the posterior lower lungs-resolved.   Abdominal: Soft. Bowel sounds are normal.  Musculoskeletal: She exhibits edema (left knee) and tenderness.       Left shoulder: She exhibits decreased range of motion, tenderness, crepitus and pain.       Legs: Anterior distal femur 2x2cm firm nodule palpated, fixed, hard, non tender. Reduced ROM of the R+L shoulder for over head activities. Left wrist/left shoulder/left knee pain on and off.  Mild heat R knee. LLE 1+. RLE trace.   Lymphadenopathy:    She has no cervical adenopathy.  Neurological: She is alert and oriented to person, place, and time. She has normal reflexes. No cranial nerve deficit. She exhibits normal muscle tone. Coordination normal.  Skin: Skin is dry. No rash noted. She is not diaphoretic. There is erythema (left knee).  baseline of mild heat of the left knee. Dark redness noted lateral LLE w/o warmth or tenderness. Dark pigmented anterior BLE  Psychiatric: She has a normal mood and affect. Her behavior is normal. Judgment and thought content normal. Cognition and memory are impaired. She exhibits abnormal recent memory.    Filed Vitals:   03/07/14 1244  BP: 118/62  Pulse: 70  Temp: 98.8 F (37.1 C)  TempSrc: Tympanic   Resp: 16      Labs reviewed: Basic Metabolic Panel:  Recent Labs  07/04/13  01/05/14 02/04/14 02/13/14  NA 137  < > 138 135* 134*  K 4.4  < > 4.5 4.1 4.9  BUN 26*  < > 39* 26* 24*  CREATININE 1.1  < > 1.2* 1.2* 1.2*  TSH 3.34  --  4.14  --   --   < > = values in this interval not displayed. Liver Function Tests:  Recent Labs  07/04/13 01/05/14 02/13/14  AST 29 20 17   ALT 19 15 12   ALKPHOS 49 57 60   CBC:  Recent Labs  04/13/13 1150  01/16/14 02/04/14 02/13/14  WBC 21.6*  < > 21.2 31.7 29.3  HGB 12.3  < > 11.0* 11.9* 11.3*  HCT 38.5  < > 33* 34* 33*  MCV 95  --   --   --   --   PLT 115*  < > 100* 99* 130*  < > = values in this interval not displayed. Past Procedures:  06/30/13 CXR no acute cardiopulmonary abnormalities can be identified.   02/10/14 CXR patchy infiltrate right lower lung consistent with pneumonia, mild cardiomegaly with borderline pulmonary vasculature.   02/10/14 X-ray L knee: total left knee prosthesis with no definite complication, fractures, or dislocation.    Assessment/Plan Cellulitis of leg, left Chronic left knee warmth as usual since left knee fusion surgery. Left lateral lower leg redness, but no heat, probable from chronic venous insufficiency in setting of chronic dark brown pigmentation BLE. Chronic LLE edema is at her baseline since Furosemide resumed. The patient denied pain in LLE. 03/06/14 Venous Doppler LLE: negative for deep venous thrombosis at major veins of left lower extremity. She takes Doxycycline 100mg  bid indefinitely since the knee fusion surgery. Continue to observe.   Unspecified hypothyroidism Takes Levothyroxine 15mcg, last TSH 4.137 01/05/14   Major depressive disorder, recurrent episode, severe, without mention of psychotic behavior Managed with Cymbalta 30mg . Her usual state of health. She smiles, eats, sleep as usual. She  participates therapy or facility activity when she desires  Atrial fibrillation Rate  controlled, continue anticoagulation with Coumadin . Takes Metoprolol 12.5mg  daily. INR 2.13 03/06/14    Congestive heart failure, unspecified Compensated clinically, edema-mainly in LLE-1+ since resumed Furosemide and Kcl. Observe the patient.    Unspecified constipation Takes Colcace bid--no problem    Unspecified essential hypertension Controlled, may hold Furosemide 40mg  and 20mg  and continue Metoprolol 12.5mg  daily.       Pain in joint, shoulder region Pain is controlled with Tylenol 500mg  bid and Norco 5/325 1/2 qhs      Family/ Staff Communication: observe the patient.   Goals of Care: SNF  Labs/tests ordered: none

## 2014-03-07 NOTE — Assessment & Plan Note (Signed)
Takes Levothyroxine 50mcg, last TSH 4.137 01/05/14  

## 2014-03-07 NOTE — Assessment & Plan Note (Signed)
Managed with Cymbalta 30mg . Her usual state of health. She smiles, eats, sleep as usual. She participates therapy or facility activity when she desires

## 2014-03-31 ENCOUNTER — Encounter: Payer: Self-pay | Admitting: Nurse Practitioner

## 2014-03-31 ENCOUNTER — Non-Acute Institutional Stay (SKILLED_NURSING_FACILITY): Payer: Medicare Other | Admitting: Nurse Practitioner

## 2014-03-31 DIAGNOSIS — L02419 Cutaneous abscess of limb, unspecified: Secondary | ICD-10-CM

## 2014-03-31 DIAGNOSIS — I10 Essential (primary) hypertension: Secondary | ICD-10-CM

## 2014-03-31 DIAGNOSIS — F332 Major depressive disorder, recurrent severe without psychotic features: Secondary | ICD-10-CM

## 2014-03-31 DIAGNOSIS — I509 Heart failure, unspecified: Secondary | ICD-10-CM

## 2014-03-31 DIAGNOSIS — M25519 Pain in unspecified shoulder: Secondary | ICD-10-CM

## 2014-03-31 DIAGNOSIS — L03119 Cellulitis of unspecified part of limb: Secondary | ICD-10-CM

## 2014-03-31 DIAGNOSIS — M25512 Pain in left shoulder: Secondary | ICD-10-CM

## 2014-03-31 DIAGNOSIS — K5900 Constipation, unspecified: Secondary | ICD-10-CM

## 2014-03-31 DIAGNOSIS — L03116 Cellulitis of left lower limb: Secondary | ICD-10-CM

## 2014-03-31 NOTE — Assessment & Plan Note (Signed)
Chronic left knee warmth as usual since left knee fusion surgery. Left lateral lower leg redness, but no heat, probable from chronic venous insufficiency in setting of chronic dark brown pigmentation BLE. Chronic LLE edema (1+) is at her baseline since Furosemide resumed. LLE shin/ankle tenderness.  03/06/14 Venous Doppler LLE: negative for deep venous thrombosis at major veins of left lower extremity. She takes Doxycycline 100mg  bid indefinitely since the knee fusion surgery. Continue to observe.

## 2014-03-31 NOTE — Assessment & Plan Note (Signed)
Pain is controlled with Tylenol 500mg bid and Norco 5/325 1/2 qhs.     

## 2014-03-31 NOTE — Assessment & Plan Note (Signed)
Controlled, continue Metoprolol 12.5mg daily.    

## 2014-03-31 NOTE — Assessment & Plan Note (Signed)
Compensated clinically, edema-mainly in LLE-1+ since resumed Furosemide and Kcl. Observe the patient.

## 2014-03-31 NOTE — Assessment & Plan Note (Addendum)
Well managed with Colcace bid.

## 2014-03-31 NOTE — Assessment & Plan Note (Signed)
Managed with Cymbalta 30mg. Her usual state of health. She smiles, eats, sleep as usual. No complaints. She participates therapy or facility activity when she desires  

## 2014-03-31 NOTE — Assessment & Plan Note (Signed)
Rate controlled, continue anticoagulation with Coumadin . Takes Metoprolol 12.5mg  daily. INR 2.13 03/06/14

## 2014-03-31 NOTE — Progress Notes (Signed)
Patient ID: [name], female   DOB: 1915/02/04, 80 y.o.   MRN: [id number]   Code Status: DNR  Allergies  Allergen Reactions  . Amiodarone   . Penicillins   . Amoxicillin   . Celecoxib   . Ciprofloxacin   . Codeine   . Diltiazem   . Lactobacillus [Acidophilus Lactobacillus]   . Metoprolol Succinate     All Beta-blockers  . Metronidazole Hcl   . Other     floricene dye netribudazike    Chief Complaint  Patient presents with  . Medical Management of Chronic Issues    HPI: Patient is a 80 y.o. female seen in the SNF at [facility] today for evaluation of chronic medical conditions.  Problem List Items Addressed This Visit   Unspecified essential hypertension (Chronic)     Controlled, continue Metoprolol 12.5mg  daily.           Pain in joint, shoulder region (Chronic)     Pain is controlled with Tylenol 500mg  bid and Norco 5/325 1/2 qhs.    Major depressive disorder, recurrent episode, severe, without mention of psychotic behavior     Managed with Cymbalta 30mg . Her usual state of health. She smiles, eats, sleep as usual. No complaints. She participates therapy or facility activity when she desires      Congestive heart failure, unspecified     Compensated clinically, edema-mainly in LLE-1+ since resumed Furosemide and Kcl. Observe the patient.        Unspecified constipation     Well managed with Colcace bid.        Cellulitis of leg, left - Primary     Chronic left knee warmth as usual since left knee fusion surgery. Left lateral lower leg redness, but no heat, probable from chronic venous insufficiency in setting of chronic dark brown pigmentation BLE. Chronic LLE edema (1+) is at her baseline since Furosemide resumed. LLE shin/ankle tenderness.  03/06/14 Venous Doppler LLE: negative for deep venous thrombosis at major veins of left lower extremity. She takes Doxycycline 100mg  bid indefinitely since the knee fusion surgery. Continue to observe.            Review of Systems:  Review of Systems  Constitutional: Negative for fever, chills, weight loss, malaise/fatigue and diaphoresis.  HENT: Positive for hearing loss. Negative for congestion, ear discharge, ear pain and sore throat.   Eyes: Negative for blurred vision, pain, discharge and redness.  Respiratory: Negative for cough, sputum production, shortness of breath and wheezing.   Cardiovascular: Positive for leg swelling. Negative for chest pain, palpitations, orthopnea, claudication and PND.       LLE 1+, RLL trace  Gastrointestinal: Negative for nausea, vomiting, abdominal pain, diarrhea, constipation and blood in stool.  Genitourinary: Positive for frequency. Negative for dysuria, urgency and flank pain.  Musculoskeletal: Positive for joint pain. Negative for back pain and neck pain.       Decreased ROM of the R+L shoulders for over head movement.   Skin: Negative for itching and rash.       No warmth to L knee today. Dark redness noted lateral LLE w/o warmth or tenderness.   Neurological: Negative for tremors, sensory change, speech change, focal weakness, seizures, loss of consciousness, weakness and headaches.  Endo/Heme/Allergies: Negative for environmental allergies and polydipsia. Does not bruise/bleed easily.  Psychiatric/Behavioral: Positive for memory loss. Negative for hallucinations. The patient is not nervous/anxious and does not have insomnia.      Past Medical History  Diagnosis  Date  . Anemia   . Arthritis   . CHF (congestive heart failure)   . Depression   . Hypertension   . Chronic kidney disease   . Thyroid disease    History reviewed. No pertinent past surgical history. Social History:   has no tobacco, alcohol, and drug history on file.  No family history on file.  Medications: Patient's Medications  New Prescriptions   No medications on file  Previous Medications   ACETAMINOPHEN (TYLENOL) 325 MG TABLET    Take 325 mg by mouth every 6  (six) hours as needed.     ACETAMINOPHEN (TYLENOL) 500 MG TABLET    Take 1 caplet by mouth twice daily   BETA CAROTENE W/MINERALS (OCUVITE) TABLET    Take 1 tablet by mouth daily.     CALCIUM-VITAMIN D (OSCAL WITH D) 500-200 MG-UNIT PER TABLET    Take 1 tablet by mouth 2 (two) times daily.     CAMPHOR-EUCALYPTUS-MENTHOL (VICKS VAPORUB) 4.7-1.2-2.6 % OINT    Apply topically. Rub on chest as directed  (may keep at bedside)   DOCUSATE SODIUM (COLACE) 100 MG CAPSULE    Take 100 mg by mouth 2 (two) times daily.     DOXYCYCLINE (VIBRAMYCIN) 100 MG CAPSULE    Take 100 mg by mouth 2 (two) times daily.   DULOXETINE (CYMBALTA) 30 MG CAPSULE    Take 30 mg by mouth daily.     FOLIC ACID (FOLVITE) 1 MG TABLET    Take 1 mg by mouth daily.     FUROSEMIDE (LASIX) 20 MG TABLET    Take 20 mg by mouth daily. ONE DAY 20 NEXT DAY 40 AND ALTERNATE.   HYDROCODONE-ACETAMINOPHEN (NORCO/VICODIN) 5-325 MG PER TABLET    Take 1/2 tablet by mouth at bedtime   LEVOTHYROXINE (SYNTHROID, LEVOTHROID) 50 MCG TABLET    Take 50 mcg by mouth daily before breakfast.   MAGNESIUM OXIDE (MAG-OX) 400 MG TABLET    Take 400 mg by mouth daily.     METOPROLOL SUCCINATE (TOPROL-XL) 25 MG 24 HR TABLET    Take 25 mg by mouth daily. Taking 1/2 tab    MULTIPLE VITAMIN (MULTIVITAMIN) TABLET    Take 1 tablet by mouth daily.     POTASSIUM CHLORIDE (MICRO-K) 10 MEQ CR CAPSULE    Take 10 mEq by mouth daily. Takes 20 meq (2) 10 meq daily    SENNOSIDES-DOCUSATE SODIUM (SENOKOT-S) 8.6-50 MG TABLET    Take by mouth daily.     WARFARIN (COUMADIN) 4 MG TABLET    Take 4 mg by mouth daily. Taking 4 mg alt with 5mg   Modified Medications   No medications on file  Discontinued Medications   No medications on file     Physical Exam: Physical Exam  Constitutional: She is oriented to person, place, and time. She appears well-developed and well-nourished. No distress.  HENT:  Head: Normocephalic and atraumatic.  Eyes: Conjunctivae and EOM are normal. Pupils  are equal, round, and reactive to light.  Neck: Normal range of motion. Neck supple. No JVD present. No thyromegaly present.  Cardiovascular: Normal rate.  Frequent extrasystoles are present.  Murmur heard. Pulses:      Dorsalis pedis pulses are 2+ on the right side, and 1+ on the left side.  EM 2/6  Pulmonary/Chest: She has no wheezes. She has no rales. She exhibits no tenderness.  Occasional expiratory wheezes at the posterior lower lungs-resolved.   Abdominal: Soft. Bowel sounds are normal.  Musculoskeletal: She exhibits  edema (left knee) and tenderness.       Left shoulder: She exhibits decreased range of motion, tenderness, crepitus and pain.       Legs: Anterior distal femur 2x2cm firm nodule palpated, fixed, hard, non tender. Reduced ROM of the R+L shoulder for over head activities. Left wrist/left shoulder/left knee pain on and off. Crepitus to L shoulder, endorses paint with ROM. LLE 1+. RLE trace.   Lymphadenopathy:    She has no cervical adenopathy.  Neurological: She is alert and oriented to person, place, and time. She has normal reflexes. No cranial nerve deficit. She exhibits normal muscle tone. Coordination normal.  Skin: Skin is dry. No rash noted. She is not diaphoretic. There is erythema (left knee).  baseline of mild heat of the left knee. Dark redness noted lateral LLE w/o warmth or tenderness. Dark pigmented anterior RLE  Psychiatric: She has a normal mood and affect. Her behavior is normal. Judgment and thought content normal. Cognition and memory are impaired. She exhibits abnormal recent memory.     Filed Vitals:   03/01/14 1240 03/21/14 1240  BP:  146/80  Pulse:  72  Temp:  96 F (35.6 C)  TempSrc:  Tympanic  Resp:  18  Weight: 196 lb 8 oz (89.132 kg)       Labs reviewed: Basic Metabolic Panel:  Recent Labs  07/04/13  01/05/14 02/04/14 02/13/14  NA 137  < > 138 135* 134*  K 4.4  < > 4.5 4.1 4.9  BUN 26*  < > 39* 26* 24*  CREATININE 1.1  < > 1.2*  1.2* 1.2*  TSH 3.34  --  4.14  --   --   < > = values in this interval not displayed. Liver Function Tests:  Recent Labs  07/04/13 01/05/14 02/13/14  AST 29 20 17   ALT 19 15 12   ALKPHOS 49 57 60   No results found for this basename: LIPASE, AMYLASE,  in the last 8760 hours No results found for this basename: AMMONIA,  in the last 8760 hours CBC:  Recent Labs  04/13/13 1150  01/16/14 02/04/14 02/13/14  WBC 21.6*  < > 21.2 31.7 29.3  HGB 12.3  < > 11.0* 11.9* 11.3*  HCT 38.5  < > 33* 34* 33*  MCV 95  --   --   --   --   PLT 115*  < > 100* 99* 130*  < > = values in this interval not displayed. Lipid Panel: No results found for this basename: CHOL, HDL, LDLCALC, TRIG, CHOLHDL, LDLDIRECT,  in the last 8760 hours  Past Procedures:  n/a   Assessment/Plan Atrial fibrillation Rate controlled, continue anticoagulation with Coumadin . Takes Metoprolol 12.5mg  daily. INR 2.13 03/06/14      Cellulitis of leg, left Chronic left knee warmth as usual since left knee fusion surgery. Left lateral lower leg redness, but no heat, probable from chronic venous insufficiency in setting of chronic dark brown pigmentation BLE. Chronic LLE edema (1+) is at her baseline since Furosemide resumed. LLE shin/ankle tenderness.  03/06/14 Venous Doppler LLE: negative for deep venous thrombosis at major veins of left lower extremity. She takes Doxycycline 100mg  bid indefinitely since the knee fusion surgery. Continue to observe.     Congestive heart failure, unspecified Compensated clinically, edema-mainly in LLE-1+ since resumed Furosemide and Kcl. Observe the patient.      Major depressive disorder, recurrent episode, severe, without mention of psychotic behavior Managed with Cymbalta 30mg . Her usual state of  health. She smiles, eats, sleep as usual. No complaints. She participates therapy or facility activity when she desires    Pain in joint, shoulder region Pain is controlled with Tylenol  500mg  bid and Norco 5/325 1/2 qhs.  Unspecified constipation Well managed with Colcace bid.      Unspecified essential hypertension Controlled, continue Metoprolol 12.5mg  daily.           Family/ Staff Communication: observe the patient.   Goals of Care: SNF  Labs/tests ordered: none

## 2014-04-09 ENCOUNTER — Encounter: Payer: Self-pay | Admitting: Nurse Practitioner

## 2014-04-11 ENCOUNTER — Encounter: Payer: Self-pay | Admitting: Nurse Practitioner

## 2014-05-02 ENCOUNTER — Non-Acute Institutional Stay (SKILLED_NURSING_FACILITY): Payer: Medicare Other | Admitting: Adult Health

## 2014-05-02 DIAGNOSIS — Z7901 Long term (current) use of anticoagulants: Secondary | ICD-10-CM

## 2014-05-02 DIAGNOSIS — I2782 Chronic pulmonary embolism: Secondary | ICD-10-CM

## 2014-05-02 DIAGNOSIS — N390 Urinary tract infection, site not specified: Secondary | ICD-10-CM

## 2014-05-02 DIAGNOSIS — I482 Chronic atrial fibrillation, unspecified: Secondary | ICD-10-CM

## 2014-05-02 DIAGNOSIS — M1711 Unilateral primary osteoarthritis, right knee: Secondary | ICD-10-CM

## 2014-05-02 DIAGNOSIS — Z5181 Encounter for therapeutic drug level monitoring: Secondary | ICD-10-CM

## 2014-05-02 DIAGNOSIS — E039 Hypothyroidism, unspecified: Secondary | ICD-10-CM

## 2014-05-02 DIAGNOSIS — I10 Essential (primary) hypertension: Secondary | ICD-10-CM

## 2014-05-02 DIAGNOSIS — F332 Major depressive disorder, recurrent severe without psychotic features: Secondary | ICD-10-CM

## 2014-05-09 ENCOUNTER — Non-Acute Institutional Stay (SKILLED_NURSING_FACILITY): Payer: Medicare Other | Admitting: Nurse Practitioner

## 2014-05-09 DIAGNOSIS — I10 Essential (primary) hypertension: Secondary | ICD-10-CM

## 2014-05-09 DIAGNOSIS — K5900 Constipation, unspecified: Secondary | ICD-10-CM

## 2014-05-09 DIAGNOSIS — I482 Chronic atrial fibrillation, unspecified: Secondary | ICD-10-CM

## 2014-05-09 DIAGNOSIS — M1711 Unilateral primary osteoarthritis, right knee: Secondary | ICD-10-CM

## 2014-05-09 DIAGNOSIS — I509 Heart failure, unspecified: Secondary | ICD-10-CM

## 2014-05-09 DIAGNOSIS — F332 Major depressive disorder, recurrent severe without psychotic features: Secondary | ICD-10-CM

## 2014-05-09 DIAGNOSIS — M25519 Pain in unspecified shoulder: Secondary | ICD-10-CM

## 2014-05-11 ENCOUNTER — Encounter: Payer: Self-pay | Admitting: Nurse Practitioner

## 2014-05-11 ENCOUNTER — Encounter: Payer: Self-pay | Admitting: Adult Health

## 2014-05-11 DIAGNOSIS — Z7901 Long term (current) use of anticoagulants: Secondary | ICD-10-CM

## 2014-05-11 DIAGNOSIS — E039 Hypothyroidism, unspecified: Secondary | ICD-10-CM | POA: Insufficient documentation

## 2014-05-11 DIAGNOSIS — N390 Urinary tract infection, site not specified: Secondary | ICD-10-CM | POA: Insufficient documentation

## 2014-05-11 DIAGNOSIS — I2782 Chronic pulmonary embolism: Secondary | ICD-10-CM | POA: Insufficient documentation

## 2014-05-11 DIAGNOSIS — Z5181 Encounter for therapeutic drug level monitoring: Secondary | ICD-10-CM | POA: Insufficient documentation

## 2014-05-11 NOTE — Assessment & Plan Note (Signed)
Well managed with Colcace bid.

## 2014-05-11 NOTE — Assessment & Plan Note (Signed)
Managed with Cymbalta 30mg. Her usual state of health. She smiles, eats, sleep as usual. No complaints. She participates therapy or facility activity when she desires  

## 2014-05-11 NOTE — Assessment & Plan Note (Signed)
Controlled, continue Metoprolol 12.5mg daily.    

## 2014-05-11 NOTE — Assessment & Plan Note (Signed)
Pain is controlled with Tylenol 500mg bid and Norco 5/325 1/2 qhs.     

## 2014-05-11 NOTE — Progress Notes (Signed)
Patient ID: [name], female   DOB: 1914-09-22, 80 y.o.   MRN: [id number]   Code Status: DNR  Allergies  Allergen Reactions  . Amiodarone   . Penicillins   . Amoxicillin   . Celecoxib   . Ciprofloxacin   . Codeine   . Diltiazem   . Lactobacillus [Acidophilus Lactobacillus]   . Metoprolol Succinate     All Beta-blockers  . Metronidazole Hcl   . Other     floricene dye netribudazike    Chief Complaint  Patient presents with  . Medical Management of Chronic Issues  . Acute Visit    R knee pain.     HPI: Patient is a 80 y.o. female seen in the SNF at [facility] today for evaluation of R knee pain and chronic medical conditions.  Problem List Items Addressed This Visit   Pain in joint, shoulder region (Chronic)     Pain is controlled with Tylenol 500mg  bid and Norco 5/325 1/2 qhs.     Osteoarthritis of right knee - Primary     Worsened pain with mild heat in the right knee. ROM has no change. Continue Doxy. Declined additional analgesic for pain.     Major depressive disorder, recurrent episode, severe     Managed with Cymbalta 30mg . Her usual state of health. She smiles, eats, sleep as usual. No complaints. She participates therapy or facility activity when she desires       Essential hypertension     Controlled, continue Metoprolol 12.5mg  daily.        Constipation     Well managed with Colcace bid.     Congestive heart failure     Compensated clinically, edema-mainly in LLE-1+ since resumed Furosemide and Kcl. Observe the patient.        Atrial fibrillation     Rate controlled, continue anticoagulation with Coumadin . Takes Metoprolol 12.5mg  daily. INR therapeutic presently.           Review of Systems:  Review of Systems  Constitutional: Negative for fever, chills, weight loss, malaise/fatigue and diaphoresis.  HENT: Positive for hearing loss. Negative for congestion, ear discharge, ear pain and sore throat.   Eyes: Negative for  blurred vision, pain, discharge and redness.  Respiratory: Negative for cough, sputum production, shortness of breath and wheezing.   Cardiovascular: Positive for leg swelling. Negative for chest pain, palpitations, orthopnea, claudication and PND.       LLE 1+, RLL trace  Gastrointestinal: Negative for nausea, vomiting, abdominal pain, diarrhea, constipation and blood in stool.  Genitourinary: Positive for frequency. Negative for dysuria, urgency and flank pain.  Musculoskeletal: Positive for joint pain. Negative for back pain and neck pain.       Decreased ROM of the R+L shoulders for over head movement. C/o worsened R knee pain with mild heat.   Skin: Negative for itching and rash.       No warmth to L knee today. Mild heat R knee today.   Neurological: Negative for tremors, sensory change, speech change, focal weakness, seizures, loss of consciousness, weakness and headaches.  Endo/Heme/Allergies: Negative for environmental allergies and polydipsia. Does not bruise/bleed easily.  Psychiatric/Behavioral: Positive for memory loss. Negative for hallucinations. The patient is not nervous/anxious and does not have insomnia.      Past Medical History  Diagnosis Date  . Anemia   . Arthritis   . CHF (congestive heart failure)   . Depression   . Hypertension   . Chronic  kidney disease   . Thyroid disease    History reviewed. No pertinent past surgical history. Social History:   has no tobacco, alcohol, and drug history on file.  No family history on file.  Medications: Patient's Medications  New Prescriptions   No medications on file  Previous Medications   ACETAMINOPHEN (TYLENOL) 325 MG TABLET    Take 325 mg by mouth every 6 (six) hours as needed.     ACETAMINOPHEN (TYLENOL) 500 MG TABLET    Take 1 caplet by mouth twice daily   BETA CAROTENE W/MINERALS (OCUVITE) TABLET    Take 1 tablet by mouth daily.     CALCIUM-VITAMIN D (OSCAL WITH D) 500-200 MG-UNIT PER TABLET    Take 1 tablet  by mouth 2 (two) times daily.     CAMPHOR-EUCALYPTUS-MENTHOL (VICKS VAPORUB) 4.7-1.2-2.6 % OINT    Apply topically. Rub on chest as directed  (may keep at bedside)   DOCUSATE SODIUM (COLACE) 100 MG CAPSULE    Take 100 mg by mouth 2 (two) times daily.     DOXYCYCLINE (VIBRAMYCIN) 100 MG CAPSULE    Take 100 mg by mouth 2 (two) times daily.   DULOXETINE (CYMBALTA) 30 MG CAPSULE    Take 30 mg by mouth daily.     FOLIC ACID (FOLVITE) 1 MG TABLET    Take 1 mg by mouth daily.     FUROSEMIDE (LASIX) 20 MG TABLET    Take 20 mg by mouth daily. ONE DAY 20 NEXT DAY 40 AND ALTERNATE.   HYDROCODONE-ACETAMINOPHEN (NORCO/VICODIN) 5-325 MG PER TABLET    Take 1/2 tablet by mouth at bedtime   LEVOTHYROXINE (SYNTHROID, LEVOTHROID) 50 MCG TABLET    Take 50 mcg by mouth daily before breakfast.   MAGNESIUM OXIDE (MAG-OX) 400 MG TABLET    Take 400 mg by mouth daily.     METOPROLOL SUCCINATE (TOPROL-XL) 25 MG 24 HR TABLET    Take 25 mg by mouth daily. Taking 1/2 tab    MULTIPLE VITAMIN (MULTIVITAMIN) TABLET    Take 1 tablet by mouth daily.     POTASSIUM CHLORIDE (MICRO-K) 10 MEQ CR CAPSULE    Take 10 mEq by mouth daily. Takes 20 meq (2) 10 meq daily    SENNOSIDES-DOCUSATE SODIUM (SENOKOT-S) 8.6-50 MG TABLET    Take by mouth daily.     WARFARIN (COUMADIN) 4 MG TABLET    Take 4 mg by mouth daily. Taking 4 mg alt with 5mg   Modified Medications   No medications on file  Discontinued Medications   No medications on file     Physical Exam: Physical Exam  Constitutional: She is oriented to person, place, and time. She appears well-developed and well-nourished. No distress.  HENT:  Head: Normocephalic and atraumatic.  Eyes: Conjunctivae and EOM are normal. Pupils are equal, round, and reactive to light.  Neck: Normal range of motion. Neck supple. No JVD present. No thyromegaly present.  Cardiovascular: Normal rate.  Frequent extrasystoles are present.  Murmur heard. Pulses:      Dorsalis pedis pulses are 2+ on the  right side, and 1+ on the left side.  EM 2/6  Pulmonary/Chest: She has no wheezes. She has no rales. She exhibits no tenderness.  Occasional expiratory wheezes at the posterior lower lungs-resolved.   Abdominal: Soft. Bowel sounds are normal.  Musculoskeletal: She exhibits edema (left knee) and tenderness.       Left shoulder: She exhibits decreased range of motion, tenderness, crepitus and pain.  Legs: Anterior distal femur 2x2cm firm nodule palpated, fixed, hard, non tender. Reduced ROM of the R+L shoulder for over head activities. Left wrist/left shoulder/left knee pain on and off. Crepitus to L shoulder, endorses paint with ROM. LLE 1+. RLE trace. Worsened R knee pain with mild heat.   Lymphadenopathy:    She has no cervical adenopathy.  Neurological: She is alert and oriented to person, place, and time. She has normal reflexes. No cranial nerve deficit. She exhibits normal muscle tone. Coordination normal.  Skin: Skin is dry. No rash noted. She is not diaphoretic. There is erythema (left knee).  baseline of mild heat of the left knee. Dark pigmented anterior RLE. Worsened R knee pain with mild heat.    Psychiatric: She has a normal mood and affect. Her behavior is normal. Judgment and thought content normal. Cognition and memory are impaired. She exhibits abnormal recent memory.     Filed Vitals:   05/09/14 1717  BP: 130/74  Pulse: 70  Temp: 96 F (35.6 C)  TempSrc: Tympanic  Resp: 22      Labs reviewed: Basic Metabolic Panel:  Recent Labs  07/04/13  01/05/14 02/04/14 02/13/14  NA 137  < > 138 135* 134*  K 4.4  < > 4.5 4.1 4.9  BUN 26*  < > 39* 26* 24*  CREATININE 1.1  < > 1.2* 1.2* 1.2*  TSH 3.34  --  4.14  --   --   < > = values in this interval not displayed. Liver Function Tests:  Recent Labs  07/04/13 01/05/14 02/13/14  AST 29 20 17   ALT 19 15 12   ALKPHOS 49 57 60   No results found for this basename: LIPASE, AMYLASE,  in the last 8760 hours No results  found for this basename: AMMONIA,  in the last 8760 hours CBC:  Recent Labs  01/16/14 02/04/14 02/13/14  WBC 21.2 31.7 29.3  HGB 11.0* 11.9* 11.3*  HCT 33* 34* 33*  PLT 100* 99* 130*   Lipid Panel: No results found for this basename: CHOL, HDL, LDLCALC, TRIG, CHOLHDL, LDLDIRECT,  in the last 8760 hours  Past Procedures:  n/a   Assessment/Plan Osteoarthritis of right knee Worsened pain with mild heat in the right knee. ROM has no change. Continue Doxy. Declined additional analgesic for pain.   Atrial fibrillation Rate controlled, continue anticoagulation with Coumadin . Takes Metoprolol 12.5mg  daily. INR therapeutic presently.      Congestive heart failure Compensated clinically, edema-mainly in LLE-1+ since resumed Furosemide and Kcl. Observe the patient.      Major depressive disorder, recurrent episode, severe Managed with Cymbalta 30mg . Her usual state of health. She smiles, eats, sleep as usual. No complaints. She participates therapy or facility activity when she desires     Pain in joint, shoulder region Pain is controlled with Tylenol 500mg  bid and Norco 5/325 1/2 qhs.   Essential hypertension Controlled, continue Metoprolol 12.5mg  daily.      Constipation Well managed with Colcace bid.     Family/ Staff Communication: observe the patient.   Goals of Care: SNF  Labs/tests ordered: none

## 2014-05-11 NOTE — Progress Notes (Signed)
Patient ID: [name], female   DOB: 20-Mar-1915, 80 y.o.   MRN: [id number]     [facility]  Allergies  Allergen Reactions  . Amiodarone   . Penicillins   . Amoxicillin   . Celecoxib   . Ciprofloxacin   . Codeine   . Diltiazem   . Lactobacillus [Acidophilus Lactobacillus]   . Metoprolol Succinate     All Beta-blockers  . Metronidazole Hcl   . Other     floricene dye netribudazike     Chief Complaint  Patient presents with  . Medical Management of Chronic Issues    HPI:  She is a long term resident of this facility being seen for the management of her chronic illnesses. Her inr today is 2.48 and she is on alternating doses of coumadin 4 mg and 5 mg daily for afib and pe. There are no concerns being voiced by the nursing staff at this time. She is not voicing any concerns at this time.    Past Medical History  Diagnosis Date  . Anemia   . Arthritis   . CHF (congestive heart failure)   . Depression   . Hypertension   . Chronic kidney disease   . Thyroid disease     No past surgical history on file.  VITAL SIGNS BP 136/62  Pulse 70  Ht 5' 4.5" (1.638 m)  Wt 197 lb 8 oz (89.585 kg)  BMI 33.39 kg/m2   Patient's Medications  New Prescriptions   No medications on file  Previous Medications   ACETAMINOPHEN (TYLENOL) 325 MG TABLET    Take 325 mg by mouth every 6 (six) hours as needed.     ACETAMINOPHEN (TYLENOL) 500 MG TABLET    Take 1 caplet by mouth twice daily   BETA CAROTENE W/MINERALS (OCUVITE) TABLET    Take 1 tablet by mouth daily.     CALCIUM-VITAMIN D (OSCAL WITH D) 500-200 MG-UNIT PER TABLET    Take 1 tablet by mouth 2 (two) times daily.     CAMPHOR-EUCALYPTUS-MENTHOL (VICKS VAPORUB) 4.7-1.2-2.6 % OINT    Apply topically. Rub on chest as directed  (may keep at bedside)   DOCUSATE SODIUM (COLACE) 100 MG CAPSULE    Take 100 mg by mouth 2 (two) times daily.     DOXYCYCLINE (VIBRAMYCIN) 100 MG CAPSULE    Take 100 mg by mouth 2 (two) times  daily.   DULOXETINE (CYMBALTA) 30 MG CAPSULE    Take 30 mg by mouth daily.     FOLIC ACID (FOLVITE) 1 MG TABLET    Take 1 mg by mouth daily.     FUROSEMIDE (LASIX) 20 MG TABLET    Take 20 mg by mouth daily. ONE DAY 20 NEXT DAY 40 AND ALTERNATE.   HYDROCODONE-ACETAMINOPHEN (NORCO/VICODIN) 5-325 MG PER TABLET    Take 1/2 tablet by mouth at bedtime   LEVOTHYROXINE (SYNTHROID, LEVOTHROID) 50 MCG TABLET    Take 50 mcg by mouth daily before breakfast.   MAGNESIUM OXIDE (MAG-OX) 400 MG TABLET    Take 400 mg by mouth daily.     METOPROLOL SUCCINATE (TOPROL-XL) 25 MG 24 HR TABLET    Take 25 mg by mouth daily. Taking 1/2 tab    MULTIPLE VITAMIN (MULTIVITAMIN) TABLET    Take 1 tablet by mouth daily.     POTASSIUM CHLORIDE (MICRO-K) 10 MEQ CR CAPSULE    Take 10 mEq by mouth daily. Takes 20 meq (2) 10 meq daily    SENNOSIDES-DOCUSATE  SODIUM (SENOKOT-S) 8.6-50 MG TABLET    Take by mouth daily.     WARFARIN (COUMADIN) 4 MG TABLET    Take 4 mg by mouth daily. Taking 4 mg alt with 5mg   Modified Medications   No medications on file  Discontinued Medications   No medications on file    SIGNIFICANT DIAGNOSTIC EXAMS  03-06-14: left lower extremity doppler: negative for dvt    LABS REVIEWED:  02-13-14: wbc 29.3; hgb 11.3; hct 32.9; mcv 83.5; plt 130;  03-02-14: glucose 98; bun 13; creat 1.6; k+4.5; na++136    Review of Systems  Constitutional: Negative for malaise/fatigue.  Respiratory: Negative for cough and shortness of breath.   Cardiovascular: Negative for chest pain.  Gastrointestinal: Negative for heartburn and abdominal pain.  Musculoskeletal: Negative for joint pain and myalgias.  Skin: Negative.   Psychiatric/Behavioral: The patient is not nervous/anxious.       Physical Exam  Constitutional: She appears well-developed and well-nourished. No distress.  obese  Neck: Neck supple. No JVD present. No thyromegaly present.  Cardiovascular: Normal rate, regular rhythm and intact distal pulses.     Respiratory: Effort normal and breath sounds normal. No respiratory distress. She has no wheezes.  GI: Soft. Bowel sounds are normal. She exhibits no distension. There is no tenderness.  Musculoskeletal:  Is able to move all extremities No edema present.   Neurological: She is alert.  Skin: Skin is warm and dry. She is not diaphoretic.      ASSESSMENT/ PLAN:  1. Afib: her heart rate is regular at this time; will continue lopressor 12.5 mg daily; is on long term coumadin therapy.  Will not make changes will monitor her status.   2. Hypertension: is stable will continue lopressor 12.5 mg daily will monitor   3. PE: is stable will continue her long term coumadin therapy.   4. Anticoagulation therapy management: for her inr of 2.48 will continue her alternating doses of coumadin 4 mg and 5 mg and will check inr in 2 weeks.   5. CHF: she is stable will continue her on alternating doses of lasix 20 mg and 40 mg every other day with k+ 20 meq daily and will monitor has roxanol 5 mg every 2 hours as needed   6. Hypothyroidism: will continue synthroid 50 mcg daily   7. Constipation: will continue colace twice daily   8. Osteoarthritis right knee: she is stable will continue tylenol 500 mg twice daily and vicodoin 5/325 mg nightly and takes cymbalta 30 mg daily  for pain management will monitor   9. UTI: no recent acute infection present is on long term vibramycin 100 mg twice daily therapy will monitor   10. Anxiety: will continue cymbalta 30 mg daily and has ativan 0.5 mg every 2 hours as needed    Ok Edwards NP [facility]  Contact [phone] Monday through Friday 8am- 5pm  After hours call [phone]

## 2014-05-11 NOTE — Assessment & Plan Note (Signed)
Rate controlled, continue anticoagulation with Coumadin . Takes Metoprolol 12.5mg daily. INR therapeutic presently.   

## 2014-05-11 NOTE — Assessment & Plan Note (Signed)
Worsened pain with mild heat in the right knee. ROM has no change. Continue Doxy. Declined additional analgesic for pain.   

## 2014-05-11 NOTE — Assessment & Plan Note (Signed)
Compensated clinically, edema-mainly in LLE-1+ since resumed Furosemide and Kcl. Observe the patient.

## 2014-05-16 ENCOUNTER — Encounter: Payer: Self-pay | Admitting: Nurse Practitioner

## 2014-05-16 ENCOUNTER — Non-Acute Institutional Stay (SKILLED_NURSING_FACILITY): Payer: Medicare Other | Admitting: Nurse Practitioner

## 2014-05-16 DIAGNOSIS — I482 Chronic atrial fibrillation, unspecified: Secondary | ICD-10-CM

## 2014-05-16 DIAGNOSIS — K5902 Outlet dysfunction constipation: Secondary | ICD-10-CM

## 2014-05-16 DIAGNOSIS — J309 Allergic rhinitis, unspecified: Secondary | ICD-10-CM

## 2014-05-16 DIAGNOSIS — F332 Major depressive disorder, recurrent severe without psychotic features: Secondary | ICD-10-CM

## 2014-05-16 DIAGNOSIS — M1711 Unilateral primary osteoarthritis, right knee: Secondary | ICD-10-CM

## 2014-05-16 DIAGNOSIS — I509 Heart failure, unspecified: Secondary | ICD-10-CM

## 2014-05-16 DIAGNOSIS — Z5181 Encounter for therapeutic drug level monitoring: Secondary | ICD-10-CM

## 2014-05-16 DIAGNOSIS — M25511 Pain in right shoulder: Secondary | ICD-10-CM

## 2014-05-16 DIAGNOSIS — Z7901 Long term (current) use of anticoagulants: Secondary | ICD-10-CM

## 2014-05-16 DIAGNOSIS — E039 Hypothyroidism, unspecified: Secondary | ICD-10-CM

## 2014-05-16 DIAGNOSIS — I10 Essential (primary) hypertension: Secondary | ICD-10-CM

## 2014-05-16 NOTE — Assessment & Plan Note (Signed)
Pain is controlled with Tylenol 500mg bid and Norco 5/325 1/2 qhs.     

## 2014-05-16 NOTE — Assessment & Plan Note (Signed)
Controlled, continue Metoprolol 12.5mg daily.    

## 2014-05-16 NOTE — Assessment & Plan Note (Signed)
Takes Levothyroxine 50mcg, last TSH 4.137 01/05/14  

## 2014-05-16 NOTE — Assessment & Plan Note (Signed)
Managed with Cymbalta 30mg. Her usual state of health. She smiles, eats, sleep as usual. No complaints. She participates therapy or facility activity when she desires  

## 2014-05-16 NOTE — Assessment & Plan Note (Signed)
Nasal congestion, clear nasal drainage, c/o scratchy throat and hacking cough occasionally. Denied chest pain, facial pressure, or sputum production. She is afebrile. Will manage her symptoms with Claritin 10mg  and Mucinex 600mg  bid x 5 days. Observe the patient.

## 2014-05-16 NOTE — Assessment & Plan Note (Signed)
Rate controlled, continue anticoagulation with Coumadin . Takes Metoprolol 12.5mg daily. INR therapeutic presently.   

## 2014-05-16 NOTE — Progress Notes (Signed)
Patient ID: [name], female   DOB: [birth year], 80 y.o.   MRN: [id number]   Code Status: DNR  Allergies  Allergen Reactions  . Amiodarone   . Penicillins   . Amoxicillin   . Celecoxib   . Ciprofloxacin   . Codeine   . Diltiazem   . Lactobacillus [Acidophilus Lactobacillus]   . Metoprolol Succinate     All Beta-blockers  . Metronidazole Hcl   . Other     floricene dye netribudazike    Chief Complaint  Patient presents with  . Medical Management of Chronic Issues  . Acute Visit    nasal congestion, scratchy throat, hacking cough.     HPI: Patient is a 80 y.o. female seen in the SNF at [facility] today for evaluation of nasal congestion, scratchy throat, hacking cough, and chronic medical conditions.  Problem List Items Addressed This Visit   Pain in joint, shoulder region (Chronic)     Pain is controlled with Tylenol 500mg  bid and Norco 5/325 1/2 qhs.     Osteoarthritis of right knee     Worsened pain with mild heat in the right knee. ROM has no change. Continue Doxy. Declined additional analgesic for pain.      Major depressive disorder, recurrent episode, severe     Managed with Cymbalta 30mg . Her usual state of health. She smiles, eats, sleep as usual. No complaints. She participates therapy or facility activity when she desires      Hypothyroidism     Takes Levothyroxine 81mcg, last TSH 4.137 01/05/14       Essential hypertension     Controlled, continue Metoprolol 12.5mg  daily.       Constipation     Well managed with Colcace bid.     Congestive heart failure     Compensated clinically, edema-mainly in LLE-1+ since resumed Furosemide and Kcl. Observe the patient.       Atrial fibrillation     Rate controlled, continue anticoagulation with Coumadin . Takes Metoprolol 12.5mg  daily. INR therapeutic presently.        Anticoagulation management encounter     Adjusting Coumadin per PT/INR. Goal is 2-3    Allergic rhinitis - Primary       Nasal congestion, clear nasal drainage, c/o scratchy throat and hacking cough occasionally. Denied chest pain, facial pressure, or sputum production. She is afebrile. Will manage her symptoms with Claritin 10mg  and Mucinex 600mg  bid x 5 days. Observe the patient.        Review of Systems:  Review of Systems  Constitutional: Negative for fever, chills, weight loss, malaise/fatigue and diaphoresis.  HENT: Positive for hearing loss. Negative for congestion, ear discharge, ear pain and sore throat.        Nasal congestion, scratchy throat, hacking cough.   Eyes: Negative for blurred vision, pain, discharge and redness.  Respiratory: Positive for cough. Negative for sputum production, shortness of breath and wheezing.        Hacking cough occasionally.   Cardiovascular: Positive for leg swelling. Negative for chest pain, palpitations, orthopnea, claudication and PND.       LLE 1+, RLL trace  Gastrointestinal: Negative for nausea, vomiting, abdominal pain, diarrhea, constipation and blood in stool.  Genitourinary: Positive for frequency. Negative for dysuria, urgency and flank pain.  Musculoskeletal: Positive for joint pain. Negative for back pain and neck pain.       Decreased ROM of the R+L shoulders for over head movement. C/o worsened R knee pain  with mild heat.   Skin: Negative for itching and rash.       No warmth to L knee today. Mild heat R knee today.   Neurological: Negative for tremors, sensory change, speech change, focal weakness, seizures, loss of consciousness, weakness and headaches.  Endo/Heme/Allergies: Negative for environmental allergies and polydipsia. Does not bruise/bleed easily.  Psychiatric/Behavioral: Positive for memory loss. Negative for hallucinations. The patient is not nervous/anxious and does not have insomnia.      Past Medical History  Diagnosis Date  . Anemia   . Arthritis   . CHF (congestive heart failure)   . Depression   . Hypertension   .  Chronic kidney disease   . Thyroid disease    History reviewed. No pertinent past surgical history. Social History:   has no tobacco, alcohol, and drug history on file.  No family history on file.  Medications: Patient's Medications  New Prescriptions   No medications on file  Previous Medications   ACETAMINOPHEN (TYLENOL) 325 MG TABLET    Take 325 mg by mouth every 6 (six) hours as needed.     ACETAMINOPHEN (TYLENOL) 500 MG TABLET    Take 1 caplet by mouth twice daily   BETA CAROTENE W/MINERALS (OCUVITE) TABLET    Take 1 tablet by mouth daily.     CALCIUM-VITAMIN D (OSCAL WITH D) 500-200 MG-UNIT PER TABLET    Take 1 tablet by mouth 2 (two) times daily.     CAMPHOR-EUCALYPTUS-MENTHOL (VICKS VAPORUB) 4.7-1.2-2.6 % OINT    Apply topically. Rub on chest as directed  (may keep at bedside)   DOCUSATE SODIUM (COLACE) 100 MG CAPSULE    Take 100 mg by mouth 2 (two) times daily.     DOXYCYCLINE (VIBRAMYCIN) 100 MG CAPSULE    Take 100 mg by mouth 2 (two) times daily.   DULOXETINE (CYMBALTA) 30 MG CAPSULE    Take 30 mg by mouth daily.     FOLIC ACID (FOLVITE) 1 MG TABLET    Take 1 mg by mouth daily.     FUROSEMIDE (LASIX) 20 MG TABLET    Take 20 mg by mouth daily. ONE DAY 20 NEXT DAY 40 AND ALTERNATE.   HYDROCODONE-ACETAMINOPHEN (NORCO/VICODIN) 5-325 MG PER TABLET    Take 1/2 tablet by mouth at bedtime   LEVOTHYROXINE (SYNTHROID, LEVOTHROID) 50 MCG TABLET    Take 50 mcg by mouth daily before breakfast.   MAGNESIUM OXIDE (MAG-OX) 400 MG TABLET    Take 400 mg by mouth daily.     METOPROLOL SUCCINATE (TOPROL-XL) 25 MG 24 HR TABLET    Take 25 mg by mouth daily. Taking 1/2 tab    MULTIPLE VITAMIN (MULTIVITAMIN) TABLET    Take 1 tablet by mouth daily.     POTASSIUM CHLORIDE (MICRO-K) 10 MEQ CR CAPSULE    Take 10 mEq by mouth daily. Takes 20 meq (2) 10 meq daily    SENNOSIDES-DOCUSATE SODIUM (SENOKOT-S) 8.6-50 MG TABLET    Take by mouth daily.     WARFARIN (COUMADIN) 4 MG TABLET    Take 4 mg by mouth  daily. Taking 4 mg alt with 5mg   Modified Medications   No medications on file  Discontinued Medications   No medications on file     Physical Exam: Physical Exam  Constitutional: She is oriented to person, place, and time. She appears well-developed and well-nourished. No distress.  HENT:  Head: Normocephalic and atraumatic.  Clear nasal drainage seen  Eyes: Conjunctivae and EOM are normal.  Pupils are equal, round, and reactive to light.  Neck: Normal range of motion. Neck supple. No JVD present. No thyromegaly present.  Cardiovascular: Normal rate.  Frequent extrasystoles are present.  Murmur heard. Pulses:      Dorsalis pedis pulses are 2+ on the right side, and 1+ on the left side.  EM 2/6  Pulmonary/Chest: She has no wheezes. She has no rales. She exhibits no tenderness.  Occasional expiratory wheezes at the posterior lower lungs-resolved.   Abdominal: Soft. Bowel sounds are normal.  Musculoskeletal: She exhibits edema (left knee) and tenderness.       Left shoulder: She exhibits decreased range of motion, tenderness, crepitus and pain.       Legs: Anterior distal femur 2x2cm firm nodule palpated, fixed, hard, non tender. Reduced ROM of the R+L shoulder for over head activities. Left wrist/left shoulder/left knee pain on and off. Crepitus to L shoulder, endorses paint with ROM. LLE 1+. RLE trace. Worsened R knee pain with mild heat.   Lymphadenopathy:    She has no cervical adenopathy.  Neurological: She is alert and oriented to person, place, and time. She has normal reflexes. No cranial nerve deficit. She exhibits normal muscle tone. Coordination normal.  Skin: Skin is dry. No rash noted. She is not diaphoretic. There is erythema (left knee).  baseline of mild heat of the left knee. Dark pigmented anterior RLE. Worsened R knee pain with mild heat.    Psychiatric: She has a normal mood and affect. Her behavior is normal. Judgment and thought content normal. Cognition and  memory are impaired. She exhibits abnormal recent memory.     Filed Vitals:   05/16/14 1206  BP: 110/70  Pulse: 76  Temp: 98.4 F (36.9 C)  Resp: 16      Labs reviewed: Basic Metabolic Panel:  Recent Labs  07/04/13  01/05/14 02/04/14 02/13/14  NA 137  < > 138 135* 134*  K 4.4  < > 4.5 4.1 4.9  BUN 26*  < > 39* 26* 24*  CREATININE 1.1  < > 1.2* 1.2* 1.2*  TSH 3.34  --  4.14  --   --   < > = values in this interval not displayed. Liver Function Tests:  Recent Labs  07/04/13 01/05/14 02/13/14  AST 29 20 17   ALT 19 15 12   ALKPHOS 49 57 60   No results found for this basename: LIPASE, AMYLASE,  in the last 8760 hours No results found for this basename: AMMONIA,  in the last 8760 hours CBC:  Recent Labs  01/16/14 02/04/14 02/13/14  WBC 21.2 31.7 29.3  HGB 11.0* 11.9* 11.3*  HCT 33* 34* 33*  PLT 100* 99* 130*   Lipid Panel: No results found for this basename: CHOL, HDL, LDLCALC, TRIG, CHOLHDL, LDLDIRECT,  in the last 8760 hours  Past Procedures:  n/a   Assessment/Plan Allergic rhinitis Nasal congestion, clear nasal drainage, c/o scratchy throat and hacking cough occasionally. Denied chest pain, facial pressure, or sputum production. She is afebrile. Will manage her symptoms with Claritin 10mg  and Mucinex 600mg  bid x 5 days. Observe the patient.   Anticoagulation management encounter Adjusting Coumadin per PT/INR. Goal is 2-3  Atrial fibrillation Rate controlled, continue anticoagulation with Coumadin . Takes Metoprolol 12.5mg  daily. INR therapeutic presently.      Constipation Well managed with Colcace bid.   Congestive heart failure Compensated clinically, edema-mainly in LLE-1+ since resumed Furosemide and Kcl. Observe the patient.     Essential hypertension Controlled, continue  Metoprolol 12.5mg  daily.     Hypothyroidism Takes Levothyroxine 68mcg, last TSH 4.137 01/05/14     Major depressive disorder, recurrent episode, severe Managed  with Cymbalta 30mg . Her usual state of health. She smiles, eats, sleep as usual. No complaints. She participates therapy or facility activity when she desires    Osteoarthritis of right knee Worsened pain with mild heat in the right knee. ROM has no change. Continue Doxy. Declined additional analgesic for pain.    Pain in joint, shoulder region Pain is controlled with Tylenol 500mg  bid and Norco 5/325 1/2 qhs.     Family/ Staff Communication: observe the patient.   Goals of Care: SNF  Labs/tests ordered: none

## 2014-05-16 NOTE — Assessment & Plan Note (Signed)
Adjusting Coumadin per PT/INR. Goal is 2-3

## 2014-05-16 NOTE — Assessment & Plan Note (Signed)
Worsened pain with mild heat in the right knee. ROM has no change. Continue Doxy. Declined additional analgesic for pain.   

## 2014-05-16 NOTE — Assessment & Plan Note (Signed)
Well managed with Colcace bid.

## 2014-05-16 NOTE — Assessment & Plan Note (Signed)
Compensated clinically, edema-mainly in LLE-1+ since resumed Furosemide and Kcl. Observe the patient.

## 2014-05-26 ENCOUNTER — Non-Acute Institutional Stay (SKILLED_NURSING_FACILITY): Payer: Medicare Other | Admitting: Nurse Practitioner

## 2014-05-26 ENCOUNTER — Encounter: Payer: Self-pay | Admitting: Nurse Practitioner

## 2014-05-26 DIAGNOSIS — E039 Hypothyroidism, unspecified: Secondary | ICD-10-CM

## 2014-05-26 DIAGNOSIS — K5900 Constipation, unspecified: Secondary | ICD-10-CM

## 2014-05-26 DIAGNOSIS — I509 Heart failure, unspecified: Secondary | ICD-10-CM

## 2014-05-26 DIAGNOSIS — J069 Acute upper respiratory infection, unspecified: Secondary | ICD-10-CM | POA: Insufficient documentation

## 2014-05-26 DIAGNOSIS — I482 Chronic atrial fibrillation, unspecified: Secondary | ICD-10-CM

## 2014-05-26 DIAGNOSIS — I10 Essential (primary) hypertension: Secondary | ICD-10-CM

## 2014-05-26 DIAGNOSIS — J309 Allergic rhinitis, unspecified: Secondary | ICD-10-CM

## 2014-05-26 DIAGNOSIS — F332 Major depressive disorder, recurrent severe without psychotic features: Secondary | ICD-10-CM

## 2014-05-26 DIAGNOSIS — M1711 Unilateral primary osteoarthritis, right knee: Secondary | ICD-10-CM

## 2014-05-26 DIAGNOSIS — Z5181 Encounter for therapeutic drug level monitoring: Secondary | ICD-10-CM

## 2014-05-26 DIAGNOSIS — Z7901 Long term (current) use of anticoagulants: Secondary | ICD-10-CM

## 2014-05-26 DIAGNOSIS — M25512 Pain in left shoulder: Secondary | ICD-10-CM

## 2014-05-26 NOTE — Assessment & Plan Note (Signed)
Pain is controlled with Tylenol 500mg bid and Norco 5/325 1/2 qhs.     

## 2014-05-26 NOTE — Assessment & Plan Note (Signed)
Well managed with Colcace bid.

## 2014-05-26 NOTE — Assessment & Plan Note (Signed)
Persisted nasal congestion and hacking cough. Little response to 5 day course of Claritin and Mucinex. Will obtain CXR to r/o PNA. Avelox 400mg  daily x 10days and resume Claritin 10mg  daily x 2 weeks.

## 2014-05-26 NOTE — Progress Notes (Signed)
Patient ID: [name], female   DOB: 1915/03/06, 80 y.o.   MRN: [id number]   Code Status: DNR  Allergies  Allergen Reactions  . Amiodarone   . Penicillins   . Amoxicillin   . Celecoxib   . Ciprofloxacin   . Codeine   . Diltiazem   . Lactobacillus [Acidophilus Lactobacillus]   . Metoprolol Succinate     All Beta-blockers  . Metronidazole Hcl   . Other     floricene dye netribudazike    Chief Complaint  Patient presents with  . Medical Management of Chronic Issues  . Acute Visit    cough    HPI: Patient is a 80 y.o. female seen in the SNF at [facility] today for evaluation of cough and chronic medical conditions.  Problem List Items Addressed This Visit   Pain in joint, shoulder region (Chronic)     Pain is controlled with Tylenol 500mg  bid and Norco 5/325 1/2 qhs.      Osteoarthritis of right knee     Baseline aches and resolved erythema. Continue Doxy.       Major depressive disorder, recurrent episode, severe     Managed with Cymbalta 30mg . Her usual state of health. She smiles, eats, sleep as usual. No complaints. She participates therapy or facility activity when she desires       Hypothyroidism     Takes Levothyroxine 39mcg, last TSH 4.137 01/05/14       Essential hypertension     Controlled, continue Metoprolol 12.5mg  daily.       Constipation     Well managed with Colcace bid.      Congestive heart failure     Compensated clinically, edema-mainly in LLE-1+ since resumed Furosemide and Kcl. Observe the patient.        Atrial fibrillation     Rate controlled, continue anticoagulation with Coumadin . Takes Metoprolol 12.5mg  daily. INR therapeutic presently.      Anticoagulation management encounter     PT/INR next Monday. 05/29/14    Allergic rhinitis     Nasal congestion, clear nasal drainage, c/o scratchy throat and hacking cough occasionally. Denied chest pain, facial pressure, or sputum production. She is afebrile.  Persisted her symptoms upon completion of Claritin 10mg  and Mucinex 600mg  bid x 5 days.  05/26/14 Will add Avelox and obtain CXR to r/o PNA    Acute upper respiratory infection - Primary     Persisted nasal congestion and hacking cough. Little response to 5 day course of Claritin and Mucinex. Will obtain CXR to r/o PNA. Avelox 400mg  daily x 10days and resume Claritin 10mg  daily x 2 weeks.        Review of Systems:  Review of Systems  Constitutional: Negative for fever, chills, weight loss, malaise/fatigue and diaphoresis.  HENT: Positive for hearing loss. Negative for congestion, ear discharge, ear pain and sore throat.        Persisted hacking cough and nasal congestion.   Eyes: Negative for blurred vision, pain, discharge and redness.  Respiratory: Positive for cough. Negative for sputum production, shortness of breath and wheezing.        Hacking cough occasionally.   Cardiovascular: Positive for leg swelling. Negative for chest pain, palpitations, orthopnea, claudication and PND.       LLE 1+, RLL trace  Gastrointestinal: Negative for nausea, vomiting, abdominal pain, diarrhea, constipation and blood in stool.  Genitourinary: Positive for frequency. Negative for dysuria, urgency and flank pain.  Musculoskeletal: Positive for  joint pain. Negative for back pain and neck pain.       Decreased ROM of the R+L shoulders for over head movement. C/o worsened R knee pain with mild heat.   Skin: Negative for itching and rash.       No warmth to L knee today. Mild heat R knee today.   Neurological: Negative for tremors, sensory change, speech change, focal weakness, seizures, loss of consciousness, weakness and headaches.  Endo/Heme/Allergies: Negative for environmental allergies and polydipsia. Does not bruise/bleed easily.  Psychiatric/Behavioral: Positive for memory loss. Negative for hallucinations. The patient is not nervous/anxious and does not have insomnia.      Past Medical History    Diagnosis Date  . Anemia   . Arthritis   . CHF (congestive heart failure)   . Depression   . Hypertension   . Chronic kidney disease   . Thyroid disease    History reviewed. No pertinent past surgical history. Social History:   has no tobacco, alcohol, and drug history on file.  No family history on file.  Medications: Patient's Medications  New Prescriptions   No medications on file  Previous Medications   ACETAMINOPHEN (TYLENOL) 325 MG TABLET    Take 325 mg by mouth every 6 (six) hours as needed.     ACETAMINOPHEN (TYLENOL) 500 MG TABLET    Take 1 caplet by mouth twice daily   BETA CAROTENE W/MINERALS (OCUVITE) TABLET    Take 1 tablet by mouth daily.     CALCIUM-VITAMIN D (OSCAL WITH D) 500-200 MG-UNIT PER TABLET    Take 1 tablet by mouth 2 (two) times daily.     CAMPHOR-EUCALYPTUS-MENTHOL (VICKS VAPORUB) 4.7-1.2-2.6 % OINT    Apply topically. Rub on chest as directed  (may keep at bedside)   DOCUSATE SODIUM (COLACE) 100 MG CAPSULE    Take 100 mg by mouth 2 (two) times daily.     DOXYCYCLINE (VIBRAMYCIN) 100 MG CAPSULE    Take 100 mg by mouth 2 (two) times daily.   DULOXETINE (CYMBALTA) 30 MG CAPSULE    Take 30 mg by mouth daily.     FOLIC ACID (FOLVITE) 1 MG TABLET    Take 1 mg by mouth daily.     FUROSEMIDE (LASIX) 20 MG TABLET    Take 20 mg by mouth daily. ONE DAY 20 NEXT DAY 40 AND ALTERNATE.   HYDROCODONE-ACETAMINOPHEN (NORCO/VICODIN) 5-325 MG PER TABLET    Take 1/2 tablet by mouth at bedtime   LEVOTHYROXINE (SYNTHROID, LEVOTHROID) 50 MCG TABLET    Take 50 mcg by mouth daily before breakfast.   MAGNESIUM OXIDE (MAG-OX) 400 MG TABLET    Take 400 mg by mouth daily.     METOPROLOL SUCCINATE (TOPROL-XL) 25 MG 24 HR TABLET    Take 25 mg by mouth daily. Taking 1/2 tab    MULTIPLE VITAMIN (MULTIVITAMIN) TABLET    Take 1 tablet by mouth daily.     POTASSIUM CHLORIDE (MICRO-K) 10 MEQ CR CAPSULE    Take 10 mEq by mouth daily. Takes 20 meq (2) 10 meq daily    SENNOSIDES-DOCUSATE  SODIUM (SENOKOT-S) 8.6-50 MG TABLET    Take by mouth daily.     WARFARIN (COUMADIN) 4 MG TABLET    Take 4 mg by mouth daily. Taking 4 mg alt with 5mg   Modified Medications   No medications on file  Discontinued Medications   No medications on file     Physical Exam: Physical Exam  Constitutional: She is oriented  to person, place, and time. She appears well-developed and well-nourished. No distress.  HENT:  Head: Normocephalic and atraumatic.  Clear nasal drainage seen  Eyes: Conjunctivae and EOM are normal. Pupils are equal, round, and reactive to light.  Neck: Normal range of motion. Neck supple. No JVD present. No thyromegaly present.  Cardiovascular: Normal rate.  Frequent extrasystoles are present.  Murmur heard. Pulses:      Dorsalis pedis pulses are 2+ on the right side, and 1+ on the left side.  EM 2/6  Pulmonary/Chest: She has no wheezes. She has no rales. She exhibits no tenderness.  Occasional expiratory wheezes at the posterior lower lungs-resolved.   Abdominal: Soft. Bowel sounds are normal.  Musculoskeletal: She exhibits edema (left knee) and tenderness.       Left shoulder: She exhibits decreased range of motion, tenderness, crepitus and pain.       Legs: Anterior distal femur 2x2cm firm nodule palpated, fixed, hard, non tender. Reduced ROM of the R+L shoulder for over head activities. Left wrist/left shoulder/left knee pain on and off. Crepitus to L shoulder, endorses paint with ROM. LLE 1+. RLE trace. Worsened R knee pain with mild heat.   Lymphadenopathy:    She has no cervical adenopathy.  Neurological: She is alert and oriented to person, place, and time. She has normal reflexes. No cranial nerve deficit. She exhibits normal muscle tone. Coordination normal.  Skin: Skin is dry. No rash noted. She is not diaphoretic. There is erythema (left knee).  baseline of mild heat of the left knee. Dark pigmented anterior RLE. Worsened R knee pain with mild heat.      Psychiatric: She has a normal mood and affect. Her behavior is normal. Judgment and thought content normal. Cognition and memory are impaired. She exhibits abnormal recent memory.     Filed Vitals:   05/26/14 1237  BP: 128/78  Pulse: 80  Temp: 98.9 F (37.2 C)  TempSrc: Tympanic  Resp: 20      Labs reviewed: Basic Metabolic Panel:  Recent Labs  07/04/13  01/05/14 02/04/14 02/13/14  NA 137  < > 138 135* 134*  K 4.4  < > 4.5 4.1 4.9  BUN 26*  < > 39* 26* 24*  CREATININE 1.1  < > 1.2* 1.2* 1.2*  TSH 3.34  --  4.14  --   --   < > = values in this interval not displayed. Liver Function Tests:  Recent Labs  07/04/13 01/05/14 02/13/14  AST 29 20 17   ALT 19 15 12   ALKPHOS 49 57 60   No results found for this basename: LIPASE, AMYLASE,  in the last 8760 hours No results found for this basename: AMMONIA,  in the last 8760 hours CBC:  Recent Labs  01/16/14 02/04/14 02/13/14  WBC 21.2 31.7 29.3  HGB 11.0* 11.9* 11.3*  HCT 33* 34* 33*  PLT 100* 99* 130*   Lipid Panel: No results found for this basename: CHOL, HDL, LDLCALC, TRIG, CHOLHDL, LDLDIRECT,  in the last 8760 hours  Past Procedures:  n/a   Assessment/Plan Allergic rhinitis Nasal congestion, clear nasal drainage, c/o scratchy throat and hacking cough occasionally. Denied chest pain, facial pressure, or sputum production. She is afebrile. Persisted her symptoms upon completion of Claritin 10mg  and Mucinex 600mg  bid x 5 days.  05/26/14 Will add Avelox and obtain CXR to r/o PNA  Acute upper respiratory infection Persisted nasal congestion and hacking cough. Little response to 5 day course of Claritin and Mucinex. Will  obtain CXR to r/o PNA. Avelox 400mg  daily x 10days and resume Claritin 10mg  daily x 2 weeks.   Pain in joint, shoulder region Pain is controlled with Tylenol 500mg  bid and Norco 5/325 1/2 qhs.    Osteoarthritis of right knee Baseline aches and resolved erythema. Continue Doxy.     Major  depressive disorder, recurrent episode, severe Managed with Cymbalta 30mg . Her usual state of health. She smiles, eats, sleep as usual. No complaints. She participates therapy or facility activity when she desires     Hypothyroidism Takes Levothyroxine 39mcg, last TSH 4.137 01/05/14     Essential hypertension Controlled, continue Metoprolol 12.5mg  daily.     Constipation Well managed with Colcace bid.    Congestive heart failure Compensated clinically, edema-mainly in LLE-1+ since resumed Furosemide and Kcl. Observe the patient.      Atrial fibrillation Rate controlled, continue anticoagulation with Coumadin . Takes Metoprolol 12.5mg  daily. INR therapeutic presently.    Anticoagulation management encounter PT/INR next Monday. 05/29/14    Family/ Staff Communication: observe the patient.   Goals of Care: SNF  Labs/tests ordered: CXR

## 2014-05-26 NOTE — Assessment & Plan Note (Signed)
Compensated clinically, edema-mainly in LLE-1+ since resumed Furosemide and Kcl. Observe the patient.

## 2014-05-26 NOTE — Assessment & Plan Note (Signed)
Controlled, continue Metoprolol 12.5mg daily.    

## 2014-05-26 NOTE — Assessment & Plan Note (Signed)
Rate controlled, continue anticoagulation with Coumadin . Takes Metoprolol 12.5mg daily. INR therapeutic presently.   

## 2014-05-26 NOTE — Assessment & Plan Note (Signed)
Takes Levothyroxine 50mcg, last TSH 4.137 01/05/14  

## 2014-05-26 NOTE — Assessment & Plan Note (Signed)
Managed with Cymbalta 30mg. Her usual state of health. She smiles, eats, sleep as usual. No complaints. She participates therapy or facility activity when she desires  

## 2014-05-26 NOTE — Assessment & Plan Note (Signed)
PT/INR next Monday. 05/29/14

## 2014-05-26 NOTE — Assessment & Plan Note (Signed)
Baseline aches and resolved erythema. Continue Doxy.    

## 2014-05-26 NOTE — Assessment & Plan Note (Signed)
Nasal congestion, clear nasal drainage, c/o scratchy throat and hacking cough occasionally. Denied chest pain, facial pressure, or sputum production. She is afebrile. Persisted her symptoms upon completion of Claritin 10mg  and Mucinex 600mg  bid x 5 days.  05/26/14 Will add Avelox and obtain CXR to r/o PNA

## 2014-05-30 ENCOUNTER — Encounter: Payer: Self-pay | Admitting: Nurse Practitioner

## 2014-05-30 ENCOUNTER — Non-Acute Institutional Stay (SKILLED_NURSING_FACILITY): Payer: Medicare Other | Admitting: Nurse Practitioner

## 2014-05-30 DIAGNOSIS — I482 Chronic atrial fibrillation, unspecified: Secondary | ICD-10-CM

## 2014-05-30 DIAGNOSIS — J069 Acute upper respiratory infection, unspecified: Secondary | ICD-10-CM

## 2014-05-30 DIAGNOSIS — M25519 Pain in unspecified shoulder: Secondary | ICD-10-CM

## 2014-05-30 DIAGNOSIS — I10 Essential (primary) hypertension: Secondary | ICD-10-CM

## 2014-05-30 DIAGNOSIS — F332 Major depressive disorder, recurrent severe without psychotic features: Secondary | ICD-10-CM

## 2014-05-30 DIAGNOSIS — E039 Hypothyroidism, unspecified: Secondary | ICD-10-CM

## 2014-05-30 DIAGNOSIS — I5022 Chronic systolic (congestive) heart failure: Secondary | ICD-10-CM

## 2014-05-30 NOTE — Assessment & Plan Note (Signed)
Pain is controlled with Tylenol 500mg bid and Norco 5/325 1/2 qhs.     

## 2014-05-30 NOTE — Assessment & Plan Note (Signed)
Managed with Cymbalta 30mg. Her usual state of health. She smiles, eats, sleep as usual. No complaints. She participates therapy or facility activity when she desires  

## 2014-05-30 NOTE — Progress Notes (Signed)
Patient ID: [name], female   DOB: 09/22/1914, 80 y.o.   MRN: [id number]   Code Status: DNR  Allergies  Allergen Reactions  . Amiodarone   . Penicillins   . Amoxicillin   . Celecoxib   . Ciprofloxacin   . Codeine   . Diltiazem   . Lactobacillus [Acidophilus Lactobacillus]   . Metoprolol Succinate     All Beta-blockers  . Metronidazole Hcl   . Other     floricene dye netribudazike    Chief Complaint  Patient presents with  . Medical Management of Chronic Issues  . Acute Visit    CHF    HPI: Patient is a 80 y.o. female seen in the SNF at [facility] today for evaluation of CHF and chronic medical conditions.  Problem List Items Addressed This Visit    Pain in joint, shoulder region (Chronic)    Pain is controlled with Tylenol 500mg  bid and Norco 5/325 1/2 qhs.      Major depressive disorder, recurrent episode, severe    Managed with Cymbalta 30mg . Her usual state of health. She smiles, eats, sleep as usual. No complaints. She participates therapy or facility activity when she desires      Hypothyroidism    Takes Levothyroxine 53mcg, last TSH 4.137 01/05/14      Essential hypertension    Controlled, continue Metoprolol 12.5mg  daily.      Congestive heart failure - Primary    05/26/14 CXR no acute infiltrate or pleural effusion, borderline to minimal cardiomegaly with pulmonary edema consistent with mild congestive heart failure.  05/30/14 increase Furosemide to 40mg  daily from 20mg /40mg  alternating dose prior. (05/26/14 furosemide 60mg  x2 days-cough was improved. Avelox dc'd). Update BMP and BNP    Atrial fibrillation    Rate controlled, continue anticoagulation with Coumadin . Takes Metoprolol 12.5mg  daily. INR therapeutic presently.       Acute upper respiratory infection    Respiratory symptoms were related to CHF which were improved after Furosemide 60mg  qd x2       Review of Systems:  Review of Systems  Constitutional: Negative for  fever, chills, weight loss, malaise/fatigue and diaphoresis.  HENT: Positive for hearing loss. Negative for congestion, ear discharge, ear pain and sore throat.        Persisted hacking cough and nasal congestion.   Eyes: Negative for blurred vision, pain, discharge and redness.  Respiratory: Positive for cough. Negative for sputum production, shortness of breath and wheezing.        Hacking cough occasionally.   Cardiovascular: Positive for leg swelling. Negative for chest pain, palpitations, orthopnea, claudication and PND.       LLE 1+, RLL trace  Gastrointestinal: Negative for nausea, vomiting, abdominal pain, diarrhea, constipation and blood in stool.  Genitourinary: Positive for frequency. Negative for dysuria, urgency and flank pain.  Musculoskeletal: Positive for joint pain. Negative for back pain and neck pain.       Decreased ROM of the R+L shoulders for over head movement. C/o worsened R knee pain with mild heat.   Skin: Negative for itching and rash.       No warmth to L knee today. Mild heat R knee today.   Neurological: Negative for tremors, sensory change, speech change, focal weakness, seizures, loss of consciousness, weakness and headaches.  Endo/Heme/Allergies: Negative for environmental allergies and polydipsia. Does not bruise/bleed easily.  Psychiatric/Behavioral: Positive for memory loss. Negative for hallucinations. The patient is not nervous/anxious and does not have insomnia.  Past Medical History  Diagnosis Date  . Anemia   . Arthritis   . CHF (congestive heart failure)   . Depression   . Hypertension   . Chronic kidney disease   . Thyroid disease    History reviewed. No pertinent past surgical history. Social History:   has no tobacco, alcohol, and drug history on file.  No family history on file.  Medications: Patient's Medications  New Prescriptions   No medications on file  Previous Medications   ACETAMINOPHEN (TYLENOL) 325 MG TABLET    Take  325 mg by mouth every 6 (six) hours as needed.     ACETAMINOPHEN (TYLENOL) 500 MG TABLET    Take 1 caplet by mouth twice daily   BETA CAROTENE W/MINERALS (OCUVITE) TABLET    Take 1 tablet by mouth daily.     CALCIUM-VITAMIN D (OSCAL WITH D) 500-200 MG-UNIT PER TABLET    Take 1 tablet by mouth 2 (two) times daily.     CAMPHOR-EUCALYPTUS-MENTHOL (VICKS VAPORUB) 4.7-1.2-2.6 % OINT    Apply topically. Rub on chest as directed  (may keep at bedside)   DOCUSATE SODIUM (COLACE) 100 MG CAPSULE    Take 100 mg by mouth 2 (two) times daily.     DOXYCYCLINE (VIBRAMYCIN) 100 MG CAPSULE    Take 100 mg by mouth 2 (two) times daily.   DULOXETINE (CYMBALTA) 30 MG CAPSULE    Take 30 mg by mouth daily.     FOLIC ACID (FOLVITE) 1 MG TABLET    Take 1 mg by mouth daily.     FUROSEMIDE (LASIX) 20 MG TABLET    Take 20 mg by mouth daily. ONE DAY 20 NEXT DAY 40 AND ALTERNATE.   HYDROCODONE-ACETAMINOPHEN (NORCO/VICODIN) 5-325 MG PER TABLET    Take 1/2 tablet by mouth at bedtime   LEVOTHYROXINE (SYNTHROID, LEVOTHROID) 50 MCG TABLET    Take 50 mcg by mouth daily before breakfast.   MAGNESIUM OXIDE (MAG-OX) 400 MG TABLET    Take 400 mg by mouth daily.     METOPROLOL SUCCINATE (TOPROL-XL) 25 MG 24 HR TABLET    Take 25 mg by mouth daily. Taking 1/2 tab    MULTIPLE VITAMIN (MULTIVITAMIN) TABLET    Take 1 tablet by mouth daily.     POTASSIUM CHLORIDE (MICRO-K) 10 MEQ CR CAPSULE    Take 10 mEq by mouth daily. Takes 20 meq (2) 10 meq daily    SENNOSIDES-DOCUSATE SODIUM (SENOKOT-S) 8.6-50 MG TABLET    Take by mouth daily.     WARFARIN (COUMADIN) 4 MG TABLET    Take 4 mg by mouth daily. Taking 4 mg alt with 5mg   Modified Medications   No medications on file  Discontinued Medications   No medications on file     Physical Exam: Physical Exam  Constitutional: She is oriented to person, place, and time. She appears well-developed and well-nourished. No distress.  HENT:  Head: Normocephalic and atraumatic.  Clear nasal drainage  seen  Eyes: Conjunctivae and EOM are normal. Pupils are equal, round, and reactive to light.  Neck: Normal range of motion. Neck supple. No JVD present. No thyromegaly present.  Cardiovascular: Normal rate.  Frequent extrasystoles are present.  Murmur heard. Pulses:      Dorsalis pedis pulses are 2+ on the right side, and 1+ on the left side.  EM 2/6  Pulmonary/Chest: She has no wheezes. She has no rales. She exhibits no tenderness.  Occasional expiratory wheezes at the posterior lower lungs-resolved.  Abdominal: Soft. Bowel sounds are normal.  Musculoskeletal: She exhibits edema (left knee) and tenderness.       Left shoulder: She exhibits decreased range of motion, tenderness, crepitus and pain.       Legs: Anterior distal femur 2x2cm firm nodule palpated, fixed, hard, non tender. Reduced ROM of the R+L shoulder for over head activities. Left wrist/left shoulder/left knee pain on and off. Crepitus to L shoulder, endorses paint with ROM. LLE 1+. RLE trace. Worsened R knee pain with mild heat.   Lymphadenopathy:    She has no cervical adenopathy.  Neurological: She is alert and oriented to person, place, and time. She has normal reflexes. No cranial nerve deficit. She exhibits normal muscle tone. Coordination normal.  Skin: Skin is dry. No rash noted. She is not diaphoretic. There is erythema (left knee).  baseline of mild heat of the left knee. Dark pigmented anterior RLE. Worsened R knee pain with mild heat.    Psychiatric: She has a normal mood and affect. Her behavior is normal. Judgment and thought content normal. Cognition and memory are impaired. She exhibits abnormal recent memory.     Filed Vitals:   05/30/14 1151  BP: 110/68  Pulse: 86  Temp: 98.1 F (36.7 C)  TempSrc: Tympanic  Resp: 18      Labs reviewed: Basic Metabolic Panel:  Recent Labs  07/04/13  01/05/14 02/04/14 02/13/14  NA 137  < > 138 135* 134*  K 4.4  < > 4.5 4.1 4.9  BUN 26*  < > 39* 26* 24*    CREATININE 1.1  < > 1.2* 1.2* 1.2*  TSH 3.34  --  4.14  --   --   < > = values in this interval not displayed. Liver Function Tests:  Recent Labs  07/04/13 01/05/14 02/13/14  AST 29 20 17   ALT 19 15 12   ALKPHOS 49 57 60   No results for input(s): LIPASE, AMYLASE in the last 8760 hours. No results for input(s): AMMONIA in the last 8760 hours. CBC:  Recent Labs  01/16/14 02/04/14 02/13/14  WBC 21.2 31.7 29.3  HGB 11.0* 11.9* 11.3*  HCT 33* 34* 33*  PLT 100* 99* 130*   Lipid Panel: No results for input(s): CHOL, HDL, LDLCALC, TRIG, CHOLHDL, LDLDIRECT in the last 8760 hours.  Past Procedures:  05/26/14 CXR no acute infiltrate or pleural effusion, borderline to minimal cardiomegaly with pulmonary edema consistent with mild congestive heart failure.    Assessment/Plan Congestive heart failure 05/26/14 CXR no acute infiltrate or pleural effusion, borderline to minimal cardiomegaly with pulmonary edema consistent with mild congestive heart failure.  05/30/14 increase Furosemide to 40mg  daily from 20mg /40mg  alternating dose prior. (05/26/14 furosemide 60mg  x2 days-cough was improved. Avelox dc'd). Update BMP and BNP  Acute upper respiratory infection Respiratory symptoms were related to CHF which were improved after Furosemide 60mg  qd x2  Hypothyroidism Takes Levothyroxine 62mcg, last TSH 4.137 01/05/14    Major depressive disorder, recurrent episode, severe Managed with Cymbalta 30mg . Her usual state of health. She smiles, eats, sleep as usual. No complaints. She participates therapy or facility activity when she desires    Atrial fibrillation Rate controlled, continue anticoagulation with Coumadin . Takes Metoprolol 12.5mg  daily. INR therapeutic presently.     Essential hypertension Controlled, continue Metoprolol 12.5mg  daily.    Pain in joint, shoulder region Pain is controlled with Tylenol 500mg  bid and Norco 5/325 1/2 qhs.      Family/ Staff Communication:  observe the patient.  Goals of Care: SNF  Labs/tests ordered: CXR done 05/26/14. BMP and BNP

## 2014-05-30 NOTE — Assessment & Plan Note (Signed)
Rate controlled, continue anticoagulation with Coumadin . Takes Metoprolol 12.5mg daily. INR therapeutic presently.   

## 2014-05-30 NOTE — Assessment & Plan Note (Addendum)
05/26/14 CXR no acute infiltrate or pleural effusion, borderline to minimal cardiomegaly with pulmonary edema consistent with mild congestive heart failure.  05/30/14 increase Furosemide to 40mg  daily from 20mg /40mg  alternating dose prior. (05/26/14 furosemide 60mg  x2 days-cough was improved. Avelox dc'd). Update BMP and BNP

## 2014-05-30 NOTE — Assessment & Plan Note (Signed)
Respiratory symptoms were related to CHF which were improved after Furosemide 60mg  qd x2

## 2014-05-30 NOTE — Assessment & Plan Note (Signed)
Takes Levothyroxine 50mcg, last TSH 4.137 01/05/14  

## 2014-05-30 NOTE — Assessment & Plan Note (Signed)
Controlled, continue Metoprolol 12.5mg daily.    

## 2014-06-02 ENCOUNTER — Other Ambulatory Visit: Payer: Self-pay | Admitting: Nurse Practitioner

## 2014-06-02 DIAGNOSIS — I509 Heart failure, unspecified: Secondary | ICD-10-CM

## 2014-06-02 LAB — BASIC METABOLIC PANEL
BUN: 25 mg/dL — AB (ref 4–21)
Creatinine: 1.4 mg/dL — AB (ref 0.5–1.1)
Glucose: 100 mg/dL
Potassium: 4.7 mmol/L (ref 3.4–5.3)
Sodium: 138 mmol/L (ref 137–147)

## 2014-07-06 ENCOUNTER — Non-Acute Institutional Stay (SKILLED_NURSING_FACILITY): Payer: Medicare Other | Admitting: Nurse Practitioner

## 2014-07-06 ENCOUNTER — Encounter: Payer: Self-pay | Admitting: Nurse Practitioner

## 2014-07-06 DIAGNOSIS — M1711 Unilateral primary osteoarthritis, right knee: Secondary | ICD-10-CM

## 2014-07-06 DIAGNOSIS — I10 Essential (primary) hypertension: Secondary | ICD-10-CM

## 2014-07-06 DIAGNOSIS — E039 Hypothyroidism, unspecified: Secondary | ICD-10-CM

## 2014-07-06 DIAGNOSIS — K5900 Constipation, unspecified: Secondary | ICD-10-CM

## 2014-07-06 DIAGNOSIS — I509 Heart failure, unspecified: Secondary | ICD-10-CM

## 2014-07-06 DIAGNOSIS — M25519 Pain in unspecified shoulder: Secondary | ICD-10-CM

## 2014-07-06 DIAGNOSIS — I482 Chronic atrial fibrillation, unspecified: Secondary | ICD-10-CM

## 2014-07-06 DIAGNOSIS — F332 Major depressive disorder, recurrent severe without psychotic features: Secondary | ICD-10-CM

## 2014-07-06 NOTE — Assessment & Plan Note (Signed)
05/26/14 CXR no acute infiltrate or pleural effusion, borderline to minimal cardiomegaly with pulmonary edema consistent with mild congestive heart failure.  05/30/14 increase Furosemide to 40mg  daily from 20mg /40mg  alternating dose prior. (05/26/14 furosemide 60mg  x2 days-cough was improved. Avelox dc'd).  06/02/14 BNP 71.0 06/30/14 compensated clinically.

## 2014-07-06 NOTE — Assessment & Plan Note (Signed)
Takes Levothyroxine 50mcg, last TSH 4.137 01/05/14  

## 2014-07-06 NOTE — Assessment & Plan Note (Signed)
Rate controlled, continue anticoagulation with Coumadin . Takes Metoprolol 12.5mg daily. INR therapeutic presently.   

## 2014-07-06 NOTE — Assessment & Plan Note (Signed)
Controlled, continue Metoprolol 12.5mg daily.    

## 2014-07-06 NOTE — Assessment & Plan Note (Signed)
Baseline aches and resolved erythema. Continue Doxy s/p knee fusion 2nd to infected joint.

## 2014-07-06 NOTE — Progress Notes (Signed)
Patient ID: [name], female   DOB: 16-Jan-1915, 80 y.o.   MRN: [id number]   Code Status: DNR  Allergies  Allergen Reactions  . Amiodarone   . Penicillins   . Amoxicillin   . Celecoxib   . Ciprofloxacin   . Codeine   . Diltiazem   . Lactobacillus [Acidophilus Lactobacillus]   . Metoprolol Succinate     All Beta-blockers  . Metronidazole Hcl   . Other     floricene dye netribudazike    Chief Complaint  Patient presents with  . Medical Management of Chronic Issues    HPI: Patient is a 80 y.o. female seen in the SNF at [facility] today for evaluation of chronic medical conditions.  Problem List Items Addressed This Visit    Pain in joint, shoulder region - Primary (Chronic)    Pain is controlled with Tylenol 500mg  bid and Norco 5/325 1/2 qhs.       Osteoarthritis of right knee    Baseline aches and resolved erythema. Continue Doxy s/p knee fusion 2nd to infected joint.        Major depressive disorder, recurrent episode, severe    Managed with Cymbalta 30mg . Her usual state of health. She smiles, eats, sleep as usual. No complaints. She participates therapy or facility activity when she desires      Hypothyroidism    Takes Levothyroxine 72mcg, last TSH 4.137 01/05/14       Essential hypertension    Controlled, continue Metoprolol 12.5mg  daily.        Constipation    Well managed with Colace bid.       Congestive heart failure    05/26/14 CXR no acute infiltrate or pleural effusion, borderline to minimal cardiomegaly with pulmonary edema consistent with mild congestive heart failure.  05/30/14 increase Furosemide to 40mg  daily from 20mg /40mg  alternating dose prior. (05/26/14 furosemide 60mg  x2 days-cough was improved. Avelox dc'd).  06/02/14 BNP 71.0 06/30/14 compensated clinically.      Atrial fibrillation    Rate controlled, continue anticoagulation with Coumadin . Takes Metoprolol 12.5mg  daily. INR therapeutic presently.           Review of Systems:  Review of Systems  Constitutional: Negative for fever, chills, weight loss, malaise/fatigue and diaphoresis.  HENT: Positive for hearing loss. Negative for congestion, ear discharge, ear pain and sore throat.        Persisted hacking cough and nasal congestion.   Eyes: Negative for blurred vision, pain, discharge and redness.  Respiratory: Negative for cough, sputum production, shortness of breath and wheezing.   Cardiovascular: Positive for leg swelling. Negative for chest pain, palpitations, orthopnea, claudication and PND.       LLE trace only.   Gastrointestinal: Negative for nausea, vomiting, abdominal pain, diarrhea, constipation and blood in stool.  Genitourinary: Positive for frequency. Negative for dysuria, urgency and flank pain.  Musculoskeletal: Positive for joint pain. Negative for back pain and neck pain.       Decreased ROM of the R+L shoulders for over head movement. C/o worsened R knee pain with mild heat.   Skin: Negative for itching and rash.       No warmth to L knee today. Mild heat R knee today.   Neurological: Negative for tremors, sensory change, speech change, focal weakness, seizures, loss of consciousness, weakness and headaches.  Endo/Heme/Allergies: Negative for environmental allergies and polydipsia. Does not bruise/bleed easily.  Psychiatric/Behavioral: Positive for memory loss. Negative for hallucinations. The patient is not nervous/anxious  and does not have insomnia.      Past Medical History  Diagnosis Date  . Anemia   . Arthritis   . CHF (congestive heart failure)   . Depression   . Hypertension   . Chronic kidney disease   . Thyroid disease    History reviewed. No pertinent past surgical history. Social History:   has no tobacco, alcohol, and drug history on file.  No family history on file.  Medications: Patient's Medications  New Prescriptions   No medications on file  Previous Medications   ACETAMINOPHEN  (TYLENOL) 325 MG TABLET    Take 325 mg by mouth every 6 (six) hours as needed.     ACETAMINOPHEN (TYLENOL) 500 MG TABLET    Take 1 caplet by mouth twice daily   BETA CAROTENE W/MINERALS (OCUVITE) TABLET    Take 1 tablet by mouth daily.     CALCIUM-VITAMIN D (OSCAL WITH D) 500-200 MG-UNIT PER TABLET    Take 1 tablet by mouth 2 (two) times daily.     CAMPHOR-EUCALYPTUS-MENTHOL (VICKS VAPORUB) 4.7-1.2-2.6 % OINT    Apply topically. Rub on chest as directed  (may keep at bedside)   DOCUSATE SODIUM (COLACE) 100 MG CAPSULE    Take 100 mg by mouth 2 (two) times daily.     DOXYCYCLINE (VIBRAMYCIN) 100 MG CAPSULE    Take 100 mg by mouth 2 (two) times daily.   DULOXETINE (CYMBALTA) 30 MG CAPSULE    Take 30 mg by mouth daily.     FOLIC ACID (FOLVITE) 1 MG TABLET    Take 1 mg by mouth daily.     FUROSEMIDE (LASIX) 20 MG TABLET    Take 20 mg by mouth daily. ONE DAY 20 NEXT DAY 40 AND ALTERNATE.   HYDROCODONE-ACETAMINOPHEN (NORCO/VICODIN) 5-325 MG PER TABLET    Take 1/2 tablet by mouth at bedtime   LEVOTHYROXINE (SYNTHROID, LEVOTHROID) 50 MCG TABLET    Take 50 mcg by mouth daily before breakfast.   MAGNESIUM OXIDE (MAG-OX) 400 MG TABLET    Take 400 mg by mouth daily.     METOPROLOL SUCCINATE (TOPROL-XL) 25 MG 24 HR TABLET    Take 25 mg by mouth daily. Taking 1/2 tab    MULTIPLE VITAMIN (MULTIVITAMIN) TABLET    Take 1 tablet by mouth daily.     POTASSIUM CHLORIDE (MICRO-K) 10 MEQ CR CAPSULE    Take 10 mEq by mouth daily. Takes 20 meq (2) 10 meq daily    SENNOSIDES-DOCUSATE SODIUM (SENOKOT-S) 8.6-50 MG TABLET    Take by mouth daily.     WARFARIN (COUMADIN) 4 MG TABLET    Take 4 mg by mouth daily. Taking 4 mg alt with 5mg   Modified Medications   No medications on file  Discontinued Medications   No medications on file     Physical Exam: Physical Exam  Constitutional: She is oriented to person, place, and time. She appears well-developed and well-nourished. No distress.  HENT:  Head: Normocephalic and  atraumatic.  Clear nasal drainage seen  Eyes: Conjunctivae and EOM are normal. Pupils are equal, round, and reactive to light.  Neck: Normal range of motion. Neck supple. No JVD present. No thyromegaly present.  Cardiovascular: Normal rate.  Frequent extrasystoles are present.  Murmur heard. Pulses:      Dorsalis pedis pulses are 2+ on the right side, and 1+ on the left side.  EM 2/6  Pulmonary/Chest: She has no wheezes. She has no rales. She exhibits no tenderness.  Abdominal: Soft. Bowel sounds are normal.  Musculoskeletal: She exhibits edema (left knee) and tenderness.       Left shoulder: She exhibits decreased range of motion, tenderness, crepitus and pain.       Legs: Anterior distal femur 2x2cm firm nodule palpated, fixed, hard, non tender. Reduced ROM of the R+L shoulder for over head activities. Left wrist/left shoulder/left knee pain on and off. Crepitus to L shoulder, endorses paint with ROM. LLE trace edeam. Chronic R knee pain with mild heat.   Lymphadenopathy:    She has no cervical adenopathy.  Neurological: She is alert and oriented to person, place, and time. She has normal reflexes. No cranial nerve deficit. She exhibits normal muscle tone. Coordination normal.  Skin: Skin is dry. No rash noted. She is not diaphoretic. There is erythema (left knee).  baseline of mild heat of the left knee. Dark pigmented anterior RLE. Worsened R knee pain with mild heat.    Psychiatric: She has a normal mood and affect. Her behavior is normal. Judgment and thought content normal. Cognition and memory are impaired. She exhibits abnormal recent memory.     Filed Vitals:   06/30/14 1531  BP: 138/75  Pulse: 76  Temp: 97.7 F (36.5 C)  TempSrc: Tympanic  Resp: 20      Labs reviewed: Basic Metabolic Panel:  Recent Labs  01/05/14 02/04/14 02/13/14 06/02/14  NA 138 135* 134* 138  K 4.5 4.1 4.9 4.7  BUN 39* 26* 24* 25*  CREATININE 1.2* 1.2* 1.2* 1.4*  TSH 4.14  --   --   --     Liver Function Tests:  Recent Labs  01/05/14 02/13/14  AST 20 17  ALT 15 12  ALKPHOS 57 60   No results for input(s): LIPASE, AMYLASE in the last 8760 hours. No results for input(s): AMMONIA in the last 8760 hours. CBC:  Recent Labs  01/16/14 02/04/14 02/13/14  WBC 21.2 31.7 29.3  HGB 11.0* 11.9* 11.3*  HCT 33* 34* 33*  PLT 100* 99* 130*   Lipid Panel: No results for input(s): CHOL, HDL, LDLCALC, TRIG, CHOLHDL, LDLDIRECT in the last 8760 hours.  Past Procedures:  05/26/14 CXR no acute infiltrate or pleural effusion, borderline to minimal cardiomegaly with pulmonary edema consistent with mild congestive heart failure.    Assessment/Plan Pain in joint, shoulder region Pain is controlled with Tylenol 500mg  bid and Norco 5/325 1/2 qhs.     Osteoarthritis of right knee Baseline aches and resolved erythema. Continue Doxy s/p knee fusion 2nd to infected joint.      Major depressive disorder, recurrent episode, severe Managed with Cymbalta 30mg . Her usual state of health. She smiles, eats, sleep as usual. No complaints. She participates therapy or facility activity when she desires    Hypothyroidism Takes Levothyroxine 44mcg, last TSH 4.137 01/05/14     Essential hypertension Controlled, continue Metoprolol 12.5mg  daily.      Constipation Well managed with Colace bid.     Congestive heart failure 05/26/14 CXR no acute infiltrate or pleural effusion, borderline to minimal cardiomegaly with pulmonary edema consistent with mild congestive heart failure.  05/30/14 increase Furosemide to 40mg  daily from 20mg /40mg  alternating dose prior. (05/26/14 furosemide 60mg  x2 days-cough was improved. Avelox dc'd).  06/02/14 BNP 71.0 06/30/14 compensated clinically.    Atrial fibrillation Rate controlled, continue anticoagulation with Coumadin . Takes Metoprolol 12.5mg  daily. INR therapeutic presently.       Family/ Staff Communication: observe the patient.    Goals of Care: SNF  Labs/tests ordered: none.

## 2014-07-06 NOTE — Assessment & Plan Note (Signed)
Well managed with Colace bid.   

## 2014-07-06 NOTE — Assessment & Plan Note (Signed)
Managed with Cymbalta 30mg. Her usual state of health. She smiles, eats, sleep as usual. No complaints. She participates therapy or facility activity when she desires  

## 2014-07-06 NOTE — Assessment & Plan Note (Signed)
Pain is controlled with Tylenol 500mg bid and Norco 5/325 1/2 qhs.     

## 2014-08-04 ENCOUNTER — Non-Acute Institutional Stay (SKILLED_NURSING_FACILITY): Payer: Medicare Other | Admitting: Nurse Practitioner

## 2014-08-04 ENCOUNTER — Encounter: Payer: Self-pay | Admitting: Nurse Practitioner

## 2014-08-04 DIAGNOSIS — I482 Chronic atrial fibrillation, unspecified: Secondary | ICD-10-CM

## 2014-08-04 DIAGNOSIS — I10 Essential (primary) hypertension: Secondary | ICD-10-CM

## 2014-08-04 DIAGNOSIS — M25512 Pain in left shoulder: Secondary | ICD-10-CM

## 2014-08-04 DIAGNOSIS — M1711 Unilateral primary osteoarthritis, right knee: Secondary | ICD-10-CM

## 2014-08-04 DIAGNOSIS — K5900 Constipation, unspecified: Secondary | ICD-10-CM

## 2014-08-04 DIAGNOSIS — F332 Major depressive disorder, recurrent severe without psychotic features: Secondary | ICD-10-CM

## 2014-08-04 DIAGNOSIS — E039 Hypothyroidism, unspecified: Secondary | ICD-10-CM

## 2014-08-04 DIAGNOSIS — I509 Heart failure, unspecified: Secondary | ICD-10-CM

## 2014-08-04 NOTE — Assessment & Plan Note (Signed)
Rate controlled, continue anticoagulation with Coumadin . Takes Metoprolol 12.5mg  daily. INR therapeutic presently.

## 2014-08-04 NOTE — Assessment & Plan Note (Signed)
Pain is controlled with Tylenol 500mg  bid and Norco 5/325 1/2 qhs.

## 2014-08-04 NOTE — Progress Notes (Signed)
Patient ID: TIRZAH FROSS, female   DOB: 1915/03/19, 81 y.o.   MRN: [id number]   Code Status: DNR  Allergies  Allergen Reactions  . Amiodarone   . Penicillins   . Amoxicillin   . Celecoxib   . Ciprofloxacin   . Codeine   . Diltiazem   . Lactobacillus [Acidophilus Lactobacillus]   . Metoprolol Succinate     All Beta-blockers  . Metronidazole Hcl   . Other     floricene dye netribudazike    Chief Complaint  Patient presents with  . Medical Management of Chronic Issues    HPI: Patient is a 81 y.o. female seen in the SNF at [facility] today for evaluation of chronic medical conditions.  Problem List Items Addressed This Visit    Pain in joint, shoulder region - Primary (Chronic)    Pain is controlled with Tylenol 500mg  bid and Norco 5/325 1/2 qhs.        Osteoarthritis of right knee    Baseline aches and resolved erythema. Continue Doxy.       Major depressive disorder, recurrent episode, severe    Managed with Cymbalta 30mg . Her usual state of health. She smiles, eats, sleep as usual. No complaints. She participates therapy or facility activity when she desires    Hypothyroidism    Takes Levothyroxine 60mcg, last TSH 4.137 01/05/14     Essential hypertension    Controlled, continue Metoprolol 12.5mg  daily.       Constipation    Well managed with Colace bid.     Congestive heart failure    05/26/14 CXR no acute infiltrate or pleural effusion, borderline to minimal cardiomegaly with pulmonary edema consistent with mild congestive heart failure.  05/30/14 increase Furosemide to 40mg  daily from 20mg /40mg  alternating dose prior. (05/26/14 furosemide 60mg  x2 days-cough was improved. Avelox dc'd).  06/02/14 BNP 71.0 06/30/14 compensated clinically.  08/04/14 stable.      Atrial fibrillation    Rate controlled, continue anticoagulation with Coumadin . Takes Metoprolol 12.5mg  daily. INR therapeutic presently.        Review of Systems:  Review of  Systems  Constitutional: Negative for fever, chills, weight loss, malaise/fatigue and diaphoresis.  HENT: Positive for hearing loss. Negative for congestion, ear discharge, ear pain and sore throat.        Persisted hacking cough and nasal congestion.   Eyes: Negative for blurred vision, pain, discharge and redness.  Respiratory: Negative for cough, sputum production, shortness of breath and wheezing.   Cardiovascular: Positive for leg swelling. Negative for chest pain, palpitations, orthopnea, claudication and PND.       LLE trace only.   Gastrointestinal: Negative for nausea, vomiting, abdominal pain, diarrhea, constipation and blood in stool.  Genitourinary: Positive for frequency. Negative for dysuria, urgency and flank pain.  Musculoskeletal: Positive for joint pain. Negative for back pain and neck pain.       Decreased ROM of the R+L shoulders for over head movement. C/o worsened R knee pain with mild heat.   Skin: Negative for itching and rash.       No warmth to L knee today. Mild heat R knee today.   Neurological: Negative for tremors, sensory change, speech change, focal weakness, seizures, loss of consciousness, weakness and headaches.  Endo/Heme/Allergies: Negative for environmental allergies and polydipsia. Does not bruise/bleed easily.  Psychiatric/Behavioral: Positive for memory loss. Negative for hallucinations. The patient is not nervous/anxious and does not have insomnia.      Past Medical History  Diagnosis Date  . Anemia   . Arthritis   . CHF (congestive heart failure)   . Depression   . Hypertension   . Chronic kidney disease   . Thyroid disease    History reviewed. No pertinent past surgical history. Social History:   has no tobacco, alcohol, and drug history on file.  No family history on file.  Medications: Patient's Medications  New Prescriptions   No medications on file  Previous Medications   ACETAMINOPHEN (TYLENOL) 325 MG TABLET    Take 325 mg by  mouth every 6 (six) hours as needed.     ACETAMINOPHEN (TYLENOL) 500 MG TABLET    Take 1 caplet by mouth twice daily   BETA CAROTENE W/MINERALS (OCUVITE) TABLET    Take 1 tablet by mouth daily.     CALCIUM-VITAMIN D (OSCAL WITH D) 500-200 MG-UNIT PER TABLET    Take 1 tablet by mouth 2 (two) times daily.     CAMPHOR-EUCALYPTUS-MENTHOL (VICKS VAPORUB) 4.7-1.2-2.6 % OINT    Apply topically. Rub on chest as directed  (may keep at bedside)   DOCUSATE SODIUM (COLACE) 100 MG CAPSULE    Take 100 mg by mouth 2 (two) times daily.     DOXYCYCLINE (VIBRAMYCIN) 100 MG CAPSULE    Take 100 mg by mouth 2 (two) times daily.   DULOXETINE (CYMBALTA) 30 MG CAPSULE    Take 30 mg by mouth daily.     FOLIC ACID (FOLVITE) 1 MG TABLET    Take 1 mg by mouth daily.     FUROSEMIDE (LASIX) 20 MG TABLET    Take 20 mg by mouth daily. ONE DAY 20 NEXT DAY 40 AND ALTERNATE.   HYDROCODONE-ACETAMINOPHEN (NORCO/VICODIN) 5-325 MG PER TABLET    Take 1/2 tablet by mouth at bedtime   LEVOTHYROXINE (SYNTHROID, LEVOTHROID) 50 MCG TABLET    Take 50 mcg by mouth daily before breakfast.   MAGNESIUM OXIDE (MAG-OX) 400 MG TABLET    Take 400 mg by mouth daily.     METOPROLOL SUCCINATE (TOPROL-XL) 25 MG 24 HR TABLET    Take 25 mg by mouth daily. Taking 1/2 tab    MULTIPLE VITAMIN (MULTIVITAMIN) TABLET    Take 1 tablet by mouth daily.     POTASSIUM CHLORIDE (MICRO-K) 10 MEQ CR CAPSULE    Take 10 mEq by mouth daily. Takes 20 meq (2) 10 meq daily    SENNOSIDES-DOCUSATE SODIUM (SENOKOT-S) 8.6-50 MG TABLET    Take by mouth daily.     WARFARIN (COUMADIN) 4 MG TABLET    Take 4 mg by mouth daily. Taking 4 mg alt with 5mg   Modified Medications   No medications on file  Discontinued Medications   No medications on file     Physical Exam: Physical Exam  Constitutional: She is oriented to person, place, and time. She appears well-developed and well-nourished. No distress.  HENT:  Head: Normocephalic and atraumatic.  Clear nasal drainage seen    Eyes: Conjunctivae and EOM are normal. Pupils are equal, round, and reactive to light.  Neck: Normal range of motion. Neck supple. No JVD present. No thyromegaly present.  Cardiovascular: Normal rate.  Frequent extrasystoles are present.  Murmur heard. Pulses:      Dorsalis pedis pulses are 2+ on the right side, and 1+ on the left side.  EM 2/6  Pulmonary/Chest: She has no wheezes. She has no rales. She exhibits no tenderness.  Abdominal: Soft. Bowel sounds are normal.  Musculoskeletal: She exhibits edema (left knee)  and tenderness.       Left shoulder: She exhibits decreased range of motion, tenderness, crepitus and pain.       Legs: Anterior distal femur 2x2cm firm nodule palpated, fixed, hard, non tender. Reduced ROM of the R+L shoulder for over head activities. Left wrist/left shoulder/left knee pain on and off. Crepitus to L shoulder, endorses paint with ROM. LLE trace edeam. Chronic R knee pain with mild heat.   Lymphadenopathy:    She has no cervical adenopathy.  Neurological: She is alert and oriented to person, place, and time. She has normal reflexes. No cranial nerve deficit. She exhibits normal muscle tone. Coordination normal.  Skin: Skin is dry. No rash noted. She is not diaphoretic. There is erythema (left knee).  baseline of mild heat of the left knee. Dark pigmented anterior RLE. Worsened R knee pain with mild heat.    Psychiatric: She has a normal mood and affect. Her behavior is normal. Judgment and thought content normal. Cognition and memory are impaired. She exhibits abnormal recent memory.     Filed Vitals:   08/04/14 1304  BP: 112/61  Pulse: 67  Temp: 98.1 F (36.7 C)  TempSrc: Tympanic  Resp: 18      Labs reviewed: Basic Metabolic Panel:  Recent Labs  01/05/14 02/04/14 02/13/14 06/02/14  NA 138 135* 134* 138  K 4.5 4.1 4.9 4.7  BUN 39* 26* 24* 25*  CREATININE 1.2* 1.2* 1.2* 1.4*  TSH 4.14  --   --   --    Liver Function Tests:  Recent Labs   01/05/14 02/13/14  AST 20 17  ALT 15 12  ALKPHOS 57 60   No results for input(s): LIPASE, AMYLASE in the last 8760 hours. No results for input(s): AMMONIA in the last 8760 hours. CBC:  Recent Labs  01/16/14 02/04/14 02/13/14  WBC 21.2 31.7 29.3  HGB 11.0* 11.9* 11.3*  HCT 33* 34* 33*  PLT 100* 99* 130*   Lipid Panel: No results for input(s): CHOL, HDL, LDLCALC, TRIG, CHOLHDL, LDLDIRECT in the last 8760 hours.  Past Procedures:  05/26/14 CXR no acute infiltrate or pleural effusion, borderline to minimal cardiomegaly with pulmonary edema consistent with mild congestive heart failure.    Assessment/Plan Pain in joint, shoulder region Pain is controlled with Tylenol 500mg  bid and Norco 5/325 1/2 qhs.      Osteoarthritis of right knee Baseline aches and resolved erythema. Continue Doxy.     Major depressive disorder, recurrent episode, severe Managed with Cymbalta 30mg . Her usual state of health. She smiles, eats, sleep as usual. No complaints. She participates therapy or facility activity when she desires  Hypothyroidism Takes Levothyroxine 11mcg, last TSH 4.137 01/05/14   Essential hypertension Controlled, continue Metoprolol 12.5mg  daily.     Constipation Well managed with Colace bid.   Congestive heart failure 05/26/14 CXR no acute infiltrate or pleural effusion, borderline to minimal cardiomegaly with pulmonary edema consistent with mild congestive heart failure.  05/30/14 increase Furosemide to 40mg  daily from 20mg /40mg  alternating dose prior. (05/26/14 furosemide 60mg  x2 days-cough was improved. Avelox dc'd).  06/02/14 BNP 71.0 06/30/14 compensated clinically.  08/04/14 stable.    Atrial fibrillation Rate controlled, continue anticoagulation with Coumadin . Takes Metoprolol 12.5mg  daily. INR therapeutic presently.     Family/ Staff Communication: observe the patient.   Goals of Care: SNF  Labs/tests ordered: none.

## 2014-08-04 NOTE — Assessment & Plan Note (Signed)
Well managed with Colace bid.

## 2014-08-04 NOTE — Assessment & Plan Note (Signed)
Takes Levothyroxine 109mcg, last TSH 4.137 01/05/14

## 2014-08-04 NOTE — Assessment & Plan Note (Signed)
Baseline aches and resolved erythema. Continue Doxy.

## 2014-08-04 NOTE — Assessment & Plan Note (Signed)
05/26/14 CXR no acute infiltrate or pleural effusion, borderline to minimal cardiomegaly with pulmonary edema consistent with mild congestive heart failure.  05/30/14 increase Furosemide to 40mg  daily from 20mg /40mg  alternating dose prior. (05/26/14 furosemide 60mg  x2 days-cough was improved. Avelox dc'd).  06/02/14 BNP 71.0 06/30/14 compensated clinically.  08/04/14 stable.

## 2014-08-04 NOTE — Assessment & Plan Note (Signed)
Controlled, continue Metoprolol 12.5mg  daily.

## 2014-08-04 NOTE — Assessment & Plan Note (Signed)
Managed with Cymbalta 30mg . Her usual state of health. She smiles, eats, sleep as usual. No complaints. She participates therapy or facility activity when she desires

## 2014-09-05 ENCOUNTER — Encounter: Payer: Self-pay | Admitting: Nurse Practitioner

## 2014-09-05 ENCOUNTER — Non-Acute Institutional Stay (SKILLED_NURSING_FACILITY): Payer: Medicare Other | Admitting: Nurse Practitioner

## 2014-09-05 DIAGNOSIS — M1711 Unilateral primary osteoarthritis, right knee: Secondary | ICD-10-CM

## 2014-09-05 DIAGNOSIS — E039 Hypothyroidism, unspecified: Secondary | ICD-10-CM

## 2014-09-05 DIAGNOSIS — I509 Heart failure, unspecified: Secondary | ICD-10-CM

## 2014-09-05 DIAGNOSIS — K5900 Constipation, unspecified: Secondary | ICD-10-CM

## 2014-09-05 DIAGNOSIS — I482 Chronic atrial fibrillation, unspecified: Secondary | ICD-10-CM

## 2014-09-05 DIAGNOSIS — F332 Major depressive disorder, recurrent severe without psychotic features: Secondary | ICD-10-CM

## 2014-09-05 DIAGNOSIS — Z7901 Long term (current) use of anticoagulants: Secondary | ICD-10-CM

## 2014-09-05 DIAGNOSIS — Z5181 Encounter for therapeutic drug level monitoring: Secondary | ICD-10-CM

## 2014-09-05 DIAGNOSIS — I10 Essential (primary) hypertension: Secondary | ICD-10-CM

## 2014-09-05 NOTE — Assessment & Plan Note (Signed)
Controlled, continue Metoprolol 12.5mg  daily.

## 2014-09-05 NOTE — Assessment & Plan Note (Signed)
Baseline aches and resolved erythema. Continue Doxy for Hx of infected joint-s/p fusion. Tylenol bid and Norco qhs for chronic arthritic pain management.

## 2014-09-05 NOTE — Progress Notes (Signed)
Patient ID: [name], female   DOB: September 11, 1914, 81 y.o.   MRN: [id number]   Code Status: DNR  Allergies  Allergen Reactions  . Amiodarone   . Penicillins   . Amoxicillin   . Celecoxib   . Ciprofloxacin   . Codeine   . Diltiazem   . Lactobacillus [Acidophilus Lactobacillus]   . Metoprolol Succinate     All Beta-blockers  . Metronidazole Hcl   . Other     floricene dye netribudazike    Chief Complaint  Patient presents with  . Medical Management of Chronic Issues    HPI: Patient is a 81 y.o. female seen in the SNF at [facility] today for evaluation of chronic medical conditions.  Problem List Items Addressed This Visit    Osteoarthritis of right knee    Baseline aches and resolved erythema. Continue Doxy for Hx of infected joint-s/p fusion. Tylenol bid and Norco qhs for chronic arthritic pain management.          Major depressive disorder, recurrent episode, severe    Managed with Cymbalta 30mg . Her usual state of health. She smiles, eats, sleep as usual. No complaints. She participates therapy or facility activity when she desires       Hypothyroidism    Takes Levothyroxine 53mcg, last TSH 4.137 01/05/14       Essential hypertension    Controlled, continue Metoprolol 12.5mg  daily.         Constipation    Well managed with Colace bid.        Congestive heart failure    0/30/15 CXR no acute infiltrate or pleural effusion, borderline to minimal cardiomegaly with pulmonary edema consistent with mild congestive heart failure.  05/30/14 increase Furosemide to 40mg  daily from 20mg /40mg  alternating dose prior. (05/26/14 furosemide 60mg  x2 days-cough was improved. Avelox dc'd).  06/02/14 BNP 71.0 --compensated clinically.         Atrial fibrillation - Primary    Rate controlled, continue anticoagulation with Coumadin . Takes Metoprolol 12.5mg  daily. INR therapeutic presently.        Anticoagulation goal of INR 2 to 3    PT/INR monitoring. Hx  A-fib and PE          Review of Systems:  Review of Systems  Constitutional: Negative for fever, chills, weight loss, malaise/fatigue and diaphoresis.  HENT: Positive for hearing loss. Negative for congestion, ear discharge, ear pain, nosebleeds, sore throat and tinnitus.   Eyes: Negative for blurred vision, double vision, photophobia, pain, discharge and redness.  Respiratory: Negative for cough, hemoptysis, sputum production, shortness of breath, wheezing and stridor.   Cardiovascular: Positive for leg swelling. Negative for chest pain, palpitations, orthopnea, claudication and PND.       Trace  Gastrointestinal: Negative for heartburn, nausea, vomiting, abdominal pain, diarrhea, constipation, blood in stool and melena.  Genitourinary: Positive for frequency. Negative for dysuria, urgency, hematuria and flank pain.  Musculoskeletal: Positive for joint pain. Negative for myalgias, back pain, falls and neck pain.       Shoulder pain L>R  Skin: Negative for itching and rash.       Chronic venous insufficiency BLE  Neurological: Positive for sensory change. Negative for dizziness, tingling, tremors, speech change, focal weakness, seizures, loss of consciousness, weakness and headaches.  Endo/Heme/Allergies: Negative for environmental allergies and polydipsia. Bruises/bleeds easily.  Psychiatric/Behavioral: Positive for depression and memory loss. Negative for suicidal ideas, hallucinations and substance abuse. The patient is nervous/anxious. The patient does not have insomnia.  Past Medical History  Diagnosis Date  . Anemia   . Arthritis   . CHF (congestive heart failure)   . Depression   . Hypertension   . Chronic kidney disease   . Thyroid disease    History reviewed. No pertinent past surgical history. Social History:   has no tobacco, alcohol, and drug history on file.  No family history on file.  Medications: Patient's Medications  New Prescriptions   No  medications on file  Previous Medications   ACETAMINOPHEN (TYLENOL) 325 MG TABLET    Take 325 mg by mouth every 6 (six) hours as needed.     ACETAMINOPHEN (TYLENOL) 500 MG TABLET    Take 1 caplet by mouth twice daily   BETA CAROTENE W/MINERALS (OCUVITE) TABLET    Take 1 tablet by mouth daily.     CALCIUM-VITAMIN D (OSCAL WITH D) 500-200 MG-UNIT PER TABLET    Take 1 tablet by mouth 2 (two) times daily.     CAMPHOR-EUCALYPTUS-MENTHOL (VICKS VAPORUB) 4.7-1.2-2.6 % OINT    Apply topically. Rub on chest as directed  (may keep at bedside)   DOCUSATE SODIUM (COLACE) 100 MG CAPSULE    Take 100 mg by mouth 2 (two) times daily.     DOXYCYCLINE (VIBRAMYCIN) 100 MG CAPSULE    Take 100 mg by mouth 2 (two) times daily.   DULOXETINE (CYMBALTA) 30 MG CAPSULE    Take 30 mg by mouth daily.     FOLIC ACID (FOLVITE) 1 MG TABLET    Take 1 mg by mouth daily.     FUROSEMIDE (LASIX) 20 MG TABLET    Take 20 mg by mouth daily. ONE DAY 20 NEXT DAY 40 AND ALTERNATE.   HYDROCODONE-ACETAMINOPHEN (NORCO/VICODIN) 5-325 MG PER TABLET    Take 1/2 tablet by mouth at bedtime   LEVOTHYROXINE (SYNTHROID, LEVOTHROID) 50 MCG TABLET    Take 50 mcg by mouth daily before breakfast.   MAGNESIUM OXIDE (MAG-OX) 400 MG TABLET    Take 400 mg by mouth daily.     METOPROLOL SUCCINATE (TOPROL-XL) 25 MG 24 HR TABLET    Take 25 mg by mouth daily. Taking 1/2 tab    MULTIPLE VITAMIN (MULTIVITAMIN) TABLET    Take 1 tablet by mouth daily.     POTASSIUM CHLORIDE (MICRO-K) 10 MEQ CR CAPSULE    Take 10 mEq by mouth daily. Takes 20 meq (2) 10 meq daily    SENNOSIDES-DOCUSATE SODIUM (SENOKOT-S) 8.6-50 MG TABLET    Take by mouth daily.     WARFARIN (COUMADIN) 4 MG TABLET    Take 4 mg by mouth daily. Taking 4 mg alt with 5mg   Modified Medications   No medications on file  Discontinued Medications   No medications on file     Physical Exam: Physical Exam  Constitutional: She is oriented to person, place, and time. She appears well-developed and  well-nourished. No distress.  HENT:  Head: Normocephalic and atraumatic.  Clear nasal drainage seen  Eyes: Conjunctivae and EOM are normal. Pupils are equal, round, and reactive to light.  Neck: Normal range of motion. Neck supple. No JVD present. No thyromegaly present.  Cardiovascular: Normal rate.  Frequent extrasystoles are present.  Murmur heard. Pulses:      Dorsalis pedis pulses are 2+ on the right side, and 1+ on the left side.  EM 2/6  Pulmonary/Chest: She has no wheezes. She has no rales. She exhibits no tenderness.  Abdominal: Soft. Bowel sounds are normal.  Musculoskeletal: She exhibits  edema (left knee) and tenderness.       Left shoulder: She exhibits decreased range of motion, tenderness, crepitus and pain.       Legs: Anterior distal femur 2x2cm firm nodule palpated, fixed, hard, non tender. Reduced ROM of the R+L shoulder for over head activities. Left wrist/left shoulder/left knee pain on and off. Crepitus to L shoulder, endorses paint with ROM. LLE trace edeam. Chronic L knee pain with mild heat.   Lymphadenopathy:    She has no cervical adenopathy.  Neurological: She is alert and oriented to person, place, and time. She has normal reflexes. No cranial nerve deficit. She exhibits normal muscle tone. Coordination normal.  Skin: Skin is dry. No rash noted. She is not diaphoretic. There is erythema (left knee).  baseline of mild heat of the left knee. Dark pigmented anterior RLE. Worsened R knee pain with mild heat.    Psychiatric: She has a normal mood and affect. Her behavior is normal. Judgment and thought content normal. Cognition and memory are impaired. She exhibits abnormal recent memory.     Filed Vitals:   09/05/14 1329  BP: 124/74  Pulse: 78  Temp: 98.1 F (36.7 C)  TempSrc: Tympanic  Resp: 20      Labs reviewed: Basic Metabolic Panel:  Recent Labs  01/05/14 02/04/14 02/13/14 06/02/14  NA 138 135* 134* 138  K 4.5 4.1 4.9 4.7  BUN 39* 26* 24* 25*    CREATININE 1.2* 1.2* 1.2* 1.4*  TSH 4.14  --   --   --    Liver Function Tests:  Recent Labs  01/05/14 02/13/14  AST 20 17  ALT 15 12  ALKPHOS 57 60   No results for input(s): LIPASE, AMYLASE in the last 8760 hours. No results for input(s): AMMONIA in the last 8760 hours. CBC:  Recent Labs  01/16/14 02/04/14 02/13/14  WBC 21.2 31.7 29.3  HGB 11.0* 11.9* 11.3*  HCT 33* 34* 33*  PLT 100* 99* 130*   Lipid Panel: No results for input(s): CHOL, HDL, LDLCALC, TRIG, CHOLHDL, LDLDIRECT in the last 8760 hours.  Past Procedures:  05/26/14 CXR no acute infiltrate or pleural effusion, borderline to minimal cardiomegaly with pulmonary edema consistent with mild congestive heart failure.    Assessment/Plan Hypothyroidism Takes Levothyroxine 77mcg, last TSH 4.137 01/05/14    Congestive heart failure 0/30/15 CXR no acute infiltrate or pleural effusion, borderline to minimal cardiomegaly with pulmonary edema consistent with mild congestive heart failure.  05/30/14 increase Furosemide to 40mg  daily from 20mg /40mg  alternating dose prior. (05/26/14 furosemide 60mg  x2 days-cough was improved. Avelox dc'd).  06/02/14 BNP 71.0 --compensated clinically.      Anticoagulation goal of INR 2 to 3 PT/INR monitoring. Hx A-fib and PE    Major depressive disorder, recurrent episode, severe Managed with Cymbalta 30mg . Her usual state of health. She smiles, eats, sleep as usual. No complaints. She participates therapy or facility activity when she desires    Atrial fibrillation Rate controlled, continue anticoagulation with Coumadin . Takes Metoprolol 12.5mg  daily. INR therapeutic presently.     Osteoarthritis of right knee Baseline aches and resolved erythema. Continue Doxy for Hx of infected joint-s/p fusion. Tylenol bid and Norco qhs for chronic arthritic pain management.       Constipation Well managed with Colace bid.     Essential hypertension Controlled, continue  Metoprolol 12.5mg  daily.        Family/ Staff Communication: observe the patient.   Goals of Care: SNF  Labs/tests ordered: none.

## 2014-09-05 NOTE — Assessment & Plan Note (Signed)
0/30/15 CXR no acute infiltrate or pleural effusion, borderline to minimal cardiomegaly with pulmonary edema consistent with mild congestive heart failure.  05/30/14 increase Furosemide to 40mg  daily from 20mg /40mg  alternating dose prior. (05/26/14 furosemide 60mg  x2 days-cough was improved. Avelox dc'd).  06/02/14 BNP 71.0 --compensated clinically.

## 2014-09-05 NOTE — Assessment & Plan Note (Signed)
PT/INR monitoring. Hx A-fib and PE

## 2014-09-05 NOTE — Assessment & Plan Note (Signed)
Managed with Cymbalta 30mg . Her usual state of health. She smiles, eats, sleep as usual. No complaints. She participates therapy or facility activity when she desires

## 2014-09-05 NOTE — Assessment & Plan Note (Signed)
Well managed with Colace bid.

## 2014-09-05 NOTE — Assessment & Plan Note (Signed)
Rate controlled, continue anticoagulation with Coumadin . Takes Metoprolol 12.5mg  daily. INR therapeutic presently.

## 2014-09-05 NOTE — Assessment & Plan Note (Signed)
Takes Levothyroxine 79mcg, last TSH 4.137 01/05/14

## 2014-10-06 ENCOUNTER — Encounter: Payer: Self-pay | Admitting: Nurse Practitioner

## 2014-10-06 ENCOUNTER — Non-Acute Institutional Stay (SKILLED_NURSING_FACILITY): Payer: Medicare Other | Admitting: Nurse Practitioner

## 2014-10-06 DIAGNOSIS — I509 Heart failure, unspecified: Secondary | ICD-10-CM

## 2014-10-06 DIAGNOSIS — M1711 Unilateral primary osteoarthritis, right knee: Secondary | ICD-10-CM

## 2014-10-06 DIAGNOSIS — F332 Major depressive disorder, recurrent severe without psychotic features: Secondary | ICD-10-CM

## 2014-10-06 DIAGNOSIS — Z5181 Encounter for therapeutic drug level monitoring: Secondary | ICD-10-CM

## 2014-10-06 DIAGNOSIS — M25519 Pain in unspecified shoulder: Secondary | ICD-10-CM | POA: Diagnosis not present

## 2014-10-06 DIAGNOSIS — E039 Hypothyroidism, unspecified: Secondary | ICD-10-CM

## 2014-10-06 DIAGNOSIS — I482 Chronic atrial fibrillation, unspecified: Secondary | ICD-10-CM

## 2014-10-06 DIAGNOSIS — K5900 Constipation, unspecified: Secondary | ICD-10-CM

## 2014-10-06 DIAGNOSIS — Z7901 Long term (current) use of anticoagulants: Secondary | ICD-10-CM | POA: Diagnosis not present

## 2014-10-06 DIAGNOSIS — I10 Essential (primary) hypertension: Secondary | ICD-10-CM

## 2014-10-06 NOTE — Assessment & Plan Note (Signed)
Worsened pain with mild heat in the right knee. ROM has no change. Continue Doxy. Declined additional analgesic for pain.

## 2014-10-06 NOTE — Assessment & Plan Note (Signed)
Pain is controlled with Tylenol 500mg  bid and Norco 5/325 1/2 qhs.

## 2014-10-06 NOTE — Assessment & Plan Note (Signed)
PT/INR monitoring. Hx A-fib and PE

## 2014-10-06 NOTE — Assessment & Plan Note (Signed)
Takes Levothyroxine 64mcg, last TSH 4.137 01/05/14

## 2014-10-06 NOTE — Assessment & Plan Note (Signed)
0/30/15 CXR no acute infiltrate or pleural effusion, borderline to minimal cardiomegaly with pulmonary edema consistent with mild congestive heart failure.  05/30/14 increase Furosemide to 40mg  daily from 20mg /40mg  alternating dose prior. (05/26/14 furosemide 60mg  x2 days-cough was improved. Avelox dc'd).  06/02/14 BNP 71.0 --compensated clinically.

## 2014-10-06 NOTE — Assessment & Plan Note (Signed)
Controlled, continue Metoprolol 12.5mg  daily.

## 2014-10-06 NOTE — Progress Notes (Signed)
Patient ID: [name], female   DOB: 10/07/1914, 81 y.o.   MRN: [id number]   Code Status: DNR  Allergies  Allergen Reactions  . Amiodarone   . Penicillins   . Amoxicillin   . Celecoxib   . Ciprofloxacin   . Codeine   . Diltiazem   . Lactobacillus [Acidophilus Lactobacillus]   . Metoprolol Succinate     All Beta-blockers  . Metronidazole Hcl   . Other     floricene dye netribudazike    Chief Complaint  Patient presents with  . Medical Management of Chronic Issues    HPI: Patient is a 81 y.o. female seen in the SNF at [facility] today for evaluation of chronic medical conditions.  Problem List Items Addressed This Visit    Pain in joint, shoulder region (Chronic)    Pain is controlled with Tylenol 500mg  bid and Norco 5/325 1/2 qhs.        Osteoarthritis of right knee    Worsened pain with mild heat in the right knee. ROM has no change. Continue Doxy. Declined additional analgesic for pain.        Major depressive disorder, recurrent episode, severe    Managed with Cymbalta 30mg . Her usual state of health. She smiles, eats, sleep as usual. No complaints. She participates therapy or facility activity when she desires      Hypothyroidism    Takes Levothyroxine 76mcg, last TSH 4.137 01/05/14        Essential hypertension    Controlled, continue Metoprolol 12.5mg  daily.         Constipation    Well managed with Colace bid.        Congestive heart failure - Primary    0/30/15 CXR no acute infiltrate or pleural effusion, borderline to minimal cardiomegaly with pulmonary edema consistent with mild congestive heart failure.  05/30/14 increase Furosemide to 40mg  daily from 20mg /40mg  alternating dose prior. (05/26/14 furosemide 60mg  x2 days-cough was improved. Avelox dc'd).  06/02/14 BNP 71.0 --compensated clinically.         Atrial fibrillation    Rate controlled, continue anticoagulation with Coumadin . Takes Metoprolol 12.5mg  daily. INR  therapeutic presently.         Anticoagulation goal of INR 2 to 3    PT/INR monitoring. Hx A-fib and PE           Review of Systems:  Review of Systems  Constitutional: Negative for fever, chills and diaphoresis.  HENT: Positive for hearing loss. Negative for congestion, ear discharge, ear pain, nosebleeds, sore throat and tinnitus.   Eyes: Negative for photophobia, pain, discharge and redness.  Respiratory: Negative for cough, shortness of breath, wheezing and stridor.   Cardiovascular: Positive for leg swelling. Negative for chest pain and palpitations.       Trace  Gastrointestinal: Negative for nausea, vomiting, abdominal pain, diarrhea, constipation and blood in stool.  Endocrine: Negative for polydipsia.  Genitourinary: Positive for frequency. Negative for dysuria, urgency, hematuria and flank pain.  Musculoskeletal: Negative for myalgias, back pain and neck pain.       Shoulder pain L>R  Skin: Negative for rash.       Chronic venous insufficiency BLE  Allergic/Immunologic: Negative for environmental allergies.  Neurological: Negative for dizziness, tremors, seizures, weakness and headaches.  Hematological: Bruises/bleeds easily.  Psychiatric/Behavioral: Negative for suicidal ideas and hallucinations. The patient is nervous/anxious.      Past Medical History  Diagnosis Date  . Anemia   . Arthritis   .  CHF (congestive heart failure)   . Depression   . Hypertension   . Chronic kidney disease   . Thyroid disease    History reviewed. No pertinent past surgical history. Social History:   has no tobacco, alcohol, and drug history on file.  No family history on file.  Medications: Patient's Medications  New Prescriptions   No medications on file  Previous Medications   ACETAMINOPHEN (TYLENOL) 325 MG TABLET    Take 325 mg by mouth every 6 (six) hours as needed.     ACETAMINOPHEN (TYLENOL) 500 MG TABLET    Take 1 caplet by mouth twice daily   BETA CAROTENE  W/MINERALS (OCUVITE) TABLET    Take 1 tablet by mouth daily.     CALCIUM-VITAMIN D (OSCAL WITH D) 500-200 MG-UNIT PER TABLET    Take 1 tablet by mouth 2 (two) times daily.     CAMPHOR-EUCALYPTUS-MENTHOL (VICKS VAPORUB) 4.7-1.2-2.6 % OINT    Apply topically. Rub on chest as directed  (may keep at bedside)   DOCUSATE SODIUM (COLACE) 100 MG CAPSULE    Take 100 mg by mouth 2 (two) times daily.     DOXYCYCLINE (VIBRAMYCIN) 100 MG CAPSULE    Take 100 mg by mouth 2 (two) times daily.   DULOXETINE (CYMBALTA) 30 MG CAPSULE    Take 30 mg by mouth daily.     FOLIC ACID (FOLVITE) 1 MG TABLET    Take 1 mg by mouth daily.     FUROSEMIDE (LASIX) 20 MG TABLET    Take 20 mg by mouth daily. ONE DAY 20 NEXT DAY 40 AND ALTERNATE.   HYDROCODONE-ACETAMINOPHEN (NORCO/VICODIN) 5-325 MG PER TABLET    Take 1/2 tablet by mouth at bedtime   LEVOTHYROXINE (SYNTHROID, LEVOTHROID) 50 MCG TABLET    Take 50 mcg by mouth daily before breakfast.   MAGNESIUM OXIDE (MAG-OX) 400 MG TABLET    Take 400 mg by mouth daily.     METOPROLOL SUCCINATE (TOPROL-XL) 25 MG 24 HR TABLET    Take 25 mg by mouth daily. Taking 1/2 tab    MULTIPLE VITAMIN (MULTIVITAMIN) TABLET    Take 1 tablet by mouth daily.     POTASSIUM CHLORIDE (MICRO-K) 10 MEQ CR CAPSULE    Take 10 mEq by mouth daily. Takes 20 meq (2) 10 meq daily    SENNOSIDES-DOCUSATE SODIUM (SENOKOT-S) 8.6-50 MG TABLET    Take by mouth daily.     WARFARIN (COUMADIN) 4 MG TABLET    Take 4 mg by mouth daily. Taking 4 mg alt with 5mg   Modified Medications   No medications on file  Discontinued Medications   No medications on file     Physical Exam: Physical Exam  Constitutional: She is oriented to person, place, and time. She appears well-developed and well-nourished. No distress.  HENT:  Head: Normocephalic and atraumatic.  Clear nasal drainage seen  Eyes: Conjunctivae and EOM are normal. Pupils are equal, round, and reactive to light.  Neck: Normal range of motion. Neck supple. No JVD  present. No thyromegaly present.  Cardiovascular: Normal rate.  Frequent extrasystoles are present.  Murmur heard. Pulses:      Dorsalis pedis pulses are 2+ on the right side, and 1+ on the left side.  EM 2/6  Pulmonary/Chest: She has no wheezes. She has no rales. She exhibits no tenderness.  Abdominal: Soft. Bowel sounds are normal.  Musculoskeletal: She exhibits edema (left knee) and tenderness.       Left shoulder: She exhibits decreased  range of motion, tenderness, crepitus and pain.       Legs: Anterior distal femur 2x2cm firm nodule palpated, fixed, hard, non tender. Reduced ROM of the R+L shoulder for over head activities. Left wrist/left shoulder/left knee pain on and off. Crepitus to L shoulder, endorses paint with ROM. LLE trace edeam. Chronic L knee pain with mild heat.   Lymphadenopathy:    She has no cervical adenopathy.  Neurological: She is alert and oriented to person, place, and time. She has normal reflexes. No cranial nerve deficit. She exhibits normal muscle tone. Coordination normal.  Skin: Skin is dry. No rash noted. She is not diaphoretic. There is erythema (left knee).  baseline of mild heat of the left knee. Dark pigmented anterior RLE. Worsened R knee pain with mild heat.    Psychiatric: She has a normal mood and affect. Her behavior is normal. Judgment and thought content normal. Cognition and memory are impaired. She exhibits abnormal recent memory.   Filed Vitals:   10/06/14 1324  BP: 123/68  Pulse: 62  Temp: 97.7 F (36.5 C)  TempSrc: Tympanic  Resp: 18      Labs reviewed: Basic Metabolic Panel:  Recent Labs  01/05/14 02/04/14 02/13/14 06/02/14  NA 138 135* 134* 138  K 4.5 4.1 4.9 4.7  BUN 39* 26* 24* 25*  CREATININE 1.2* 1.2* 1.2* 1.4*  TSH 4.14  --   --   --    Liver Function Tests:  Recent Labs  01/05/14 02/13/14  AST 20 17  ALT 15 12  ALKPHOS 57 60   No results for input(s): LIPASE, AMYLASE in the last 8760 hours. No results for  input(s): AMMONIA in the last 8760 hours. CBC:  Recent Labs  01/16/14 02/04/14 02/13/14  WBC 21.2 31.7 29.3  HGB 11.0* 11.9* 11.3*  HCT 33* 34* 33*  PLT 100* 99* 130*   Lipid Panel: No results for input(s): CHOL, HDL, LDLCALC, TRIG, CHOLHDL, LDLDIRECT in the last 8760 hours.  Past Procedures:  05/26/14 CXR no acute infiltrate or pleural effusion, borderline to minimal cardiomegaly with pulmonary edema consistent with mild congestive heart failure.    Assessment/Plan Pain in joint, shoulder region Pain is controlled with Tylenol 500mg  bid and Norco 5/325 1/2 qhs.     Osteoarthritis of right knee Worsened pain with mild heat in the right knee. ROM has no change. Continue Doxy. Declined additional analgesic for pain.     Major depressive disorder, recurrent episode, severe Managed with Cymbalta 30mg . Her usual state of health. She smiles, eats, sleep as usual. No complaints. She participates therapy or facility activity when she desires   Hypothyroidism Takes Levothyroxine 6mcg, last TSH 4.137 01/05/14     Essential hypertension Controlled, continue Metoprolol 12.5mg  daily.      Constipation Well managed with Colace bid.     Congestive heart failure 0/30/15 CXR no acute infiltrate or pleural effusion, borderline to minimal cardiomegaly with pulmonary edema consistent with mild congestive heart failure.  05/30/14 increase Furosemide to 40mg  daily from 20mg /40mg  alternating dose prior. (05/26/14 furosemide 60mg  x2 days-cough was improved. Avelox dc'd).  06/02/14 BNP 71.0 --compensated clinically.      Anticoagulation goal of INR 2 to 3 PT/INR monitoring. Hx A-fib and PE     Atrial fibrillation Rate controlled, continue anticoagulation with Coumadin . Takes Metoprolol 12.5mg  daily. INR therapeutic presently.        Family/ Staff Communication: observe the patient.   Goals of Care: SNF  Labs/tests ordered: none

## 2014-10-06 NOTE — Assessment & Plan Note (Signed)
Managed with Cymbalta 30mg . Her usual state of health. She smiles, eats, sleep as usual. No complaints. She participates therapy or facility activity when she desires

## 2014-10-06 NOTE — Assessment & Plan Note (Signed)
Rate controlled, continue anticoagulation with Coumadin . Takes Metoprolol 12.5mg  daily. INR therapeutic presently.

## 2014-10-06 NOTE — Assessment & Plan Note (Signed)
Well managed with Colace bid.

## 2014-10-19 ENCOUNTER — Non-Acute Institutional Stay (SKILLED_NURSING_FACILITY): Payer: Medicare Other | Admitting: Internal Medicine

## 2014-10-19 DIAGNOSIS — I482 Chronic atrial fibrillation, unspecified: Secondary | ICD-10-CM

## 2014-10-19 DIAGNOSIS — K5900 Constipation, unspecified: Secondary | ICD-10-CM | POA: Diagnosis not present

## 2014-10-19 DIAGNOSIS — I10 Essential (primary) hypertension: Secondary | ICD-10-CM

## 2014-10-19 DIAGNOSIS — E039 Hypothyroidism, unspecified: Secondary | ICD-10-CM | POA: Diagnosis not present

## 2014-10-19 DIAGNOSIS — M25551 Pain in right hip: Secondary | ICD-10-CM

## 2014-10-19 DIAGNOSIS — I509 Heart failure, unspecified: Secondary | ICD-10-CM | POA: Diagnosis not present

## 2014-10-19 DIAGNOSIS — Z5181 Encounter for therapeutic drug level monitoring: Secondary | ICD-10-CM | POA: Diagnosis not present

## 2014-10-19 DIAGNOSIS — N182 Chronic kidney disease, stage 2 (mild): Secondary | ICD-10-CM | POA: Diagnosis not present

## 2014-10-19 DIAGNOSIS — Z7901 Long term (current) use of anticoagulants: Secondary | ICD-10-CM

## 2014-10-19 DIAGNOSIS — M25519 Pain in unspecified shoulder: Secondary | ICD-10-CM

## 2014-10-19 NOTE — Progress Notes (Addendum)
Patient ID: [name], female   DOB: 1914-10-05, 81 y.o.   MRN: [id number]    [place] Room Number: N 11  Place of Service: SNF (31)    Allergies  Allergen Reactions  . Amiodarone   . Penicillins   . Amoxicillin   . Celecoxib   . Ciprofloxacin   . Codeine   . Diltiazem   . Lactobacillus [Acidophilus Lactobacillus]   . Metoprolol Succinate     All Beta-blockers  . Metronidazole Hcl   . Other     floricene dye netribudazike    Chief Complaint  Patient presents with  . Medical Management of Chronic Issues    HPI:  Pain in joint, shoulder region, unspecified laterality running discomfort. Some difficulty with abduction of the arm.  Pain in right hip: Tender on palpation at right greater trochanter.  Chronic atrial fibrillation: Rate controlled and anticoagulated  Anticoagulation goal of INR 2 to 3: Recent values were therapeutic  Acute on chronic congestive heart failure, unspecified congestive heart failure type: Stable and compensated  Constipation, unspecified constipation type: Mild and using laxatives overcome this problem.  Hypothyroidism, unspecified hypothyroidism type: Needs routine follow-up  CKD (chronic kidney disease), stage II: Age routine follow-up    Medications: Patient's Medications  New Prescriptions   No medications on file  Previous Medications   ACETAMINOPHEN (TYLENOL) 325 MG TABLET    Take 325 mg by mouth every 6 (six) hours as needed.     ACETAMINOPHEN (TYLENOL) 500 MG TABLET    Take 1 caplet by mouth twice daily   BETA CAROTENE W/MINERALS (OCUVITE) TABLET    Take 1 tablet by mouth daily.     CALCIUM-VITAMIN D (OSCAL WITH D) 500-200 MG-UNIT PER TABLET    Take 1 tablet by mouth 2 (two) times daily.     CAMPHOR-EUCALYPTUS-MENTHOL (VICKS VAPORUB) 4.7-1.2-2.6 % OINT    Apply topically. Rub on chest as directed  (may keep at bedside)   DOCUSATE SODIUM (COLACE) 100 MG CAPSULE    Take 100 mg by mouth 2  (two) times daily.     DOXYCYCLINE (VIBRAMYCIN) 100 MG CAPSULE    Take 100 mg by mouth 2 (two) times daily.   DULOXETINE (CYMBALTA) 30 MG CAPSULE    Take 30 mg by mouth daily.     FOLIC ACID (FOLVITE) 1 MG TABLET    Take 1 mg by mouth daily.     FUROSEMIDE (LASIX) 20 MG TABLET    Take 20 mg by mouth daily. ONE DAY 20 NEXT DAY 40 AND ALTERNATE.   HYDROCODONE-ACETAMINOPHEN (NORCO/VICODIN) 5-325 MG PER TABLET    Take 1/2 tablet by mouth at bedtime   LEVOTHYROXINE (SYNTHROID, LEVOTHROID) 50 MCG TABLET    Take 50 mcg by mouth daily before breakfast.   MAGNESIUM OXIDE (MAG-OX) 400 MG TABLET    Take 400 mg by mouth daily.     METOPROLOL SUCCINATE (TOPROL-XL) 25 MG 24 HR TABLET    Take 25 mg by mouth daily. Taking 1/2 tab    MULTIPLE VITAMIN (MULTIVITAMIN) TABLET    Take 1 tablet by mouth daily.     POTASSIUM CHLORIDE (MICRO-K) 10 MEQ CR CAPSULE    Take 10 mEq by mouth daily. Takes 20 meq (2) 10 meq daily    SENNOSIDES-DOCUSATE SODIUM (SENOKOT-S) 8.6-50 MG TABLET    Take by mouth daily.     WARFARIN (COUMADIN) 4 MG TABLET    Take 4 mg by mouth daily. Taking 4 mg  alt with 5mg   Modified Medications   No medications on file  Discontinued Medications   No medications on file     Review of Systems  Constitutional: Negative for fever, chills and diaphoresis.  HENT: Positive for hearing loss. Negative for congestion, ear discharge, ear pain, nosebleeds, sore throat and tinnitus.   Eyes: Negative for photophobia, pain, discharge and redness.  Respiratory: Negative for cough, shortness of breath, wheezing and stridor.   Cardiovascular: Positive for leg swelling. Negative for chest pain and palpitations.       Trace  Gastrointestinal: Negative for nausea, vomiting, abdominal pain, diarrhea, constipation and blood in stool.  Endocrine: Negative for polydipsia.  Genitourinary: Positive for frequency. Negative for dysuria, urgency, hematuria and flank pain.  Musculoskeletal: Negative for myalgias, back pain  and neck pain.       Shoulder pain L>R. Mildly tender in the right hip with palpation.  Skin: Negative for rash.       Chronic venous insufficiency BLE  Allergic/Immunologic: Negative for environmental allergies.  Neurological: Negative for dizziness, tremors, seizures, weakness and headaches.       Short-term memory loss  Hematological: Bruises/bleeds easily.  Psychiatric/Behavioral: Negative for suicidal ideas and hallucinations. The patient is nervous/anxious.     Filed Vitals:   10/19/14 1335  BP: 124/72  Pulse: 76  Temp: 98.1 F (36.7 C)  Resp: 18  Height: 5' 4.5" (1.638 m)  Weight: 193 lb 3.2 oz (87.635 kg)  SpO2: 93%   Body mass index is 32.66 kg/(m^2).  Physical Exam  Constitutional: She is oriented to person, place, and time. She appears well-developed and well-nourished. No distress.  HENT:  Head: Normocephalic and atraumatic.  Clear nasal drainage seen  Eyes: Conjunctivae and EOM are normal. Pupils are equal, round, and reactive to light.  Neck: Normal range of motion. Neck supple. No JVD present. No thyromegaly present.  Cardiovascular: Normal rate.  Frequent extrasystoles are present.  Murmur heard. Pulses:      Dorsalis pedis pulses are 2+ on the right side, and 1+ on the left side.  EM 2/6  Pulmonary/Chest: She has no wheezes. She has no rales. She exhibits no tenderness.  Abdominal: Soft. Bowel sounds are normal.  Musculoskeletal: She exhibits edema (left knee) and tenderness.       Left shoulder: She exhibits decreased range of motion, tenderness, crepitus and pain.       Legs: Anterior distal femur 2x2cm firm nodule palpated, fixed, hard, non tender. Reduced ROM of the R+L shoulder for over head activities. Left wrist/left shoulder/left knee pain on and off. Crepitus to L shoulder, has pain with ROM. LLE trace edema. Chronic L knee pain with mild heat. Mild discomfort at the hip greater trochanter with palpation.  Lymphadenopathy:    She has no cervical  adenopathy.  Neurological: She is alert and oriented to person, place, and time. She has normal reflexes. No cranial nerve deficit. She exhibits normal muscle tone. Coordination normal.  Skin: Skin is dry. No rash noted. She is not diaphoretic. There is erythema (left knee).  baseline of mild heat of the left knee. Dark pigmented anterior RLE. Worsened R knee pain with mild heat.    Psychiatric: She has a normal mood and affect. Her behavior is normal. Judgment and thought content normal. Cognition and memory are impaired. She exhibits abnormal recent memory.     Labs reviewed: No visits with results within 3 Month(s) from this visit. Latest known visit with results is:  Lab on 06/02/2014  Component Date Value Ref Range Status  . Glucose 06/02/2014 100   Final  . BUN 06/02/2014 25* 4 - 21 mg/dL Final  . Creatinine 06/02/2014 1.4* 0.5 - 1.1 mg/dL Final  . Potassium 06/02/2014 4.7  3.4 - 5.3 mmol/L Final  . Sodium 06/02/2014 138  137 - 147 mmol/L Final     Assessment/Plan   1. Pain in joint, shoulder region, unspecified laterality observe  2. Pain in right hip observe  3. Chronic atrial fibrillation Controlled and anticoagulated   4. Anticoagulation goal of INR 2 to 3 Therapeutic  5. Acute on chronic congestive heart failure, unspecified congestive heart failure type Compensated  6. Constipation, unspecified constipation type Attending laxatives  7. Hypothyroidism, unspecified hypothyroidism type Routine lab data  8. CKD (chronic kidney disease), stage II Routine lab needed

## 2014-10-23 LAB — BASIC METABOLIC PANEL
Creatinine: 1.1 mg/dL (ref 0.5–1.1)
GLUCOSE: 99 mg/dL
Potassium: 4.5 mmol/L (ref 3.4–5.3)
SODIUM: 139 mmol/L (ref 137–147)

## 2014-10-23 LAB — HEMOGLOBIN A1C: HEMOGLOBIN A1C: 8.8 % — AB (ref 4.0–6.0)

## 2014-10-27 ENCOUNTER — Non-Acute Institutional Stay (SKILLED_NURSING_FACILITY): Payer: Medicare Other | Admitting: Nurse Practitioner

## 2014-10-27 DIAGNOSIS — Z7901 Long term (current) use of anticoagulants: Secondary | ICD-10-CM | POA: Diagnosis not present

## 2014-10-27 DIAGNOSIS — F333 Major depressive disorder, recurrent, severe with psychotic symptoms: Secondary | ICD-10-CM | POA: Diagnosis not present

## 2014-10-27 DIAGNOSIS — M25512 Pain in left shoulder: Secondary | ICD-10-CM | POA: Diagnosis not present

## 2014-10-27 DIAGNOSIS — I10 Essential (primary) hypertension: Secondary | ICD-10-CM

## 2014-10-27 DIAGNOSIS — K5900 Constipation, unspecified: Secondary | ICD-10-CM | POA: Diagnosis not present

## 2014-10-27 DIAGNOSIS — M1711 Unilateral primary osteoarthritis, right knee: Secondary | ICD-10-CM

## 2014-10-27 DIAGNOSIS — I482 Chronic atrial fibrillation, unspecified: Secondary | ICD-10-CM

## 2014-10-27 DIAGNOSIS — E039 Hypothyroidism, unspecified: Secondary | ICD-10-CM

## 2014-10-27 DIAGNOSIS — I5040 Unspecified combined systolic (congestive) and diastolic (congestive) heart failure: Secondary | ICD-10-CM

## 2014-10-27 DIAGNOSIS — Z5181 Encounter for therapeutic drug level monitoring: Secondary | ICD-10-CM | POA: Diagnosis not present

## 2014-10-27 NOTE — Progress Notes (Signed)
Patient ID: [name], female   DOB: [birth year], 81 y.o.   MRN: [id number]   Code Status: DNR  Allergies  Allergen Reactions  . Amiodarone   . Penicillins   . Amoxicillin   . Celecoxib   . Ciprofloxacin   . Codeine   . Diltiazem   . Lactobacillus [Acidophilus Lactobacillus]   . Metoprolol Succinate     All Beta-blockers  . Metronidazole Hcl   . Other     floricene dye netribudazike    Chief Complaint  Patient presents with  . Medical Management of Chronic Issues    HPI: Patient is a 81 y.o. female seen in the SNF at [facility] today for evaluation of thyroid and chronic medical conditions.  Problem List Items Addressed This Visit    Pain in joint, shoulder region (Chronic)    Pain is controlled with Tylenol 500mg  bid and Norco 5/325 1/2 qhs.        Major depressive disorder, recurrent episode, severe    Managed with Cymbalta 30mg . Her usual state of health. She smiles, eats, sleep as usual. No complaints. She participates therapy or facility activity when she desires      Essential hypertension    Controlled, continue Metoprolol 12.5mg  daily.         Atrial fibrillation    Rate controlled, continue anticoagulation with Coumadin . Takes Metoprolol 12.5mg  daily. INR therapeutic presently.         Congestive heart failure    0/30/15 CXR no acute infiltrate or pleural effusion, borderline to minimal cardiomegaly with pulmonary edema consistent with mild congestive heart failure.  05/30/14 increase Furosemide to 40mg  daily from 20mg /40mg  alternating dose prior. (05/26/14 furosemide 60mg  x2 days-cough was improved. Avelox dc'd).  06/02/14 BNP 71.0 --compensated clinically.         Osteoarthritis of right knee    Baseline aches. Continue Doxy.         Constipation    Well managed with Colace bid.       Anticoagulation goal of INR 2 to 3    PT/INR monitoring. Hx A-fib and PE        Hypothyroidism - Primary    10/23/14 TSH  8.754-increase Levothyroxine to 22mcg po daily, update TSH in 8 weeks.          Review of Systems:  Review of Systems  Constitutional: Negative for fever, chills and diaphoresis.  HENT: Positive for hearing loss. Negative for congestion, ear discharge, ear pain, nosebleeds, sore throat and tinnitus.   Eyes: Negative for photophobia, pain, discharge and redness.  Respiratory: Negative for cough, shortness of breath, wheezing and stridor.   Cardiovascular: Positive for leg swelling. Negative for chest pain and palpitations.       Trace  Gastrointestinal: Negative for nausea, vomiting, abdominal pain, diarrhea, constipation and blood in stool.  Endocrine: Negative for polydipsia.  Genitourinary: Positive for frequency. Negative for dysuria, urgency, hematuria and flank pain.  Musculoskeletal: Negative for myalgias, back pain and neck pain.       Shoulder pain L>R  Skin: Negative for rash.       Chronic venous insufficiency BLE  Allergic/Immunologic: Negative for environmental allergies.  Neurological: Negative for dizziness, tremors, seizures, weakness and headaches.  Hematological: Bruises/bleeds easily.  Psychiatric/Behavioral: Negative for suicidal ideas and hallucinations. The patient is nervous/anxious.      Past Medical History  Diagnosis Date  . Anemia   . Arthritis   . CHF (congestive heart failure)   . Depression   .  Hypertension   . Chronic kidney disease   . Thyroid disease    History reviewed. No pertinent past surgical history. Social History:   has no tobacco, alcohol, and drug history on file.  No family history on file.  Medications: Patient's Medications  New Prescriptions   No medications on file  Previous Medications   ACETAMINOPHEN (TYLENOL) 325 MG TABLET    Take 325 mg by mouth every 6 (six) hours as needed.     ACETAMINOPHEN (TYLENOL) 500 MG TABLET    Take 1 caplet by mouth twice daily   BETA CAROTENE W/MINERALS (OCUVITE) TABLET    Take 1 tablet  by mouth daily.     CALCIUM-VITAMIN D (OSCAL WITH D) 500-200 MG-UNIT PER TABLET    Take 1 tablet by mouth 2 (two) times daily.     CAMPHOR-EUCALYPTUS-MENTHOL (VICKS VAPORUB) 4.7-1.2-2.6 % OINT    Apply topically. Rub on chest as directed  (may keep at bedside)   DOCUSATE SODIUM (COLACE) 100 MG CAPSULE    Take 100 mg by mouth 2 (two) times daily.     DOXYCYCLINE (VIBRAMYCIN) 100 MG CAPSULE    Take 100 mg by mouth 2 (two) times daily.   DULOXETINE (CYMBALTA) 30 MG CAPSULE    Take 30 mg by mouth daily.     FOLIC ACID (FOLVITE) 1 MG TABLET    Take 1 mg by mouth daily.     FUROSEMIDE (LASIX) 20 MG TABLET    Take 20 mg by mouth daily. ONE DAY 20 NEXT DAY 40 AND ALTERNATE.   HYDROCODONE-ACETAMINOPHEN (NORCO/VICODIN) 5-325 MG PER TABLET    Take 1/2 tablet by mouth at bedtime   LEVOTHYROXINE (SYNTHROID, LEVOTHROID) 50 MCG TABLET    Take 50 mcg by mouth daily before breakfast.   MAGNESIUM OXIDE (MAG-OX) 400 MG TABLET    Take 400 mg by mouth daily.     METOPROLOL SUCCINATE (TOPROL-XL) 25 MG 24 HR TABLET    Take 25 mg by mouth daily. Taking 1/2 tab    MULTIPLE VITAMIN (MULTIVITAMIN) TABLET    Take 1 tablet by mouth daily.     POTASSIUM CHLORIDE (MICRO-K) 10 MEQ CR CAPSULE    Take 10 mEq by mouth daily. Takes 20 meq (2) 10 meq daily    SENNOSIDES-DOCUSATE SODIUM (SENOKOT-S) 8.6-50 MG TABLET    Take by mouth daily.     WARFARIN (COUMADIN) 4 MG TABLET    Take 4 mg by mouth daily. Taking 4 mg alt with 5mg   Modified Medications   No medications on file  Discontinued Medications   No medications on file     Physical Exam: Physical Exam  Constitutional: She is oriented to person, place, and time. She appears well-developed and well-nourished. No distress.  HENT:  Head: Normocephalic and atraumatic.  Clear nasal drainage seen  Eyes: Conjunctivae and EOM are normal. Pupils are equal, round, and reactive to light.  Neck: Normal range of motion. Neck supple. No JVD present. No thyromegaly present.    Cardiovascular: Normal rate.  Frequent extrasystoles are present.  Murmur heard. Pulses:      Dorsalis pedis pulses are 2+ on the right side, and 1+ on the left side.  EM 2/6  Pulmonary/Chest: She has no wheezes. She has no rales. She exhibits no tenderness.  Abdominal: Soft. Bowel sounds are normal.  Musculoskeletal: She exhibits edema (left knee) and tenderness.       Left shoulder: She exhibits decreased range of motion, tenderness, crepitus and pain.  Legs: Anterior distal femur 2x2cm firm nodule palpated, fixed, hard, non tender. Reduced ROM of the R+L shoulder for over head activities. Left wrist/left shoulder/left knee pain on and off. Crepitus to L shoulder, endorses paint with ROM. LLE trace edeam. Chronic L knee pain with mild heat.   Lymphadenopathy:    She has no cervical adenopathy.  Neurological: She is alert and oriented to person, place, and time. She has normal reflexes. No cranial nerve deficit. She exhibits normal muscle tone. Coordination normal.  Skin: Skin is dry. No rash noted. She is not diaphoretic. There is erythema (left knee).  baseline of mild heat of the left knee. Dark pigmented anterior RLE. Worsened R knee pain with mild heat.    Psychiatric: She has a normal mood and affect. Her behavior is normal. Judgment and thought content normal. Cognition and memory are impaired. She exhibits abnormal recent memory.   Filed Vitals:   10/27/14 1500  BP: 130/60  Pulse: 84  Temp: 97.8 F (36.6 C)  TempSrc: Tympanic  Resp: 16      Labs reviewed: Basic Metabolic Panel:  Recent Labs  01/05/14 02/04/14 02/13/14 06/02/14 10/23/14  NA 138 135* 134* 138 139  K 4.5 4.1 4.9 4.7 4.5  BUN 39* 26* 24* 25*  --   CREATININE 1.2* 1.2* 1.2* 1.4* 1.1  TSH 4.14  --   --   --   --    Liver Function Tests:  Recent Labs  01/05/14 02/13/14  AST 20 17  ALT 15 12  ALKPHOS 57 60   No results for input(s): LIPASE, AMYLASE in the last 8760 hours. No results for  input(s): AMMONIA in the last 8760 hours. CBC:  Recent Labs  01/16/14 02/04/14 02/13/14  WBC 21.2 31.7 29.3  HGB 11.0* 11.9* 11.3*  HCT 33* 34* 33*  PLT 100* 99* 130*   Lipid Panel: No results for input(s): CHOL, HDL, LDLCALC, TRIG, CHOLHDL, LDLDIRECT in the last 8760 hours.  Past Procedures:  05/26/14 CXR no acute infiltrate or pleural effusion, borderline to minimal cardiomegaly with pulmonary edema consistent with mild congestive heart failure.    Assessment/Plan Hypothyroidism 10/23/14 TSH 8.754-increase Levothyroxine to 50mcg po daily, update TSH in 8 weeks.    Congestive heart failure 0/30/15 CXR no acute infiltrate or pleural effusion, borderline to minimal cardiomegaly with pulmonary edema consistent with mild congestive heart failure.  05/30/14 increase Furosemide to 40mg  daily from 20mg /40mg  alternating dose prior. (05/26/14 furosemide 60mg  x2 days-cough was improved. Avelox dc'd).  06/02/14 BNP 71.0 --compensated clinically.      Major depressive disorder, recurrent episode, severe Managed with Cymbalta 30mg . Her usual state of health. She smiles, eats, sleep as usual. No complaints. She participates therapy or facility activity when she desires   Essential hypertension Controlled, continue Metoprolol 12.5mg  daily.      Atrial fibrillation Rate controlled, continue anticoagulation with Coumadin . Takes Metoprolol 12.5mg  daily. INR therapeutic presently.      Pain in joint, shoulder region Pain is controlled with Tylenol 500mg  bid and Norco 5/325 1/2 qhs.     Osteoarthritis of right knee Baseline aches. Continue Doxy.      Constipation Well managed with Colace bid.    Anticoagulation goal of INR 2 to 3 PT/INR monitoring. Hx A-fib and PE       Family/ Staff Communication: observe the patient.   Goals of Care: SNF  Labs/tests ordered: TSH 8 weeks.

## 2014-10-29 ENCOUNTER — Encounter: Payer: Self-pay | Admitting: Nurse Practitioner

## 2014-10-29 NOTE — Assessment & Plan Note (Signed)
PT/INR monitoring. Hx A-fib and PE

## 2014-10-29 NOTE — Assessment & Plan Note (Signed)
Pain is controlled with Tylenol 500mg  bid and Norco 5/325 1/2 qhs.

## 2014-10-29 NOTE — Assessment & Plan Note (Signed)
Baseline aches. Continue Doxy.

## 2014-10-29 NOTE — Assessment & Plan Note (Signed)
Managed with Cymbalta 30mg . Her usual state of health. She smiles, eats, sleep as usual. No complaints. She participates therapy or facility activity when she desires

## 2014-10-29 NOTE — Assessment & Plan Note (Signed)
Controlled, continue Metoprolol 12.5mg  daily.

## 2014-10-29 NOTE — Assessment & Plan Note (Signed)
Rate controlled, continue anticoagulation with Coumadin . Takes Metoprolol 12.5mg  daily. INR therapeutic presently.

## 2014-10-29 NOTE — Assessment & Plan Note (Signed)
0/30/15 CXR no acute infiltrate or pleural effusion, borderline to minimal cardiomegaly with pulmonary edema consistent with mild congestive heart failure.  05/30/14 increase Furosemide to 40mg  daily from 20mg /40mg  alternating dose prior. (05/26/14 furosemide 60mg  x2 days-cough was improved. Avelox dc'd).  06/02/14 BNP 71.0 --compensated clinically.

## 2014-10-29 NOTE — Assessment & Plan Note (Signed)
10/23/14 TSH 8.754-increase Levothyroxine to 65mcg po daily, update TSH in 8 weeks.

## 2014-10-29 NOTE — Assessment & Plan Note (Signed)
Well managed with Colace bid.

## 2014-12-12 ENCOUNTER — Encounter: Payer: Self-pay | Admitting: Nurse Practitioner

## 2014-12-12 ENCOUNTER — Non-Acute Institutional Stay (SKILLED_NURSING_FACILITY): Payer: Medicare Other | Admitting: Nurse Practitioner

## 2014-12-12 DIAGNOSIS — I10 Essential (primary) hypertension: Secondary | ICD-10-CM

## 2014-12-12 DIAGNOSIS — I482 Chronic atrial fibrillation, unspecified: Secondary | ICD-10-CM

## 2014-12-12 DIAGNOSIS — K5900 Constipation, unspecified: Secondary | ICD-10-CM

## 2014-12-12 DIAGNOSIS — M25512 Pain in left shoulder: Secondary | ICD-10-CM | POA: Diagnosis not present

## 2014-12-12 DIAGNOSIS — M1711 Unilateral primary osteoarthritis, right knee: Secondary | ICD-10-CM | POA: Diagnosis not present

## 2014-12-12 DIAGNOSIS — F332 Major depressive disorder, recurrent severe without psychotic features: Secondary | ICD-10-CM | POA: Diagnosis not present

## 2014-12-12 DIAGNOSIS — I509 Heart failure, unspecified: Secondary | ICD-10-CM | POA: Diagnosis not present

## 2014-12-12 DIAGNOSIS — E039 Hypothyroidism, unspecified: Secondary | ICD-10-CM | POA: Diagnosis not present

## 2014-12-12 NOTE — Assessment & Plan Note (Signed)
Rate controlled, continue anticoagulation with Coumadin . Takes Metoprolol 12.5mg  daily. INR therapeutic presently.

## 2014-12-12 NOTE — Assessment & Plan Note (Signed)
0/30/15 CXR no acute infiltrate or pleural effusion, borderline to minimal cardiomegaly with pulmonary edema consistent with mild congestive heart failure.  05/30/14 increase Furosemide to 40mg  daily from 20mg /40mg  alternating dose prior. (05/26/14 furosemide 60mg  x2 days-cough was improved. Avelox dc'd).  06/02/14 BNP 71.0 --compensated clinically.

## 2014-12-12 NOTE — Assessment & Plan Note (Signed)
Managed with Cymbalta 30mg . Her usual state of health. She smiles, eats, sleep as usual. No complaints. She participates therapy or facility activity when she desires

## 2014-12-12 NOTE — Assessment & Plan Note (Signed)
Baseline aches. Continue Doxy.

## 2014-12-12 NOTE — Assessment & Plan Note (Signed)
Controlled, continue Metoprolol 12.5mg  daily.

## 2014-12-12 NOTE — Assessment & Plan Note (Signed)
10/23/14 TSH 8.754-increased Levothyroxine to 54mcg po daily 12/12/14 pending TSH

## 2014-12-12 NOTE — Progress Notes (Signed)
Patient ID: [name], female   DOB: 06-21-1915, 81 y.o.   MRN: [id number]   Code Status: DNR  Allergies  Allergen Reactions  . Amiodarone   . Penicillins   . Amoxicillin   . Celecoxib   . Ciprofloxacin   . Codeine   . Diltiazem   . Lactobacillus [Acidophilus Lactobacillus]   . Metoprolol Succinate     All Beta-blockers  . Metronidazole Hcl   . Other     floricene dye netribudazike    Chief Complaint  Patient presents with  . Medical Management of Chronic Issues    HPI: Patient is a 81 y.o. female seen in the SNF at [facility] today for evaluation of chronic medical conditions.  Problem List Items Addressed This Visit    Pain in joint, shoulder region - Primary (Chronic)    Pain is controlled with Tylenol 500mg  bid and Norco 5/325 1/2 qhs.       Major depressive disorder, recurrent episode, severe    Managed with Cymbalta 30mg . Her usual state of health. She smiles, eats, sleep as usual. No complaints. She participates therapy or facility activity when she desires       Essential hypertension    Controlled, continue Metoprolol 12.5mg  daily.         Atrial fibrillation    Rate controlled, continue anticoagulation with Coumadin . Takes Metoprolol 12.5mg  daily. INR therapeutic presently.        Congestive heart failure    0/30/15 CXR no acute infiltrate or pleural effusion, borderline to minimal cardiomegaly with pulmonary edema consistent with mild congestive heart failure.  05/30/14 increase Furosemide to 40mg  daily from 20mg /40mg  alternating dose prior. (05/26/14 furosemide 60mg  x2 days-cough was improved. Avelox dc'd).  06/02/14 BNP 71.0 --compensated clinically.        Osteoarthritis of right knee    Baseline aches. Continue Doxy.         Constipation    Well managed with Colace bid.       Hypothyroidism    10/23/14 TSH 8.754-increased Levothyroxine to 42mcg po daily 12/12/14 pending TSH         Review of Systems:  Review of  Systems  Constitutional: Negative for fever, chills and diaphoresis.  HENT: Positive for hearing loss. Negative for congestion, ear discharge, ear pain, nosebleeds, sore throat and tinnitus.   Eyes: Negative for photophobia, pain, discharge and redness.  Respiratory: Negative for cough, shortness of breath, wheezing and stridor.   Cardiovascular: Positive for leg swelling. Negative for chest pain and palpitations.       Trace  Gastrointestinal: Negative for nausea, vomiting, abdominal pain, diarrhea, constipation and blood in stool.  Endocrine: Negative for polydipsia.  Genitourinary: Positive for frequency. Negative for dysuria, urgency, hematuria and flank pain.  Musculoskeletal: Negative for myalgias, back pain and neck pain.       Shoulder pain L>R  Skin: Negative for rash.       Chronic venous insufficiency BLE Ecchymoses abd  Allergic/Immunologic: Negative for environmental allergies.  Neurological: Negative for dizziness, tremors, seizures, weakness and headaches.  Hematological: Bruises/bleeds easily.  Psychiatric/Behavioral: Negative for suicidal ideas and hallucinations. The patient is nervous/anxious.      Past Medical History  Diagnosis Date  . Anemia   . Arthritis   . CHF (congestive heart failure)   . Depression   . Hypertension   . Chronic kidney disease   . Thyroid disease    History reviewed. No pertinent past surgical history. Social History:  has no tobacco, alcohol, and drug history on file.  No family history on file.  Medications: Patient's Medications  New Prescriptions   No medications on file  Previous Medications   ACETAMINOPHEN (TYLENOL) 325 MG TABLET    Take 325 mg by mouth every 6 (six) hours as needed.     ACETAMINOPHEN (TYLENOL) 500 MG TABLET    Take 1 caplet by mouth twice daily   BETA CAROTENE W/MINERALS (OCUVITE) TABLET    Take 1 tablet by mouth daily.     CALCIUM-VITAMIN D (OSCAL WITH D) 500-200 MG-UNIT PER TABLET    Take 1 tablet by  mouth 2 (two) times daily.     CAMPHOR-EUCALYPTUS-MENTHOL (VICKS VAPORUB) 4.7-1.2-2.6 % OINT    Apply topically. Rub on chest as directed  (may keep at bedside)   DOCUSATE SODIUM (COLACE) 100 MG CAPSULE    Take 100 mg by mouth 2 (two) times daily.     DOXYCYCLINE (VIBRAMYCIN) 100 MG CAPSULE    Take 100 mg by mouth 2 (two) times daily.   DULOXETINE (CYMBALTA) 30 MG CAPSULE    Take 30 mg by mouth daily.     FOLIC ACID (FOLVITE) 1 MG TABLET    Take 1 mg by mouth daily.     FUROSEMIDE (LASIX) 20 MG TABLET    Take 20 mg by mouth daily. ONE DAY 20 NEXT DAY 40 AND ALTERNATE.   HYDROCODONE-ACETAMINOPHEN (NORCO/VICODIN) 5-325 MG PER TABLET    Take 1/2 tablet by mouth at bedtime   LEVOTHYROXINE (SYNTHROID, LEVOTHROID) 50 MCG TABLET    Take 50 mcg by mouth daily before breakfast.   MAGNESIUM OXIDE (MAG-OX) 400 MG TABLET    Take 400 mg by mouth daily.     METOPROLOL SUCCINATE (TOPROL-XL) 25 MG 24 HR TABLET    Take 25 mg by mouth daily. Taking 1/2 tab    MULTIPLE VITAMIN (MULTIVITAMIN) TABLET    Take 1 tablet by mouth daily.     POTASSIUM CHLORIDE (MICRO-K) 10 MEQ CR CAPSULE    Take 10 mEq by mouth daily. Takes 20 meq (2) 10 meq daily    SENNOSIDES-DOCUSATE SODIUM (SENOKOT-S) 8.6-50 MG TABLET    Take by mouth daily.     WARFARIN (COUMADIN) 4 MG TABLET    Take 4 mg by mouth daily. Taking 4 mg alt with 5mg   Modified Medications   No medications on file  Discontinued Medications   No medications on file     Physical Exam: Physical Exam  Constitutional: She is oriented to person, place, and time. She appears well-developed and well-nourished. No distress.  HENT:  Head: Normocephalic and atraumatic.  Clear nasal drainage seen  Eyes: Conjunctivae and EOM are normal. Pupils are equal, round, and reactive to light.  Neck: Normal range of motion. Neck supple. No JVD present. No thyromegaly present.  Cardiovascular: Normal rate.  Frequent extrasystoles are present.  Murmur heard. Pulses:      Dorsalis  pedis pulses are 2+ on the right side, and 1+ on the left side.  EM 2/6  Pulmonary/Chest: She has no wheezes. She has no rales. She exhibits no tenderness.  Abdominal: Soft. Bowel sounds are normal.  Musculoskeletal: She exhibits edema (left knee) and tenderness.       Left shoulder: She exhibits decreased range of motion, tenderness, crepitus and pain.       Legs: Anterior distal femur 2x2cm firm nodule palpated, fixed, hard, non tender. Reduced ROM of the R+L shoulder for over head activities. Left wrist/left  shoulder/left knee pain on and off. Crepitus to L shoulder, endorses paint with ROM. LLE trace edeam. Chronic L knee pain with mild heat.   Lymphadenopathy:    She has no cervical adenopathy.  Neurological: She is alert and oriented to person, place, and time. She has normal reflexes. No cranial nerve deficit. She exhibits normal muscle tone. Coordination normal.  Skin: Skin is dry. No rash noted. She is not diaphoretic. There is erythema (left knee).  baseline of mild heat of the left knee. Dark pigmented anterior RLE. Worsened R knee pain with mild heat.  abd ecchymosis w/o identified etiology.   Psychiatric: She has a normal mood and affect. Her behavior is normal. Judgment and thought content normal. Cognition and memory are impaired. She exhibits abnormal recent memory.   Filed Vitals:   12/12/14 1419  BP: 122/54  Pulse: 70  Temp: 97.2 F (36.2 C)  TempSrc: Tympanic  Resp: 16      Labs reviewed: Basic Metabolic Panel:  Recent Labs  01/05/14 02/04/14 02/13/14 06/02/14 10/23/14  NA 138 135* 134* 138 139  K 4.5 4.1 4.9 4.7 4.5  BUN 39* 26* 24* 25*  --   CREATININE 1.2* 1.2* 1.2* 1.4* 1.1  TSH 4.14  --   --   --   --    Liver Function Tests:  Recent Labs  01/05/14 02/13/14  AST 20 17  ALT 15 12  ALKPHOS 57 60   No results for input(s): LIPASE, AMYLASE in the last 8760 hours. No results for input(s): AMMONIA in the last 8760 hours. CBC:  Recent Labs   01/16/14 02/04/14 02/13/14  WBC 21.2 31.7 29.3  HGB 11.0* 11.9* 11.3*  HCT 33* 34* 33*  PLT 100* 99* 130*   Lipid Panel: No results for input(s): CHOL, HDL, LDLCALC, TRIG, CHOLHDL, LDLDIRECT in the last 8760 hours.  Past Procedures:  05/26/14 CXR no acute infiltrate or pleural effusion, borderline to minimal cardiomegaly with pulmonary edema consistent with mild congestive heart failure.    Assessment/Plan Pain in joint, shoulder region Pain is controlled with Tylenol 500mg  bid and Norco 5/325 1/2 qhs.    Major depressive disorder, recurrent episode, severe Managed with Cymbalta 30mg . Her usual state of health. She smiles, eats, sleep as usual. No complaints. She participates therapy or facility activity when she desires    Essential hypertension Controlled, continue Metoprolol 12.5mg  daily.      Atrial fibrillation Rate controlled, continue anticoagulation with Coumadin . Takes Metoprolol 12.5mg  daily. INR therapeutic presently.     Congestive heart failure 0/30/15 CXR no acute infiltrate or pleural effusion, borderline to minimal cardiomegaly with pulmonary edema consistent with mild congestive heart failure.  05/30/14 increase Furosemide to 40mg  daily from 20mg /40mg  alternating dose prior. (05/26/14 furosemide 60mg  x2 days-cough was improved. Avelox dc'd).  06/02/14 BNP 71.0 --compensated clinically.     Osteoarthritis of right knee Baseline aches. Continue Doxy.      Constipation Well managed with Colace bid.    Hypothyroidism 10/23/14 TSH 8.754-increased Levothyroxine to 59mcg po daily 12/12/14 pending TSH     Family/ Staff Communication: observe the patient.   Goals of Care: SNF  Labs/tests ordered: none

## 2014-12-12 NOTE — Assessment & Plan Note (Signed)
Pain is controlled with Tylenol 500mg  bid and Norco 5/325 1/2 qhs.

## 2014-12-12 NOTE — Assessment & Plan Note (Signed)
Well managed with Colace bid.

## 2014-12-18 LAB — TSH: TSH: 4.02 u[IU]/mL (ref 0.41–5.90)

## 2014-12-19 ENCOUNTER — Other Ambulatory Visit: Payer: Self-pay | Admitting: Nurse Practitioner

## 2015-01-09 ENCOUNTER — Non-Acute Institutional Stay (SKILLED_NURSING_FACILITY): Payer: Medicare Other | Admitting: Nurse Practitioner

## 2015-01-09 ENCOUNTER — Encounter: Payer: Self-pay | Admitting: Nurse Practitioner

## 2015-01-09 DIAGNOSIS — M1711 Unilateral primary osteoarthritis, right knee: Secondary | ICD-10-CM | POA: Diagnosis not present

## 2015-01-09 DIAGNOSIS — M25511 Pain in right shoulder: Secondary | ICD-10-CM | POA: Diagnosis not present

## 2015-01-09 DIAGNOSIS — I480 Paroxysmal atrial fibrillation: Secondary | ICD-10-CM

## 2015-01-09 DIAGNOSIS — E039 Hypothyroidism, unspecified: Secondary | ICD-10-CM

## 2015-01-09 DIAGNOSIS — I10 Essential (primary) hypertension: Secondary | ICD-10-CM | POA: Diagnosis not present

## 2015-01-09 DIAGNOSIS — I509 Heart failure, unspecified: Secondary | ICD-10-CM | POA: Diagnosis not present

## 2015-01-09 DIAGNOSIS — F332 Major depressive disorder, recurrent severe without psychotic features: Secondary | ICD-10-CM

## 2015-01-09 NOTE — Assessment & Plan Note (Signed)
0/30/15 CXR no acute infiltrate or pleural effusion, borderline to minimal cardiomegaly with pulmonary edema consistent with mild congestive heart failure.  05/30/14 increase Furosemide to 40mg  daily from 20mg /40mg  alternating dose prior. (05/26/14 furosemide 60mg  x2 days-cough was improved. Avelox dc'd).  06/02/14 BNP 71.0 --compensated clinically.

## 2015-01-09 NOTE — Assessment & Plan Note (Signed)
Pain is controlled with Tylenol 500mg  bid and Norco 5/325 1/2 qhs.

## 2015-01-09 NOTE — Assessment & Plan Note (Signed)
Baseline aches. Continue Doxy.

## 2015-01-09 NOTE — Assessment & Plan Note (Signed)
Managed with Cymbalta 30mg . Her usual state of health. She smiles, eats, sleep as usual. No complaints. She participates therapy or facility activity when she desires

## 2015-01-09 NOTE — Progress Notes (Signed)
Patient ID: [name], female   DOB: 12-01-1914, 81 y.o.   MRN: [id number]   Code Status: DNR  Allergies  Allergen Reactions  . Amiodarone   . Penicillins   . Amoxicillin   . Celecoxib   . Ciprofloxacin   . Codeine   . Diltiazem   . Lactobacillus [Acidophilus Lactobacillus]   . Metoprolol Succinate     All Beta-blockers  . Metronidazole Hcl   . Other     floricene dye netribudazike    Chief Complaint  Patient presents with  . Medical Management of Chronic Issues    HPI: Patient is a 81 y.o. female seen in the SNF at [facility] today for evaluation of chronic medical conditions.  Problem List Items Addressed This Visit    Pain in joint, shoulder region - Primary (Chronic)    Pain is controlled with Tylenol 500mg  bid and Norco 5/325 1/2 qhs.        Major depressive disorder, recurrent episode, severe    Managed with Cymbalta 30mg . Her usual state of health. She smiles, eats, sleep as usual. No complaints. She participates therapy or facility activity when she desires        Essential hypertension    Controlled, continue Metoprolol 12.5mg  daily.         Atrial fibrillation    Rate controlled, continue anticoagulation with Coumadin . Takes Metoprolol 12.5mg  daily. INR therapeutic presently.         Congestive heart failure    0/30/15 CXR no acute infiltrate or pleural effusion, borderline to minimal cardiomegaly with pulmonary edema consistent with mild congestive heart failure.  05/30/14 increase Furosemide to 40mg  daily from 20mg /40mg  alternating dose prior. (05/26/14 furosemide 60mg  x2 days-cough was improved. Avelox dc'd).  06/02/14 BNP 71.0 --compensated clinically.       Osteoarthritis of right knee    Baseline aches. Continue Doxy.          Hypothyroidism    10/23/14 TSH 8.754-increased Levothyroxine to 37mcg po daily 12/12/14 pending TSH          Review of Systems:  Review of Systems  Constitutional: Negative for fever,  chills and diaphoresis.  HENT: Positive for hearing loss. Negative for congestion, ear discharge, ear pain, nosebleeds, sore throat and tinnitus.   Eyes: Negative for photophobia, pain, discharge and redness.  Respiratory: Negative for cough, shortness of breath, wheezing and stridor.   Cardiovascular: Positive for leg swelling. Negative for chest pain and palpitations.       Trace  Gastrointestinal: Negative for nausea, vomiting, abdominal pain, diarrhea, constipation and blood in stool.  Endocrine: Negative for polydipsia.  Genitourinary: Positive for frequency. Negative for dysuria, urgency, hematuria and flank pain.  Musculoskeletal: Negative for myalgias, back pain and neck pain.       Shoulder pain L>R  Skin: Negative for rash.       Chronic venous insufficiency BLE Ecchymoses abd  Allergic/Immunologic: Negative for environmental allergies.  Neurological: Negative for dizziness, tremors, seizures, weakness and headaches.  Hematological: Bruises/bleeds easily.  Psychiatric/Behavioral: Negative for suicidal ideas and hallucinations. The patient is nervous/anxious.      Past Medical History  Diagnosis Date  . Anemia   . Arthritis   . CHF (congestive heart failure)   . Depression   . Hypertension   . Chronic kidney disease   . Thyroid disease    History reviewed. No pertinent past surgical history. Social History:   has no tobacco, alcohol, and drug history on file.  No family history on file.  Medications: Patient's Medications  New Prescriptions   No medications on file  Previous Medications   ACETAMINOPHEN (TYLENOL) 325 MG TABLET    Take 325 mg by mouth every 6 (six) hours as needed.     ACETAMINOPHEN (TYLENOL) 500 MG TABLET    Take 1 caplet by mouth twice daily   BETA CAROTENE W/MINERALS (OCUVITE) TABLET    Take 1 tablet by mouth daily.     CALCIUM-VITAMIN D (OSCAL WITH D) 500-200 MG-UNIT PER TABLET    Take 1 tablet by mouth 2 (two) times daily.      CAMPHOR-EUCALYPTUS-MENTHOL (VICKS VAPORUB) 4.7-1.2-2.6 % OINT    Apply topically. Rub on chest as directed  (may keep at bedside)   DOCUSATE SODIUM (COLACE) 100 MG CAPSULE    Take 100 mg by mouth 2 (two) times daily.     DOXYCYCLINE (VIBRAMYCIN) 100 MG CAPSULE    Take 100 mg by mouth 2 (two) times daily.   DULOXETINE (CYMBALTA) 30 MG CAPSULE    Take 30 mg by mouth daily.     FOLIC ACID (FOLVITE) 1 MG TABLET    Take 1 mg by mouth daily.     FUROSEMIDE (LASIX) 20 MG TABLET    Take 20 mg by mouth daily. ONE DAY 20 NEXT DAY 40 AND ALTERNATE.   HYDROCODONE-ACETAMINOPHEN (NORCO/VICODIN) 5-325 MG PER TABLET    Take 1/2 tablet by mouth at bedtime   LEVOTHYROXINE (SYNTHROID, LEVOTHROID) 50 MCG TABLET    Take 50 mcg by mouth daily before breakfast.   MAGNESIUM OXIDE (MAG-OX) 400 MG TABLET    Take 400 mg by mouth daily.     METOPROLOL SUCCINATE (TOPROL-XL) 25 MG 24 HR TABLET    Take 25 mg by mouth daily. Taking 1/2 tab    MULTIPLE VITAMIN (MULTIVITAMIN) TABLET    Take 1 tablet by mouth daily.     POTASSIUM CHLORIDE (MICRO-K) 10 MEQ CR CAPSULE    Take 10 mEq by mouth daily. Takes 20 meq (2) 10 meq daily    SENNOSIDES-DOCUSATE SODIUM (SENOKOT-S) 8.6-50 MG TABLET    Take by mouth daily.     WARFARIN (COUMADIN) 4 MG TABLET    Take 4 mg by mouth daily. Taking 4 mg alt with 5mg   Modified Medications   No medications on file  Discontinued Medications   No medications on file     Physical Exam: Physical Exam  Constitutional: She is oriented to person, place, and time. She appears well-developed and well-nourished. No distress.  HENT:  Head: Normocephalic and atraumatic.  Clear nasal drainage seen  Eyes: Conjunctivae and EOM are normal. Pupils are equal, round, and reactive to light.  Neck: Normal range of motion. Neck supple. No JVD present. No thyromegaly present.  Cardiovascular: Normal rate.  Frequent extrasystoles are present.  Murmur heard. Pulses:      Dorsalis pedis pulses are 2+ on the right  side, and 1+ on the left side.  EM 2/6  Pulmonary/Chest: She has no wheezes. She has no rales. She exhibits no tenderness.  Abdominal: Soft. Bowel sounds are normal.  Musculoskeletal: She exhibits edema (left knee) and tenderness.       Left shoulder: She exhibits decreased range of motion, tenderness, crepitus and pain.       Legs: Anterior distal femur 2x2cm firm nodule palpated, fixed, hard, non tender. Reduced ROM of the R+L shoulder for over head activities. Left wrist/left shoulder/left knee pain on and off. Crepitus to L shoulder,  endorses paint with ROM. LLE trace edeam. Chronic L knee pain with mild heat.   Lymphadenopathy:    She has no cervical adenopathy.  Neurological: She is alert and oriented to person, place, and time. She has normal reflexes. No cranial nerve deficit. She exhibits normal muscle tone. Coordination normal.  Skin: Skin is dry. No rash noted. She is not diaphoretic. There is erythema (left knee).  baseline of mild heat of the left knee. Dark pigmented anterior RLE. Worsened R knee pain with mild heat.  abd ecchymosis w/o identified etiology.   Psychiatric: She has a normal mood and affect. Her behavior is normal. Judgment and thought content normal. Cognition and memory are impaired. She exhibits abnormal recent memory.   Filed Vitals:   01/09/15 1407  BP: 130/87  Pulse: 72  Temp: 98.4 F (36.9 C)  TempSrc: Tympanic  Resp: 20      Labs reviewed: Basic Metabolic Panel:  Recent Labs  02/04/14 02/13/14 06/02/14 10/23/14 12/18/14  NA 135* 134* 138 139  --   K 4.1 4.9 4.7 4.5  --   BUN 26* 24* 25*  --   --   CREATININE 1.2* 1.2* 1.4* 1.1  --   TSH  --   --   --   --  4.02   Liver Function Tests:  Recent Labs  02/13/14  AST 17  ALT 12  ALKPHOS 60   No results for input(s): LIPASE, AMYLASE in the last 8760 hours. No results for input(s): AMMONIA in the last 8760 hours. CBC:  Recent Labs  01/16/14 02/04/14 02/13/14  WBC 21.2 31.7 29.3  HGB  11.0* 11.9* 11.3*  HCT 33* 34* 33*  PLT 100* 99* 130*   Lipid Panel: No results for input(s): CHOL, HDL, LDLCALC, TRIG, CHOLHDL, LDLDIRECT in the last 8760 hours.  Past Procedures:  05/26/14 CXR no acute infiltrate or pleural effusion, borderline to minimal cardiomegaly with pulmonary edema consistent with mild congestive heart failure.    Assessment/Plan Pain in joint, shoulder region Pain is controlled with Tylenol 500mg  bid and Norco 5/325 1/2 qhs.    Major depressive disorder, recurrent episode, severe Managed with Cymbalta 30mg . Her usual state of health. She smiles, eats, sleep as usual. No complaints. She participates therapy or facility activity when she desires    Essential hypertension Controlled, continue Metoprolol 12.5mg  daily.     Atrial fibrillation Rate controlled, continue anticoagulation with Coumadin . Takes Metoprolol 12.5mg  daily. INR therapeutic presently.     Congestive heart failure 0/30/15 CXR no acute infiltrate or pleural effusion, borderline to minimal cardiomegaly with pulmonary edema consistent with mild congestive heart failure.  05/30/14 increase Furosemide to 40mg  daily from 20mg /40mg  alternating dose prior. (05/26/14 furosemide 60mg  x2 days-cough was improved. Avelox dc'd).  06/02/14 BNP 71.0 --compensated clinically.   Osteoarthritis of right knee Baseline aches. Continue Doxy.      Hypothyroidism 10/23/14 TSH 8.754-increased Levothyroxine to 68mcg po daily 12/12/14 pending TSH     Family/ Staff Communication: observe the patient.   Goals of Care: SNF  Labs/tests ordered: none

## 2015-01-09 NOTE — Assessment & Plan Note (Signed)
Controlled, continue Metoprolol 12.5mg  daily.

## 2015-01-09 NOTE — Assessment & Plan Note (Signed)
10/23/14 TSH 8.754-increased Levothyroxine to 9mcg po daily 12/12/14 pending TSH

## 2015-01-09 NOTE — Assessment & Plan Note (Signed)
Rate controlled, continue anticoagulation with Coumadin . Takes Metoprolol 12.5mg  daily. INR therapeutic presently.

## 2015-02-28 ENCOUNTER — Non-Acute Institutional Stay (SKILLED_NURSING_FACILITY): Payer: Medicare Other | Admitting: Adult Health

## 2015-02-28 DIAGNOSIS — I480 Paroxysmal atrial fibrillation: Secondary | ICD-10-CM | POA: Diagnosis not present

## 2015-02-28 DIAGNOSIS — M25551 Pain in right hip: Secondary | ICD-10-CM

## 2015-02-28 DIAGNOSIS — M1711 Unilateral primary osteoarthritis, right knee: Secondary | ICD-10-CM

## 2015-02-28 DIAGNOSIS — I10 Essential (primary) hypertension: Secondary | ICD-10-CM

## 2015-02-28 DIAGNOSIS — K5900 Constipation, unspecified: Secondary | ICD-10-CM

## 2015-02-28 DIAGNOSIS — I509 Heart failure, unspecified: Secondary | ICD-10-CM | POA: Diagnosis not present

## 2015-02-28 DIAGNOSIS — E039 Hypothyroidism, unspecified: Secondary | ICD-10-CM | POA: Diagnosis not present

## 2015-02-28 DIAGNOSIS — N390 Urinary tract infection, site not specified: Secondary | ICD-10-CM

## 2015-03-02 ENCOUNTER — Other Ambulatory Visit: Payer: Self-pay | Admitting: Hematology & Oncology

## 2015-03-02 DIAGNOSIS — C9200 Acute myeloblastic leukemia, not having achieved remission: Secondary | ICD-10-CM

## 2015-03-07 ENCOUNTER — Ambulatory Visit (HOSPITAL_BASED_OUTPATIENT_CLINIC_OR_DEPARTMENT_OTHER): Payer: Medicare Other | Admitting: Family

## 2015-03-07 ENCOUNTER — Other Ambulatory Visit (HOSPITAL_COMMUNITY)
Admission: RE | Admit: 2015-03-07 | Discharge: 2015-03-07 | Disposition: A | Discharge status: Home / Self Care | Payer: Medicare Other | Source: Ambulatory Visit | Attending: Hematology & Oncology | Admitting: Hematology & Oncology

## 2015-03-07 ENCOUNTER — Other Ambulatory Visit (HOSPITAL_BASED_OUTPATIENT_CLINIC_OR_DEPARTMENT_OTHER): Payer: Medicare Other

## 2015-03-07 ENCOUNTER — Encounter: Payer: Self-pay | Admitting: Family

## 2015-03-07 VITALS — BP 117/47 | HR 66 | Temp 97.3°F | Resp 16

## 2015-03-07 DIAGNOSIS — C9200 Acute myeloblastic leukemia, not having achieved remission: Secondary | ICD-10-CM

## 2015-03-07 DIAGNOSIS — I4891 Unspecified atrial fibrillation: Secondary | ICD-10-CM | POA: Diagnosis not present

## 2015-03-07 LAB — MANUAL DIFFERENTIAL (CHCC SATELLITE)
ALC: 21.4 10*3/uL — AB (ref 0.6–2.2)
ANC (CHCC MAN DIFF): 2.2 10*3/uL (ref 1.5–6.7)
LYMPH: 48 % (ref 14–48)
MONO: 2 % (ref 0–13)
Other Cells: 45 % — ABNORMAL HIGH (ref 0–0)
PLT EST ~~LOC~~: DECREASED
Platelet Morphology: NORMAL
SEG: 5 % — AB (ref 40–75)

## 2015-03-07 LAB — CBC WITH DIFFERENTIAL (CANCER CENTER ONLY)
HEMATOCRIT: 38.1 % (ref 34.8–46.6)
HGB: 12 g/dL (ref 11.6–15.9)
MCH: 29.8 pg (ref 26.0–34.0)
MCHC: 31.5 g/dL — AB (ref 32.0–36.0)
MCV: 95 fL (ref 81–101)
PLATELETS: 82 10*3/uL — AB (ref 145–400)
RBC: 4.03 10*6/uL (ref 3.70–5.32)
RDW: 17.1 % — ABNORMAL HIGH (ref 11.1–15.7)
WBC: 44.6 10*3/uL — ABNORMAL HIGH (ref 3.9–10.0)

## 2015-03-07 LAB — CHCC SATELLITE - SMEAR

## 2015-03-07 NOTE — Progress Notes (Signed)
Hematology and Oncology Follow Up Visit  [name] [id number] December 31, 1914 81 y.o. 03/07/2015   Principle Diagnosis:  1. Anemia of renal insufficiency. 2. Intermittent iron-deficiency anemia. 3. Acute Myeloid Leukemia   Current Therapy:   IV iron as indicated    Interim History:  [name] is here today with her daughter for a follow-up. We last saw her in September of 2014. She is here today with c/o fatigue and ecchymosis of the arms.  She is on Coumadin for atrial fib and has had no episodes of bleeding. Her INR is monitored at [facility] where [facility] lives. She is not anemic at this time but her platelet count is down at 82 and WBC count is up to 44.6. She denies fever, chills, n/v, cough, rash, dizziness, SOB, chest pain, palpitations, abdominal pain, constipation, diarrhea, blood in urine or stool.  She has had no swelling, tenderness, numbness or tingling in her extremities. She does have arthritis in her hands which bothers her from time to time. She denies pain at this time.  She has a good appetite and is eating. She is staying hydrated. She denies any significant weight loss or gain.    Medications:    Medication List       This list is accurate as of: 03/07/15  1:39 PM.  Always use your most recent med list.               acetaminophen 500 MG tablet  Commonly known as:  TYLENOL  Take 1 caplet by mouth twice daily     acetaminophen 325 MG tablet  Commonly known as:  TYLENOL  Take 325 mg by mouth every 6 (six) hours as needed.     beta carotene w/minerals tablet  Take 1 tablet by mouth daily.     calcium-vitamin D 500-200 MG-UNIT per tablet  Commonly known as:  OSCAL WITH D  Take 1 tablet by mouth 2 (two) times daily.     docusate sodium 100 MG capsule  Commonly known as:  COLACE  Take 100 mg by mouth 2 (two) times daily.     doxycycline 100 MG capsule  Commonly known as:  VIBRAMYCIN  Take 100 mg by mouth 2 (two) times daily.     DULoxetine 30 MG capsule  Commonly known as:  CYMBALTA  Take 30 mg by mouth daily.     folic acid 1 MG tablet  Commonly known as:  FOLVITE  Take 1 mg by mouth daily.     furosemide 20 MG tablet  Commonly known as:  LASIX  Take 20 mg by mouth daily. ONE DAY 20 NEXT DAY 40 AND ALTERNATE.     HYDROcodone-acetaminophen 5-325 MG per tablet  Commonly known as:  NORCO/VICODIN  Take 1/2 tablet by mouth at bedtime     levothyroxine 50 MCG tablet  Commonly known as:  SYNTHROID, LEVOTHROID  Take 50 mcg by mouth daily before breakfast.     magnesium oxide 400 MG tablet  Commonly known as:  MAG-OX  Take 400 mg by mouth daily.     metoprolol succinate 25 MG 24 hr tablet  Commonly known as:  TOPROL-XL  Take 25 mg by mouth daily. Taking 1/2 tab     multivitamin tablet  Take 1 tablet by mouth daily.     potassium chloride 10 MEQ CR capsule  Commonly known as:  MICRO-K  Take 10 mEq by mouth daily. Takes 20 meq (2) 10 meq daily  sennosides-docusate sodium 8.6-50 MG tablet  Commonly known as:  SENOKOT-S  Take by mouth daily.     VICKS VAPORUB 4.7-1.2-2.6 % Oint  Apply topically. Rub on chest as directed  (may keep at bedside)     warfarin 4 MG tablet  Commonly known as:  COUMADIN  Take 4 mg by mouth daily. Taking 4 mg alt with 5mg         Allergies:  Allergies  Allergen Reactions  . Amiodarone   . Penicillins   . Amoxicillin   . Celecoxib   . Ciprofloxacin   . Codeine   . Diltiazem   . Lactobacillus [Acidophilus Lactobacillus]   . Metoprolol Succinate     All Beta-blockers  . Metronidazole Hcl   . Other     floricene dye netribudazike    Past Medical History, Surgical history, Social history, and Family History were reviewed and updated.  Review of Systems: All other 10 point review of systems is negative.   Physical Exam:  oral temperature is 97.3 F (36.3 C). Her blood pressure is 117/47 and her pulse is 66. Her respiration is 16.   Wt Readings from Last 3  Encounters:  10/19/14 193 lb 3.2 oz (87.635 kg)  05/02/14 197 lb 8 oz (89.585 kg)  03/01/14 196 lb 8 oz (89.132 kg)    Ocular: Sclerae unicteric, pupils equal, round and reactive to light Ear-nose-throat: Oropharynx clear, dentition fair Lymphatic: No cervical or supraclavicular adenopathy Lungs no rales or rhonchi, good excursion bilaterally Heart regular rate and rhythm, no murmur appreciated Abd soft, nontender, positive bowel sounds MSK no focal spinal tenderness, no joint edema Neuro: non-focal, well-oriented, appropriate affect Breasts: Deferred  Lab Results  Component Value Date   WBC 29.3 02/13/2014   HGB 11.3* 02/13/2014   HCT 33* 02/13/2014   MCV 95 04/13/2013   PLT 130* 02/13/2014   Lab Results  Component Value Date   FERRITIN 223 04/13/2013   IRON 48 04/13/2013   TIBC 209* 04/13/2013   UIBC 161 04/13/2013   IRONPCTSAT 23 04/13/2013   Lab Results  Component Value Date   RETICCTPCT 1.4 01/09/2012   RBC 4.04 04/13/2013   RETICCTABS 59.4 01/09/2012   No results found for: KPAFRELGTCHN, LAMBDASER, KAPLAMBRATIO No results found for: Osborne Casco Lab Results  Component Value Date   TOTALPROTELP 4.9* 03/25/2011   ALBUMINELP 56.5 03/25/2011   A1GS 8.7* 03/25/2011   A2GS 14.6* 03/25/2011   BETS 5.1 03/25/2011   BETA2SER 4.4 03/25/2011   GAMS 10.7* 03/25/2011   MSPIKE NOT DET 03/25/2011   SPEI * 03/25/2011     Chemistry      Component Value Date/Time   NA 139 10/23/2014   NA 135 03/25/2011 1003   NA 135 03/09/2011 0500   K 4.5 10/23/2014   K 4.7 03/25/2011 1003   CL 97* 03/25/2011 1003   CL 103 03/09/2011 0500   CO2 29 03/25/2011 1003   CO2 27 03/09/2011 0500   BUN 25* 06/02/2014   BUN 10 03/25/2011 1003   BUN 12 03/09/2011 0500   CREATININE 1.1 10/23/2014   CREATININE 0.8 03/25/2011 1003   CREATININE 0.61 03/09/2011 0500   GLU 99 10/23/2014      Component Value Date/Time   CALCIUM 8.9 03/25/2011 1003   CALCIUM 8.0* 03/09/2011 0500    ALKPHOS 60 02/13/2014   ALKPHOS 54 03/25/2011 1003   AST 17 02/13/2014   AST 20 03/25/2011 1003   ALT 12 02/13/2014   ALT 8* 03/25/2011  1003   BILITOT 0.60 03/25/2011 1003   BILITOT 0.3 02/24/2011 1230     Impression and Plan: [name] is a pleasant 81 yo white female with multifactorial hematologic issues. She now has acute myeloid leukemia. Her WBC count is 44.6. She is not anemic at this time. Her platelets are trending downward at 82. She is having some bruising on the arms as well as fatigue. Despite these issues she is in good spirits and enjoying living at [facility].  Her quality of life is what is most important to she and her family. We will follow along with her as needed. She will have her CBC drawn weekly at her assisted living facility. Her daughter knows to contact us with any questions or concerns.   This was a shared visit with Dr. Marin Olp and he is in agreement with the aforementioned. He did view her blood smear and went over all her results in detail with the patient's daughter.    Eliezer Bottom, NP 8/10/20161:39 PM    Addendum: I saw and examined the patient. I would get the blood under the microscope. She clearly has acute myeloid leukemia. She has groups of myeloblasts. They almost look like monoblasts.  I spoke to her daughter in private. Her daughter said that Ms. Chandler probably would not want to know what is going on. I am certainly okay with that. Given that she is 81 years old, I think that we just need to focus on her quality of life.  I would want to make sure that her platelet count doesn't drop. I told her to stop the Coumadin. I do not want her bleeding.  I wrote her a prescription to have her labs drawn at her assisted living facility weekly. If we need to transfuse her platelets, we can certainly do this.  I told her daughter that I would be surprised if she made it to the end of the year. However, I suppose I could be wrong about  this. However, I would be very surprised if she were still with [place] at that time.  Her daughter clearly has a good understanding of what is going on. She just wants her mom to have quality of life.  I don't think we have to get hospice involved as of yet.  I told her daughter that we can always get her out to the office if any problems do arise.  Lum Keas

## 2015-03-08 ENCOUNTER — Other Ambulatory Visit: Payer: Self-pay | Admitting: *Deleted

## 2015-03-08 DIAGNOSIS — D509 Iron deficiency anemia, unspecified: Secondary | ICD-10-CM

## 2015-03-11 NOTE — Progress Notes (Signed)
Patient ID: [name], female   DOB: August 28, 1914, 81 y.o.   MRN: [id number]    [facility]ility[facility]        Allergies  Allergen Reactions  . Amiodarone   . Penicillins   . Amoxicillin   . Celecoxib   . Ciprofloxacin   . Codeine   . Diltiazem   . Lactobacillus [Acidophilus Lactobacillus]   . Metoprolol Succinate     All Beta-blockers  . Metronidazole Hcl   . Other     floricene dye netribudazike    Chief Complaint  Patient presents with  . Medical Management of Chronic Issues    HPI:  She is a long term resident of this facility being seen for the management of her chronic illnesses. She has a cough. Her family states she has had the cough for 2 days. She tells me that she has had this cough "forever". There is no shortness of breath; chest pain; no fever; no change in appetite.  Her family is concerned about pneumonia   Past Medical History  Diagnosis Date  . Anemia   . Arthritis   . CHF (congestive heart failure)   . Depression   . Hypertension   . Chronic kidney disease   . Thyroid disease     No past surgical history on file.  VITAL SIGNS BP 119/79 mmHg  Pulse 70  Patient's Medications  New Prescriptions   No medications on file  Previous Medications   ACETAMINOPHEN (TYLENOL) 325 MG TABLET    Take 325 mg by mouth every 6 (six) hours as needed.     ACETAMINOPHEN (TYLENOL) 500 MG TABLET    Take 1 caplet by mouth twice daily   BETA CAROTENE W/MINERALS (OCUVITE) TABLET    Take 1 tablet by mouth daily.     CALCIUM-VITAMIN D (OSCAL WITH D) 500-200 MG-UNIT PER TABLET    Take 1 tablet by mouth 2 (two) times daily.     CAMPHOR-EUCALYPTUS-MENTHOL (VICKS VAPORUB) 4.7-1.2-2.6 % OINT    Apply topically. Rub on chest as directed  (may keep at bedside)   DOCUSATE SODIUM (COLACE) 100 MG CAPSULE    Take 100 mg by mouth 2 (two) times daily.     DOXYCYCLINE (VIBRAMYCIN) 100 MG CAPSULE    Take 100 mg by mouth 2 (two) times daily.   DULOXETINE (CYMBALTA) 30  MG CAPSULE    Take 30 mg by mouth daily.     FOLIC ACID (FOLVITE) 1 MG TABLET    Take 1 mg by mouth daily.     FUROSEMIDE (LASIX) 20 MG TABLET    Take 20 mg by mouth daily. ONE DAY 20 NEXT DAY 40 AND ALTERNATE.   HYDROCODONE-ACETAMINOPHEN (NORCO/VICODIN) 5-325 MG PER TABLET    Take 1/2 tablet by mouth at bedtime   LEVOTHYROXINE (SYNTHROID, LEVOTHROID) 50 MCG TABLET    Take 50 mcg by mouth daily before breakfast.   MAGNESIUM OXIDE (MAG-OX) 400 MG TABLET    Take 400 mg by mouth daily.     METOPROLOL SUCCINATE (TOPROL-XL) 25 MG 24 HR TABLET    Take 25 mg by mouth daily. Taking 1/2 tab    MULTIPLE VITAMIN (MULTIVITAMIN) TABLET    Take 1 tablet by mouth daily.     POTASSIUM CHLORIDE (MICRO-K) 10 MEQ CR CAPSULE    Take 10 mEq by mouth daily. Takes 20 meq (2) 10 meq daily    SENNOSIDES-DOCUSATE SODIUM (SENOKOT-S) 8.6-50 MG TABLET    Take by mouth daily.  Modified Medications   No medications on file  Discontinued Medications   No medications on file     SIGNIFICANT DIAGNOSTIC EXAMS  LABS REVIEWED:   10-23-14: glucose 99; bun 25; creat 1.11; k+4.5; na++139; tsh 8.754 12-18-14: tsh 4.023 02-01-15: inr 2.28    Review of Systems  Constitutional: Negative for appetite change and fatigue.  HENT: Negative for congestion.   Respiratory: Positive for choking. Negative for cough, chest tightness and shortness of breath.   Cardiovascular: Negative for chest pain, palpitations and leg swelling.  Gastrointestinal: Negative for nausea, abdominal pain, diarrhea and constipation.  Musculoskeletal: Negative for myalgias and arthralgias.  Skin: Negative for pallor.  Neurological: Negative for dizziness.  Psychiatric/Behavioral: The patient is not nervous/anxious.       Physical Exam  Constitutional: No distress.  Thin   Eyes: Conjunctivae are normal.  Neck: Neck supple. No JVD present. No thyromegaly present.  Cardiovascular: Normal rate, regular rhythm and intact distal pulses.   Respiratory:  Effort normal and breath sounds normal. No respiratory distress. She has no wheezes.  GI: Soft. Bowel sounds are normal. She exhibits no distension. There is no tenderness.  Musculoskeletal: She exhibits no edema.  Able to move all extremities   Lymphadenopathy:    She has no cervical adenopathy.  Neurological: She is alert.  Skin: Skin is warm and dry. She is not diaphoretic.  Psychiatric: She has a normal mood and affect.     ASSESSMENT/ PLAN:  1. Hypothyroidism: will continue synthroid 75 mcg daily tsh is 4.023  2. Afib: heart rate is stable; will continue coumadin 5 mg 5 days per week and 4 mg 2 days per week; will continue lopressor 12.5 mg daily for rate control will monitor   3. Chronic UTI: is on long term doxycyline 100 mg twice daily long term; no recent infection present   4. CHF: is presently stable; will continue lasix 40 mg daily with k+ 20 meq daily will monitor  5. Hypertension: will continue lopressor 12.5 mg daily   6. Constipation: will continue colace twice daily   7. Right hip pain and right knee osteoarthritis: will continue tylenol 500 gm twice daily and 1/2 vicodin 5/325 mg tab at hs.   8. Major depressive disorder: will continue cymbalta 30 gm daily   Will get a chest x-ray due to her cough; will check cbc; cmp; mag levels   Time spent with patient 45   minutes >50% time spent counseling; reviewing medical record; tests; labs; and developing future plan of care   Ok Edwards NP [facility]  Contact [phone] Monday through Friday 8am- 5pm  After hours call [phone]

## 2015-03-12 LAB — CBC AND DIFFERENTIAL
HEMATOCRIT: 34 % — AB (ref 36–46)
HEMOGLOBIN: 11.2 g/dL — AB (ref 12.0–16.0)
Platelets: 73 10*3/uL — AB (ref 150–399)
WBC: 35.7 10^3/mL

## 2015-03-12 LAB — FLOW CYTOMETRY - CHCC SATELLITE

## 2015-03-13 ENCOUNTER — Other Ambulatory Visit: Payer: Self-pay | Admitting: Nurse Practitioner

## 2015-03-13 DIAGNOSIS — D509 Iron deficiency anemia, unspecified: Secondary | ICD-10-CM

## 2015-03-13 DIAGNOSIS — D72829 Elevated white blood cell count, unspecified: Secondary | ICD-10-CM | POA: Insufficient documentation

## 2015-03-14 ENCOUNTER — Other Ambulatory Visit: Payer: Self-pay | Admitting: Hematology & Oncology

## 2015-03-14 DIAGNOSIS — C9110 Chronic lymphocytic leukemia of B-cell type not having achieved remission: Secondary | ICD-10-CM

## 2015-03-19 LAB — CBC AND DIFFERENTIAL
HCT: 38 % (ref 36–46)
HEMOGLOBIN: 12.6 g/dL (ref 12.0–16.0)
Platelets: 93 10*3/uL — AB (ref 150–399)
WBC: 39.4 10^3/mL

## 2015-03-20 ENCOUNTER — Other Ambulatory Visit: Payer: Self-pay | Admitting: *Deleted

## 2015-03-20 ENCOUNTER — Other Ambulatory Visit: Payer: Self-pay | Admitting: Nurse Practitioner

## 2015-03-20 DIAGNOSIS — C9200 Acute myeloblastic leukemia, not having achieved remission: Secondary | ICD-10-CM

## 2015-03-20 DIAGNOSIS — C9130 Prolymphocytic leukemia of B-cell type not having achieved remission: Secondary | ICD-10-CM | POA: Insufficient documentation

## 2015-03-22 ENCOUNTER — Telehealth: Payer: Self-pay | Admitting: Hematology & Oncology

## 2015-03-22 NOTE — Telephone Encounter (Signed)
Faxed medical records to:  [facility])  [place], [place]  [address] F: [phone] P: [phone]   Cont of care:

## 2015-03-30 ENCOUNTER — Encounter: Payer: Self-pay | Admitting: Nurse Practitioner

## 2015-03-30 ENCOUNTER — Non-Acute Institutional Stay (SKILLED_NURSING_FACILITY): Payer: Medicare Other | Admitting: Nurse Practitioner

## 2015-03-30 DIAGNOSIS — E039 Hypothyroidism, unspecified: Secondary | ICD-10-CM | POA: Diagnosis not present

## 2015-03-30 DIAGNOSIS — I5040 Unspecified combined systolic (congestive) and diastolic (congestive) heart failure: Secondary | ICD-10-CM

## 2015-03-30 DIAGNOSIS — I10 Essential (primary) hypertension: Secondary | ICD-10-CM | POA: Diagnosis not present

## 2015-03-30 DIAGNOSIS — M25512 Pain in left shoulder: Secondary | ICD-10-CM | POA: Diagnosis not present

## 2015-03-30 DIAGNOSIS — I482 Chronic atrial fibrillation, unspecified: Secondary | ICD-10-CM

## 2015-03-30 DIAGNOSIS — F332 Major depressive disorder, recurrent severe without psychotic features: Secondary | ICD-10-CM | POA: Diagnosis not present

## 2015-03-30 DIAGNOSIS — D509 Iron deficiency anemia, unspecified: Secondary | ICD-10-CM | POA: Diagnosis not present

## 2015-03-30 DIAGNOSIS — C9200 Acute myeloblastic leukemia, not having achieved remission: Secondary | ICD-10-CM | POA: Diagnosis not present

## 2015-03-30 NOTE — Assessment & Plan Note (Signed)
Controlled, continue Metoprolol 12.5mg daily.    

## 2015-03-30 NOTE — Assessment & Plan Note (Signed)
03/08/15 Hematology: IV iron, 03/19/15 wbc 39.4, cbc wkly, stopped coumadin

## 2015-03-30 NOTE — Assessment & Plan Note (Signed)
Stable, Hgb has been 11s.

## 2015-03-30 NOTE — Progress Notes (Signed)
Patient ID: [name], female   DOB: Dec 29, 1914, 81 y.o.   MRN: [id number]  Location:  SNF [facility] Provider:  Marlana Latus NP  Code Status:  DNR Goals of care: Advanced Directive information    Chief Complaint  Patient presents with  . Medical Management of Chronic Issues     HPI: Patient is a 81 y.o. female seen in the SNF at [facility] today for evaluation of chronic medical conditions. Hx of Afib, heart rate has been controlled on Metoprolol. Coumadin has been discontinued since her elevated wbc and diagnosed acute myeloid leukemia. F/u Oncology for AML. She is clinically compensated with CHF while taking Furosemide. Her Hgb is 11s presently. Hypothyroidism is well supplemented with Levothyroxine 65mcg, last TSH wnl. No s/s of depression while on Cymbalta, her multiple site OA pain is managed with Tylenol, Norco, and Cymbalta. The patient resides in SNF for continue care needs.   Review of Systems:  Review of Systems  Constitutional: Negative for fever and diaphoresis.  HENT: Positive for hearing loss. Negative for congestion, ear pain and nosebleeds.   Eyes: Negative for pain, discharge and redness.  Respiratory: Negative for cough, shortness of breath and wheezing.   Cardiovascular: Positive for leg swelling. Negative for chest pain and palpitations.       Trace  Gastrointestinal: Negative for nausea, vomiting, diarrhea and constipation.  Genitourinary: Positive for frequency. Negative for dysuria, urgency, hematuria and flank pain.  Musculoskeletal: Negative for myalgias, back pain and neck pain.       Shoulder pain L>R  Skin: Negative for rash.       Chronic venous insufficiency BLE  Neurological: Negative for dizziness, tremors, seizures, weakness and headaches.  Endo/Heme/Allergies: Bruises/bleeds easily.  Psychiatric/Behavioral: Positive for memory loss. Negative for suicidal ideas and hallucinations. The patient is nervous/anxious.        Forgetful    Past  Medical History  Diagnosis Date  . Anemia   . Arthritis   . CHF (congestive heart failure)   . Depression   . Hypertension   . Chronic kidney disease   . Thyroid disease     Patient Active Problem List   Diagnosis Date Noted  . Acute myeloid leukemia 03/20/2015  . Leukocytosis 03/13/2015  . Pain in right hip 10/19/2014  . Allergic rhinitis 05/16/2014  . Chronic pulmonary embolism 05/11/2014  . Hypothyroidism 05/11/2014  . UTI (urinary tract infection) 05/11/2014  . Chronic UTI (urinary tract infection) 05/11/2014  . Anticoagulation goal of INR 2 to 3 12/02/2013  . Fall 07/01/2013  . Constipation 05/31/2013  . Closed wedge compression fracture of T7 vertebra 03/08/2013  . CKD (chronic kidney disease), stage II 03/08/2013  . Congestive heart failure 10/29/2012  . Osteoarthritis of right knee 10/29/2012  . Iron deficiency anemia 10/22/2012  . Major depressive disorder, recurrent episode, severe 10/22/2012  . Essential hypertension 10/22/2012  . Other primary cardiomyopathies 10/22/2012  . Atrial fibrillation 10/22/2012  . Pain in joint, shoulder region 10/22/2012  . Shortness of breath 10/22/2012    Allergies  Allergen Reactions  . Amiodarone   . Penicillins   . Amoxicillin   . Celecoxib   . Ciprofloxacin   . Codeine   . Diltiazem   . Lactobacillus [Acidophilus Lactobacillus]   . Metoprolol Succinate     All Beta-blockers  . Metronidazole Hcl   . Other     floricene dye netribudazike    Medications: Patient's Medications  New Prescriptions   No medications on file  Previous Medications   ACETAMINOPHEN (TYLENOL) 325 MG TABLET    Take 325 mg by mouth every 6 (six) hours as needed.     ACETAMINOPHEN (TYLENOL) 500 MG TABLET    Take 1 caplet by mouth twice daily   BETA CAROTENE W/MINERALS (OCUVITE) TABLET    Take 1 tablet by mouth daily.     CALCIUM-VITAMIN D (OSCAL WITH D) 500-200 MG-UNIT PER TABLET    Take 1 tablet by mouth 2 (two) times daily.      CAMPHOR-EUCALYPTUS-MENTHOL (VICKS VAPORUB) 4.7-1.2-2.6 % OINT    Apply topically. Rub on chest as directed  (may keep at bedside)   DOCUSATE SODIUM (COLACE) 100 MG CAPSULE    Take 100 mg by mouth 2 (two) times daily.     DOXYCYCLINE (VIBRAMYCIN) 100 MG CAPSULE    Take 100 mg by mouth 2 (two) times daily.   DULOXETINE (CYMBALTA) 30 MG CAPSULE    Take 30 mg by mouth daily.     FOLIC ACID (FOLVITE) 1 MG TABLET    Take 1 mg by mouth daily.     FUROSEMIDE (LASIX) 20 MG TABLET    Take 20 mg by mouth daily. ONE DAY 20 NEXT DAY 40 AND ALTERNATE.   HYDROCODONE-ACETAMINOPHEN (NORCO/VICODIN) 5-325 MG PER TABLET    Take 1/2 tablet by mouth at bedtime   LEVOTHYROXINE (SYNTHROID, LEVOTHROID) 50 MCG TABLET    Take 50 mcg by mouth daily before breakfast.   MAGNESIUM OXIDE (MAG-OX) 400 MG TABLET    Take 400 mg by mouth daily.     METOPROLOL SUCCINATE (TOPROL-XL) 25 MG 24 HR TABLET    Take 25 mg by mouth daily. Taking 1/2 tab    MULTIPLE VITAMIN (MULTIVITAMIN) TABLET    Take 1 tablet by mouth daily.     POTASSIUM CHLORIDE (MICRO-K) 10 MEQ CR CAPSULE    Take 10 mEq by mouth daily. Takes 20 meq (2) 10 meq daily    SENNOSIDES-DOCUSATE SODIUM (SENOKOT-S) 8.6-50 MG TABLET    Take by mouth daily.    Modified Medications   No medications on file  Discontinued Medications   No medications on file    Physical Exam: Filed Vitals:   03/30/15 1233  BP: 127/61  Pulse: 65  Temp: 97.1 F (36.2 C)  TempSrc: Tympanic  Resp: 18   There is no weight on file to calculate BMI.  Physical Exam  Constitutional: She is oriented to person, place, and time. She appears well-developed and well-nourished. No distress.  HENT:  Head: Normocephalic and atraumatic.  Eyes: Conjunctivae and EOM are normal. Pupils are equal, round, and reactive to light.  Neck: Normal range of motion. Neck supple. No JVD present. No thyromegaly present.  Cardiovascular: Normal rate.  Frequent extrasystoles are present.  Murmur heard. Pulses:       Dorsalis pedis pulses are 2+ on the right side, and 1+ on the left side.  EM 2/6  Pulmonary/Chest: She has no wheezes. She has no rales. She exhibits no tenderness.  Abdominal: Soft. Bowel sounds are normal.  Musculoskeletal: She exhibits edema (left knee) and tenderness.       Left shoulder: She exhibits decreased range of motion, tenderness, crepitus and pain.       Legs: Anterior distal femur 2x2cm firm nodule palpated, fixed, hard, non tender. Reduced ROM of the R+L shoulder for over head activities. Left wrist/left shoulder/left knee pain on and off. Crepitus to L shoulder, endorses paint with ROM. LLE trace edeam. Chronic L knee pain  with mild heat.   Lymphadenopathy:    She has no cervical adenopathy.  Neurological: She is alert and oriented to person, place, and time. She has normal reflexes. No cranial nerve deficit. She exhibits normal muscle tone. Coordination normal.  Skin: Skin is dry. No rash noted. She is not diaphoretic. There is erythema (left knee).  baseline of mild heat of the left knee. Dark pigmented anterior RLE.   Psychiatric: She has a normal mood and affect. Her behavior is normal. Judgment and thought content normal. Cognition and memory are impaired. She exhibits abnormal recent memory.    Labs reviewed: Basic Metabolic Panel:  Recent Labs  06/02/14 10/23/14  NA 138 139  K 4.7 4.5  BUN 25*  --   CREATININE 1.4* 1.1    Liver Function Tests: No results for input(s): AST, ALT, ALKPHOS, BILITOT, PROT, ALBUMIN in the last 8760 hours.  CBC:  Recent Labs  03/07/15 1314 03/12/15 03/19/15  WBC 44.6* 35.7 39.4  HGB 12.0 11.2* 12.6  HCT 38.1 34* 38  MCV 95  --   --   PLT 82* 73* 93*    Lab Results  Component Value Date   TSH 4.02 12/18/2014   Lab Results  Component Value Date   HGBA1C 8.8* 10/23/2014   Lab Results  Component Value Date   CHOL  10/17/2010    112        ATP III CLASSIFICATION:  <200     mg/dL   Desirable  200-239  mg/dL    Borderline High  >=240    mg/dL   High          HDL 30* 10/17/2010   LDLCALC  10/17/2010    66        Total Cholesterol/HDL:CHD Risk Coronary Heart Disease Risk Table                     Men   Women  1/2 Average Risk   3.4   3.3  Average Risk       5.0   4.4  2 X Average Risk   9.6   7.1  3 X Average Risk  23.4   11.0        Use the calculated Patient Ratio above and the CHD Risk Table to determine the patient's CHD Risk.        ATP III CLASSIFICATION (LDL):  <100     mg/dL   Optimal  100-129  mg/dL   Near or Above                    Optimal  130-159  mg/dL   Borderline  160-189  mg/dL   High  >190     mg/dL   Very High   TRIG 81 10/17/2010   CHOLHDL 3.7 10/17/2010    Significant Diagnostic Results since last visit: none  Patient Care Team: Pamala Duffel, MD as PCP - General (Cardiology)  Assessment/Plan Problem List Items Addressed This Visit    Pain in joint, shoulder region - Primary (Chronic)    Pain is controlled, continue Tylenol 500mg  bid and Norco 5/325 1/2 qhs      Iron deficiency anemia    Stable, Hgb has been 11s.       Major depressive disorder, recurrent episode, severe    Mood has been stable, continue Cymbalta 30mg .      Essential hypertension    Controlled, continue Metoprolol 12.5mg  daily  Atrial fibrillation    Heart rate is in control, continue Metoprolol      Congestive heart failure    Compensated clinically, continue Furosemide, observe the patient.       Hypothyroidism    Last TSH 4.023 12/18/14, continue Levothyroxine 57mcg daily.       Acute myeloid leukemia    03/08/15 Hematology: IV iron, 03/19/15 wbc 39.4, cbc wkly, stopped coumadin          Family/ staff Communication: SNF for continue care needs.   Labs/tests ordered: none  ManXie Joycelin Radloff NP [facility] [address], [place] [address] On Call:  [phone] & follow prompts after 5pm & weekends Office  Phone:  [phone] Office Fax:  [phone]

## 2015-03-30 NOTE — Assessment & Plan Note (Signed)
Last TSH 4.023 12/18/14, continue Levothyroxine 63mcg daily.

## 2015-03-30 NOTE — Assessment & Plan Note (Signed)
Pain is controlled, continue Tylenol 500mg  bid and Norco 5/325 1/2 qhs

## 2015-03-30 NOTE — Assessment & Plan Note (Signed)
Compensated clinically, continue Furosemide, observe the patient.

## 2015-03-30 NOTE — Assessment & Plan Note (Signed)
Mood has been stable, continue Cymbalta 30mg .

## 2015-03-30 NOTE — Assessment & Plan Note (Addendum)
Heart rate is in control, continue Metoprolol  

## 2015-04-09 ENCOUNTER — Non-Acute Institutional Stay (SKILLED_NURSING_FACILITY): Payer: Medicare Other | Admitting: Internal Medicine

## 2015-04-09 ENCOUNTER — Encounter: Payer: Self-pay | Admitting: Internal Medicine

## 2015-04-09 DIAGNOSIS — F332 Major depressive disorder, recurrent severe without psychotic features: Secondary | ICD-10-CM | POA: Diagnosis not present

## 2015-04-09 DIAGNOSIS — I5040 Unspecified combined systolic (congestive) and diastolic (congestive) heart failure: Secondary | ICD-10-CM

## 2015-04-09 DIAGNOSIS — I482 Chronic atrial fibrillation, unspecified: Secondary | ICD-10-CM

## 2015-04-09 DIAGNOSIS — N182 Chronic kidney disease, stage 2 (mild): Secondary | ICD-10-CM | POA: Diagnosis not present

## 2015-04-09 DIAGNOSIS — E039 Hypothyroidism, unspecified: Secondary | ICD-10-CM | POA: Diagnosis not present

## 2015-04-09 DIAGNOSIS — R7309 Other abnormal glucose: Secondary | ICD-10-CM

## 2015-04-09 DIAGNOSIS — M25512 Pain in left shoulder: Secondary | ICD-10-CM

## 2015-04-09 DIAGNOSIS — C9290 Myeloid leukemia, unspecified, not having achieved remission: Secondary | ICD-10-CM | POA: Diagnosis not present

## 2015-04-09 DIAGNOSIS — I10 Essential (primary) hypertension: Secondary | ICD-10-CM | POA: Diagnosis not present

## 2015-04-09 DIAGNOSIS — C9210 Chronic myeloid leukemia, BCR/ABL-positive, not having achieved remission: Secondary | ICD-10-CM

## 2015-04-09 NOTE — Progress Notes (Signed)
Patient ID: [name], female   DOB: 07-21-1915, 81 y.o.   MRN: [id number]    [place] Room Number: N 11  Place of Service: SNF (31)     Allergies  Allergen Reactions  . Amiodarone   . Penicillins   . Amoxicillin   . Celecoxib   . Ciprofloxacin   . Codeine   . Diltiazem   . Lactobacillus [Acidophilus Lactobacillus]   . Metoprolol Succinate     All Beta-blockers  . Metronidazole Hcl   . Other     floricene dye netribudazike    Chief Complaint  Patient presents with  . Medical Management of Chronic Issues    HPI:  Chronic myeloid leukemia: Diagnosis was changed from acute myeloid leukemia to chronic myeloid leukemia today. Review previous records from Dr. Marin Olp suggests that he thought this was a chronic form, and clinically it is most consistent with chronic myelogenous leukemia.  Hemoglobin A1c 8.0 or greater-11 A1c was 8.8 in March 2016. It does not appear to have been repeated since then.  Essential hypertension - controlled  Pain in left shoulder - chronic discomfort in both shoulders recently worse in the left side.  Chronic atrial fibrillation - rate controlled. Patient previously took warfarin, but this was stopped due to the possibility of falls and because of her leukemia.  CKD (chronic kidney disease), stage II - lab appears to been stable  Hypothyroidism, unspecified hypothyroidism type - compensated  Major depressive disorder, recurrent, severe without psychotic features - controlled with Cymbalta  Combined systolic and diastolic congestive heart failure, unspecified congestive heart failure chronicity - compensated on current medication    Medications: Patient's Medications  New Prescriptions   No medications on file  Previous Medications   ACETAMINOPHEN (TYLENOL) 325 MG TABLET    Take 325 mg by mouth every 6 (six) hours as needed.     ACETAMINOPHEN (TYLENOL) 500 MG TABLET    Take 1 caplet by mouth twice  daily   BETA CAROTENE W/MINERALS (OCUVITE) TABLET    Take 1 tablet by mouth daily.     CALCIUM-VITAMIN D (OSCAL WITH D) 500-200 MG-UNIT PER TABLET    Take 1 tablet by mouth 2 (two) times daily.     CAMPHOR-EUCALYPTUS-MENTHOL (VICKS VAPORUB) 4.7-1.2-2.6 % OINT    Apply topically. Rub on chest as directed  (may keep at bedside)   DOCUSATE SODIUM (COLACE) 100 MG CAPSULE    Take 100 mg by mouth 2 (two) times daily.     DOXYCYCLINE (VIBRAMYCIN) 100 MG CAPSULE    Take 100 mg by mouth 2 (two) times daily.   DULOXETINE (CYMBALTA) 30 MG CAPSULE    Take 30 mg by mouth daily.     FOLIC ACID (FOLVITE) 1 MG TABLET    Take 1 mg by mouth daily.     FUROSEMIDE (LASIX) 20 MG TABLET    Take 20 mg by mouth daily. ONE DAY 20 NEXT DAY 40 AND ALTERNATE.   HYDROCODONE-ACETAMINOPHEN (NORCO/VICODIN) 5-325 MG PER TABLET    Take 1/2 tablet by mouth at bedtime   LEVOTHYROXINE (SYNTHROID, LEVOTHROID) 50 MCG TABLET    Take 50 mcg by mouth daily before breakfast.   MAGNESIUM OXIDE (MAG-OX) 400 MG TABLET    Take 400 mg by mouth daily.     METOPROLOL SUCCINATE (TOPROL-XL) 25 MG 24 HR TABLET    Take 25 mg by mouth daily. Taking 1/2 tab    MULTIPLE VITAMIN (MULTIVITAMIN) TABLET    Take  1 tablet by mouth daily.     POTASSIUM CHLORIDE (MICRO-K) 10 MEQ CR CAPSULE    Take 10 mEq by mouth daily. Takes 20 meq (2) 10 meq daily    SENNOSIDES-DOCUSATE SODIUM (SENOKOT-S) 8.6-50 MG TABLET    Take by mouth daily.    Modified Medications   No medications on file  Discontinued Medications   No medications on file     Review of Systems  Constitutional: Negative for fever and diaphoresis.  HENT: Positive for hearing loss. Negative for congestion, ear pain and nosebleeds.   Eyes: Negative for pain, discharge and redness.  Respiratory: Negative for cough, shortness of breath and wheezing.   Cardiovascular: Positive for leg swelling. Negative for chest pain and palpitations.       Trace  Gastrointestinal: Negative for nausea, vomiting,  diarrhea and constipation.  Genitourinary: Positive for frequency. Negative for dysuria, urgency, hematuria and flank pain.  Musculoskeletal: Negative for myalgias, back pain and neck pain.       Shoulder pain L>R  Skin: Negative for rash.       Chronic venous insufficiency BLE  Neurological: Negative for dizziness, tremors, seizures, weakness and headaches.  Hematological: Bruises/bleeds easily.  Psychiatric/Behavioral: Negative for suicidal ideas and hallucinations. The patient is nervous/anxious.        Forgetful    Filed Vitals:   04/09/15 1517  BP: 112/61  Pulse: 68  Temp: 97 F (36.1 C)  Resp: 18  Height: 5' 4.5" (1.638 m)  Weight: 190 lb (86.183 kg)  SpO2: 95%   Body mass index is 32.12 kg/(m^2).  Physical Exam  Constitutional: She is oriented to person, place, and time. She appears well-developed and well-nourished. No distress.  HENT:  Head: Normocephalic and atraumatic.  Eyes: Conjunctivae and EOM are normal. Pupils are equal, round, and reactive to light.  Neck: Normal range of motion. Neck supple. No JVD present. No thyromegaly present.  Cardiovascular: Normal rate.  Frequent extrasystoles are present.  Murmur heard. Pulses:      Dorsalis pedis pulses are 2+ on the right side, and 1+ on the left side.  EM 2/6  Pulmonary/Chest: She has no wheezes. She has no rales. She exhibits no tenderness.  Abdominal: Soft. Bowel sounds are normal. She exhibits no distension. There is no tenderness. There is no guarding.  Musculoskeletal: She exhibits edema (left knee) and tenderness.       Left shoulder: She exhibits decreased range of motion, tenderness, crepitus and pain.       Legs: Anterior distal femur 2x2cm firm nodule palpated, fixed, hard, non tender. Reduced ROM of the R+L shoulder for over head activities. Left wrist/left shoulder/left knee pain on and off. Crepitus to L shoulder, endorses paint with ROM. LLE trace edeam. Chronic L knee pain.  Lymphadenopathy:     She has no cervical adenopathy.  Neurological: She is alert and oriented to person, place, and time. She has normal reflexes. No cranial nerve deficit. She exhibits normal muscle tone. Coordination normal.  Skin: Skin is dry. No rash noted. She is not diaphoretic. There is erythema (left knee).  baseline of mild heat of the left knee. Dark pigmented anterior RLE.   Psychiatric: She has a normal mood and affect. Her behavior is normal. Judgment and thought content normal. Cognition and memory are impaired. She exhibits abnormal recent memory.     Labs reviewed: Lab Summary Latest Ref Rng 03/19/2015 03/12/2015 03/07/2015 10/23/2014 06/02/2014 02/13/2014  Hemoglobin 12.0 - 16.0 g/dL 12.6 11.2(A) 12.0 (None) (None)  11.3(A)  Hematocrit 36 - 46 % 38 34(A) 38.1 (None) (None) 33(A)  White count - 39.4 35.7 44.6(H) (None) (None) 29.3  Platelet count 150 - 399 K/L 93(A) 73(A) 82(L) (None) (None) 130(A)  Sodium 137 - 147 mmol/L (None) (None) (None) 139 138 134(A)  Potassium 3.4 - 5.3 mmol/L (None) (None) (None) 4.5 4.7 4.9  Calcium - (None) (None) (None) (None) (None) (None)  Phosphorus - (None) (None) (None) (None) (None) (None)  Creatinine 0.5 - 1.1 mg/dL (None) (None) (None) 1.1 1.4(A) 1.2(A)  AST 13 - 35 U/L (None) (None) (None) (None) (None) 17  Alk Phos 25 - 125 U/L (None) (None) (None) (None) (None) 60  Bilirubin - (None) (None) (None) (None) (None) (None)  Glucose - (None) (None) (None) 99 100 105  Cholesterol - (None) (None) (None) (None) (None) (None)  HDL cholesterol - (None) (None) (None) (None) (None) (None)  Triglycerides - (None) (None) (None) (None) (None) (None)  LDL Direct - (None) (None) (None) (None) (None) (None)  LDL Calc - (None) (None) (None) (None) (None) (None)  Total protein - (None) (None) (None) (None) (None) (None)  Albumin - (None) (None) (None) (None) (None) (None)   Lab Results  Component Value Date   TSH 4.02 12/18/2014   T4TOTAL 7.5 12/26/2010   Lab Results    Component Value Date   BUN 25* 06/02/2014   Lab Results  Component Value Date   HGBA1C 8.8* 10/23/2014       Assessment/Plan  1. Chronic myeloid leukemia -CBC, future  2. Hemoglobin A1c 8.0 or greater CMP, future-A1c, future A1c, future  3. Essential hypertension -CMP, future  4. Pain in left shoulder  attending current pain medications  5. Chronic atrial fibrillation  rate controlled  6. CKD (chronic kidney disease), stage II -CMP, future  7. Hypothyroidism, unspecified hypothyroidism type -TSH, future  8. Major depressive disorder, recurrent, severe without psychotic features  continue Cymbalta  9. Combined systolic and diastolic congestive heart failure, unspecified congestive heart failure chronicity  compensated on current medications

## 2015-04-12 LAB — TSH: TSH: 2.29 u[IU]/mL (ref 0.41–5.90)

## 2015-04-12 LAB — HEMOGLOBIN A1C: Hgb A1c MFr Bld: 5.9 % (ref 4.0–6.0)

## 2015-04-12 LAB — BASIC METABOLIC PANEL
BUN: 24 mg/dL — AB (ref 4–21)
CREATININE: 1.2 mg/dL — AB (ref 0.5–1.1)
GLUCOSE: 91 mg/dL
POTASSIUM: 4.4 mmol/L (ref 3.4–5.3)
SODIUM: 141 mmol/L (ref 137–147)

## 2015-04-12 LAB — HEPATIC FUNCTION PANEL
ALT: 11 U/L (ref 7–35)
AST: 20 U/L (ref 13–35)
Alkaline Phosphatase: 66 U/L (ref 25–125)
Bilirubin, Total: 0.3 mg/dL

## 2015-04-13 ENCOUNTER — Non-Acute Institutional Stay (SKILLED_NURSING_FACILITY): Payer: Medicare Other | Admitting: Nurse Practitioner

## 2015-04-13 ENCOUNTER — Encounter: Payer: Self-pay | Admitting: Nurse Practitioner

## 2015-04-13 DIAGNOSIS — I482 Chronic atrial fibrillation, unspecified: Secondary | ICD-10-CM

## 2015-04-13 DIAGNOSIS — I10 Essential (primary) hypertension: Secondary | ICD-10-CM | POA: Diagnosis not present

## 2015-04-13 DIAGNOSIS — N182 Chronic kidney disease, stage 2 (mild): Secondary | ICD-10-CM | POA: Diagnosis not present

## 2015-04-13 DIAGNOSIS — I509 Heart failure, unspecified: Secondary | ICD-10-CM | POA: Diagnosis not present

## 2015-04-13 DIAGNOSIS — F332 Major depressive disorder, recurrent severe without psychotic features: Secondary | ICD-10-CM

## 2015-04-13 DIAGNOSIS — E039 Hypothyroidism, unspecified: Secondary | ICD-10-CM

## 2015-04-13 DIAGNOSIS — R7309 Other abnormal glucose: Secondary | ICD-10-CM | POA: Diagnosis not present

## 2015-04-13 NOTE — Assessment & Plan Note (Signed)
04/12/15 Hgb A1c 5.9

## 2015-04-13 NOTE — Assessment & Plan Note (Signed)
Controlled, continue Metoprolol 12.5mg daily.    

## 2015-04-13 NOTE — Assessment & Plan Note (Signed)
01/05/14 Bun/creat 39/1.18 04/12/15 Bun/creat 24/1.18

## 2015-04-13 NOTE — Progress Notes (Signed)
Patient ID: [name], female   DOB: Feb 03, 1915, 81 y.o.   MRN: [id number]  Location:  SNF [facility] Provider:  Marlana Latus NP  Code Status:  DNR Goals of care: Advanced Directive information    Chief Complaint  Patient presents with  . Medical Management of Chronic Issues     HPI: Patient is a 81 y.o. female seen in the SNF at [facility] today for evaluation of chronic medical conditions. Hx Afib, HTN, blood sugar(HgbA1c 5.9 04/12/15) depression, dementia, multiple arthritic pain in shoulders and knees, hypothyroidism(TSH 2.286 04/12/15). 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 She is declining gradually. Continue to reside in SNF for care needs.   Review of Systems:  Review of Systems  Constitutional: Negative for fever and diaphoresis.  HENT: Positive for hearing loss. Negative for congestion, ear pain and nosebleeds.   Eyes: Negative for pain, discharge and redness.  Respiratory: Negative for cough, shortness of breath and wheezing.   Cardiovascular: Positive for leg swelling. Negative for chest pain and palpitations.       Trace  Gastrointestinal: Negative for nausea, vomiting, diarrhea and constipation.  Genitourinary: Positive for frequency. Negative for dysuria, urgency, hematuria and flank pain.  Musculoskeletal: Positive for joint pain. Negative for myalgias, back pain and neck pain.       Shoulder pain L>R  Skin: Negative for rash.       Chronic venous insufficiency BLE  Neurological: Negative for dizziness, tremors, seizures, weakness and headaches.  Endo/Heme/Allergies: Bruises/bleeds easily.  Psychiatric/Behavioral: Positive for memory loss. Negative for suicidal ideas and hallucinations. The patient is nervous/anxious.        Forgetful    Past Medical History  Diagnosis Date  . Anemia   . Arthritis   . CHF (congestive heart failure)   . Depression   . Hypertension   . Chronic kidney disease   . Thyroid disease     Patient  Active Problem List   Diagnosis Date Noted  . Hemoglobin A1c 8.0 or greater 04/09/2015  . Pain in left shoulder 04/09/2015  . Chronic myeloid leukemia 03/20/2015  . Pain in right hip 10/19/2014  . Allergic rhinitis 05/16/2014  . Chronic pulmonary embolism 05/11/2014  . Hypothyroidism 05/11/2014  . UTI (urinary tract infection) 05/11/2014  . Chronic UTI (urinary tract infection) 05/11/2014  . Anticoagulation goal of INR 2 to 3 12/02/2013  . Fall 07/01/2013  . Constipation 05/31/2013  . Closed wedge compression fracture of T7 vertebra 03/08/2013  . CKD (chronic kidney disease), stage II 03/08/2013  . Congestive heart failure 10/29/2012  . Osteoarthritis of right knee 10/29/2012  . Iron deficiency anemia 10/22/2012  . Major depressive disorder, recurrent episode, severe 10/22/2012  . Essential hypertension 10/22/2012  . Other primary cardiomyopathies 10/22/2012  . Atrial fibrillation 10/22/2012  . Shortness of breath 10/22/2012    Allergies  Allergen Reactions  . Amiodarone   . Penicillins   . Amoxicillin   . Celecoxib   . Ciprofloxacin   . Codeine   . Diltiazem   . Lactobacillus [Acidophilus Lactobacillus]   . Metoprolol Succinate     All Beta-blockers  . Metronidazole Hcl   . Other     floricene dye netribudazike    Medications: Patient's Medications  New Prescriptions   No medications on file  Previous Medications   ACETAMINOPHEN (TYLENOL) 325 MG TABLET    Take 325 mg by mouth every 6 (six) hours as needed.     ACETAMINOPHEN (TYLENOL) 500  MG TABLET    Take 1 caplet by mouth twice daily   BETA CAROTENE W/MINERALS (OCUVITE) TABLET    Take 1 tablet by mouth daily.     CALCIUM-VITAMIN D (OSCAL WITH D) 500-200 MG-UNIT PER TABLET    Take 1 tablet by mouth 2 (two) times daily.     CAMPHOR-EUCALYPTUS-MENTHOL (VICKS VAPORUB) 4.7-1.2-2.6 % OINT    Apply topically. Rub on chest as directed  (may keep at bedside)   DOCUSATE SODIUM (COLACE) 100 MG CAPSULE    Take 100 mg by  mouth 2 (two) times daily.     DOXYCYCLINE (VIBRAMYCIN) 100 MG CAPSULE    Take 100 mg by mouth 2 (two) times daily.   DULOXETINE (CYMBALTA) 30 MG CAPSULE    Take 30 mg by mouth daily.     FOLIC ACID (FOLVITE) 1 MG TABLET    Take 1 mg by mouth daily.     FUROSEMIDE (LASIX) 20 MG TABLET    Take 20 mg by mouth daily. ONE DAY 20 NEXT DAY 40 AND ALTERNATE.   HYDROCODONE-ACETAMINOPHEN (NORCO/VICODIN) 5-325 MG PER TABLET    Take 1/2 tablet by mouth at bedtime   LEVOTHYROXINE (SYNTHROID, LEVOTHROID) 50 MCG TABLET    Take 50 mcg by mouth daily before breakfast.   MAGNESIUM OXIDE (MAG-OX) 400 MG TABLET    Take 400 mg by mouth daily.     METOPROLOL SUCCINATE (TOPROL-XL) 25 MG 24 HR TABLET    Take 25 mg by mouth daily. Taking 1/2 tab    MULTIPLE VITAMIN (MULTIVITAMIN) TABLET    Take 1 tablet by mouth daily.     POTASSIUM CHLORIDE (MICRO-K) 10 MEQ CR CAPSULE    Take 10 mEq by mouth daily. Takes 20 meq (2) 10 meq daily    SENNOSIDES-DOCUSATE SODIUM (SENOKOT-S) 8.6-50 MG TABLET    Take by mouth daily.    Modified Medications   No medications on file  Discontinued Medications   No medications on file    Physical Exam: Filed Vitals:   04/13/15 1301  BP: 138/78  Pulse: 80  Temp: 98.1 F (36.7 C)  TempSrc: Tympanic  Resp: 20   There is no weight on file to calculate BMI.  Physical Exam  Constitutional: She is oriented to person, place, and time. She appears well-developed and well-nourished. No distress.  HENT:  Head: Normocephalic and atraumatic.  Eyes: Conjunctivae and EOM are normal. Pupils are equal, round, and reactive to light.  Neck: Normal range of motion. Neck supple. No JVD present. No thyromegaly present.  Cardiovascular: Normal rate.  Frequent extrasystoles are present.  Murmur heard. Pulses:      Dorsalis pedis pulses are 2+ on the right side, and 1+ on the left side.  EM 2/6  Pulmonary/Chest: She has no wheezes. She has no rales. She exhibits no tenderness.  Abdominal: Soft.  Bowel sounds are normal. She exhibits no distension. There is no tenderness. There is no guarding.  Musculoskeletal: She exhibits edema (left knee) and tenderness.       Left shoulder: She exhibits decreased range of motion, tenderness, crepitus and pain.       Legs: Anterior distal femur 2x2cm firm nodule palpated, fixed, hard, non tender. Reduced ROM of the R+L shoulder for over head activities. Left wrist/left shoulder/left knee pain on and off. Crepitus to L shoulder, endorses paint with ROM. LLE trace edeam. Chronic L knee pain.  Lymphadenopathy:    She has no cervical adenopathy.  Neurological: She is alert and oriented to  person, place, and time. She has normal reflexes. No cranial nerve deficit. She exhibits normal muscle tone. Coordination normal.  Skin: Skin is dry. No rash noted. She is not diaphoretic. There is erythema (left knee).  baseline of mild heat of the left knee. Dark pigmented anterior RLE.   Psychiatric: She has a normal mood and affect. Her behavior is normal. Judgment and thought content normal. Cognition and memory are impaired. She exhibits abnormal recent memory.    Labs reviewed: Basic Metabolic Panel:  Recent Labs  06/02/14 10/23/14 04/12/15  NA 138 139 141  K 4.7 4.5 4.4  BUN 25*  --  24*  CREATININE 1.4* 1.1 1.2*    Liver Function Tests:  Recent Labs  04/12/15  AST 20  ALT 11  ALKPHOS 66    CBC:  Recent Labs  03/07/15 1314 03/12/15 03/19/15  WBC 44.6* 35.7 39.4  HGB 12.0 11.2* 12.6  HCT 38.1 34* 38  MCV 95  --   --   PLT 82* 73* 93*    Lab Results  Component Value Date   TSH 2.29 04/12/2015   Lab Results  Component Value Date   HGBA1C 5.9 04/12/2015   Lab Results  Component Value Date   CHOL  10/17/2010    112        ATP III CLASSIFICATION:  <200     mg/dL   Desirable  200-239  mg/dL   Borderline High  >=240    mg/dL   High          HDL 30* 10/17/2010   LDLCALC  10/17/2010    66        Total Cholesterol/HDL:CHD  Risk Coronary Heart Disease Risk Table                     Men   Women  1/2 Average Risk   3.4   3.3  Average Risk       5.0   4.4  2 X Average Risk   9.6   7.1  3 X Average Risk  23.4   11.0        Use the calculated Patient Ratio above and the CHD Risk Table to determine the patient's CHD Risk.        ATP III CLASSIFICATION (LDL):  <100     mg/dL   Optimal  100-129  mg/dL   Near or Above                    Optimal  130-159  mg/dL   Borderline  160-189  mg/dL   High  >190     mg/dL   Very High   TRIG 81 10/17/2010   CHOLHDL 3.7 10/17/2010    Significant Diagnostic Results since last visit: none  Patient Care Team: Pamala Duffel, MD as PCP - General (Cardiology)  Assessment/Plan Problem List Items Addressed This Visit    Major depressive disorder, recurrent episode, severe - Primary (Chronic)    Mood has been stable, continue Cymbalta 20mg  since 02/09/15      Essential hypertension (Chronic)    Controlled, continue Metoprolol 12.5mg  daily      Atrial fibrillation (Chronic)    Heart rate is in control, continue Metoprolol      Congestive heart failure (Chronic)    Compensated clinically, continue Furosemide, 04/12/15 Bun/creat 24/1.18, observe the patient.       CKD (chronic kidney disease), stage II (Chronic)    01/05/14 Bun/creat  39/1.18 04/12/15 Bun/creat 24/1.18      Hypothyroidism (Chronic)    12/18/14 TSH 4.023 04/12/15 TSH 2.286 Continue Levothyroxine 1mcg daily.       Hemoglobin A1c 8.0 or greater (Chronic)    04/12/15 Hgb A1c 5.9          Family/ staff Communication: continue SNF for care needs  Labs/tests ordered: none  [facility] Nazair Fortenberry NP Geriatrics [facility] [address], [place] [address] On Call:  [phone] & follow prompts after 5pm & weekends Office Phone:  [phone] Office Fax:  [phone]

## 2015-04-13 NOTE — Assessment & Plan Note (Signed)
Compensated clinically, continue Furosemide, 04/12/15 Bun/creat 24/1.18, observe the patient.

## 2015-04-13 NOTE — Assessment & Plan Note (Signed)
Mood has been stable, continue Cymbalta 20mg  since 02/09/15

## 2015-04-13 NOTE — Assessment & Plan Note (Signed)
Heart rate is in control, continue Metoprolol  

## 2015-04-13 NOTE — Assessment & Plan Note (Signed)
12/18/14 TSH 4.023 04/12/15 TSH 2.286 Continue Levothyroxine 75mcg daily.

## 2015-04-18 ENCOUNTER — Telehealth: Payer: Self-pay | Admitting: Hematology & Oncology

## 2015-04-18 ENCOUNTER — Ambulatory Visit (HOSPITAL_BASED_OUTPATIENT_CLINIC_OR_DEPARTMENT_OTHER): Payer: Medicare Other | Admitting: Hematology & Oncology

## 2015-04-18 ENCOUNTER — Encounter: Payer: Self-pay | Admitting: Hematology & Oncology

## 2015-04-18 ENCOUNTER — Other Ambulatory Visit (HOSPITAL_BASED_OUTPATIENT_CLINIC_OR_DEPARTMENT_OTHER): Payer: Medicare Other

## 2015-04-18 VITALS — BP 116/50 | HR 67 | Temp 97.6°F | Resp 16 | Ht 64.0 in | Wt 192.0 lb

## 2015-04-18 DIAGNOSIS — C9130 Prolymphocytic leukemia of B-cell type not having achieved remission: Secondary | ICD-10-CM

## 2015-04-18 DIAGNOSIS — C9200 Acute myeloblastic leukemia, not having achieved remission: Secondary | ICD-10-CM | POA: Diagnosis not present

## 2015-04-18 DIAGNOSIS — C9110 Chronic lymphocytic leukemia of B-cell type not having achieved remission: Secondary | ICD-10-CM

## 2015-04-18 LAB — MANUAL DIFFERENTIAL (CHCC SATELLITE)
ALC: 14.5 10*3/uL — ABNORMAL HIGH (ref 0.6–2.2)
ANC (CHCC HP manual diff): 4.7 10*3/uL (ref 1.5–6.7)
Blasts: 56 % — ABNORMAL HIGH (ref 0–0)
EOS: 1 % (ref 0–7)
LYMPH: 31 % (ref 14–48)
MONO: 2 % (ref 0–13)
PLT EST ~~LOC~~: DECREASED
Platelet Morphology: NORMAL
SEG: 10 % — ABNORMAL LOW (ref 40–75)

## 2015-04-18 LAB — CBC WITH DIFFERENTIAL (CANCER CENTER ONLY)
HCT: 38.8 % (ref 34.8–46.6)
HGB: 12.1 g/dL (ref 11.6–15.9)
MCH: 29.4 pg (ref 26.0–34.0)
MCHC: 31.2 g/dL — AB (ref 32.0–36.0)
MCV: 94 fL (ref 81–101)
PLATELETS: 88 10*3/uL — AB (ref 145–400)
RBC: 4.11 10*6/uL (ref 3.70–5.32)
RDW: 15.9 % — ABNORMAL HIGH (ref 11.1–15.7)
WBC: 46.8 10*3/uL — ABNORMAL HIGH (ref 3.9–10.0)

## 2015-04-18 LAB — CHCC SATELLITE - SMEAR

## 2015-04-18 NOTE — Progress Notes (Signed)
Hematology and Oncology Follow Up Visit  [name] [id number] 12-24-1914 81 y.o. 04/18/2015   Principle Diagnosis:   B-cell prolymphocytic leukemia  Current Therapy:    Observation     Interim History:  [name] is back for follow-up. We saw her back in August. At that point time, her white cell count gone up quite a bit. We're told that she had acute leukemia.  We went ahead and did flow cytometry on the peripheral blood. The flow cytometry report shows that she has a B-cell monoclonal population that is CD20 positive. It is consistent with a prolymphocytic leukemia.  She feels well. She is doing okay. She's at the nursing home. There really is no indication for therapy.  She's had no cough. She's had no rashes.  She is now off Coumadin. We got her off Coumadin because of her thrombocytopenia.  Her appetite is okay. She's had no nausea or vomiting. Patient has a chronic leg swelling which appears to be a little bit better.  Overall, her performance status is ECOG 3-4.  Medications:  Current outpatient prescriptions:  .  acetaminophen (TYLENOL) 325 MG tablet, Take 325 mg by mouth every 6 (six) hours as needed.  , Disp: , Rfl:  .  acetaminophen (TYLENOL) 500 MG tablet, Take 1 caplet by mouth twice daily, Disp: , Rfl:  .  beta carotene w/minerals (OCUVITE) tablet, Take 1 tablet by mouth daily.  , Disp: , Rfl:  .  calcium-vitamin D (OSCAL WITH D) 500-200 MG-UNIT per tablet, Take 1 tablet by mouth 2 (two) times daily.  , Disp: , Rfl:  .  Camphor-Eucalyptus-Menthol (VICKS VAPORUB) 4.7-1.2-2.6 % OINT, Apply topically. Rub on chest as directed  (may keep at bedside), Disp: , Rfl:  .  docusate sodium (COLACE) 100 MG capsule, Take 100 mg by mouth 2 (two) times daily.  , Disp: , Rfl:  .  doxycycline (VIBRAMYCIN) 100 MG capsule, Take 100 mg by mouth 2 (two) times daily., Disp: , Rfl:  .  DULoxetine (CYMBALTA) 20 MG capsule, , Disp: , Rfl:  .  folic acid (FOLVITE) 1 MG tablet,  Take 1 mg by mouth daily.  , Disp: , Rfl:  .  furosemide (LASIX) 40 MG tablet, , Disp: , Rfl:  .  HYDROcodone-acetaminophen (NORCO/VICODIN) 5-325 MG per tablet, Take 1/2 tablet by mouth at bedtime, Disp: , Rfl:  .  levothyroxine (SYNTHROID, LEVOTHROID) 75 MCG tablet, , Disp: , Rfl:  .  magnesium oxide (MAG-OX) 400 MG tablet, Take 400 mg by mouth daily.  , Disp: , Rfl:  .  metoprolol succinate (TOPROL-XL) 25 MG 24 hr tablet, Take 25 mg by mouth daily. Taking 1/2 tab , Disp: , Rfl:  .  Multiple Vitamin (MULTIVITAMIN) tablet, Take 1 tablet by mouth daily.  , Disp: , Rfl:  .  potassium chloride (MICRO-K) 10 MEQ CR capsule, Take 10 mEq by mouth daily. Takes 20 meq (2) 10 meq daily , Disp: , Rfl:  .  sennosides-docusate sodium (SENOKOT-S) 8.6-50 MG tablet, Take by mouth daily.  , Disp: , Rfl:   Allergies:  Allergies  Allergen Reactions  . Amiodarone   . Penicillins   . Amoxicillin   . Celecoxib   . Ciprofloxacin   . Codeine   . Diltiazem   . Lactobacillus [Acidophilus Lactobacillus]   . Metoprolol Succinate     All Beta-blockers  . Metronidazole Hcl   . Other     floricene dye netribudazike    Past Medical History, Surgical  history, Social history, and Family History were reviewed and updated.  Review of Systems: As above  Physical Exam:  height is 5\' 4"  (1.626 m) and weight is 192 lb (87.091 kg). Her oral temperature is 97.6 F (36.4 C). Her blood pressure is 116/50 and her pulse is 67. Her respiration is 16.   Wt Readings from Last 3 Encounters:  04/18/15 192 lb (87.091 kg)  04/09/15 190 lb (86.183 kg)  10/19/14 193 lb 3.2 oz (87.635 kg)     Elderly white female in no obvious distress. Head and neck exam shows no ocular or oral lesions. She has no palpable cervical or supraclavicular lymph nodes. Lungs are clear. Cardiac exam regular rate and rhythm with an occasional extra beat. She has 1/6 systolic ejection murmur. Abdomen is soft. She is moderately obese. She has no fluid  wave. There is no palpable liver or spleen tip. Back exam shows some slight kyphosis. Extremities shows some chronic 1+ nonpitting edema of the lower legs. Skin exam shows some scattered ecchymoses. Neurological exam shows no focal neurological deficits.  Lab Results  Component Value Date   WBC 46.8* 04/18/2015   HGB 12.1 04/18/2015   HCT 38.8 04/18/2015   MCV 94 04/18/2015   PLT 88* 04/18/2015     Chemistry      Component Value Date/Time   NA 141 04/12/2015   NA 135 03/25/2011 1003   NA 135 03/09/2011 0500   K 4.4 04/12/2015   K 4.7 03/25/2011 1003   CL 97* 03/25/2011 1003   CL 103 03/09/2011 0500   CO2 29 03/25/2011 1003   CO2 27 03/09/2011 0500   BUN 24* 04/12/2015   BUN 10 03/25/2011 1003   BUN 12 03/09/2011 0500   CREATININE 1.2* 04/12/2015   CREATININE 0.8 03/25/2011 1003   CREATININE 0.61 03/09/2011 0500   GLU 91 04/12/2015      Component Value Date/Time   CALCIUM 8.9 03/25/2011 1003   CALCIUM 8.0* 03/09/2011 0500   ALKPHOS 66 04/12/2015   ALKPHOS 54 03/25/2011 1003   AST 20 04/12/2015   AST 20 03/25/2011 1003   ALT 11 04/12/2015   ALT 8* 03/25/2011 1003   BILITOT 0.60 03/25/2011 1003   BILITOT 0.3 02/24/2011 1230         Impression and Plan: [name] is 81 year old female. It certainly looks like she has prolymphocytic leukemia. I did look at her blood smear. Her white blood cells appeared as large lymphocytes. She has nucleoli within then nucleus. There is no granulations in the cytoplasm. I see no Auer rods.  Again, I think that she has a chronic B-cell leukemia. This is a low-grade type of process.  I'll see no indication for therapy for her. She is 81 years old. She is asymptomatic. She has a poor performance status.  I will plan to get her back now in 2-3 months. It can be difficult for her to get to the office so I don't want to have her come out too often.  Her daughter comes with her so she understands very well what is going  on.   Volanda Napoleon, MD 9/21/20162:56 PM

## 2015-04-18 NOTE — Telephone Encounter (Signed)
Called patient's home #. L/m stating patient's upcoming appt for December 2016.       [facility].

## 2015-04-19 LAB — CBC AND DIFFERENTIAL
HEMATOCRIT: 33 % — AB (ref 36–46)
HEMOGLOBIN: 11.1 g/dL — AB (ref 12.0–16.0)
Platelets: 80 10*3/uL — AB (ref 150–399)
WBC: 37.9 10*3/mL

## 2015-04-20 ENCOUNTER — Other Ambulatory Visit: Payer: Self-pay | Admitting: Nurse Practitioner

## 2015-04-20 DIAGNOSIS — C9130 Prolymphocytic leukemia of B-cell type not having achieved remission: Secondary | ICD-10-CM

## 2015-05-11 ENCOUNTER — Non-Acute Institutional Stay (SKILLED_NURSING_FACILITY): Payer: Medicare Other | Admitting: Nurse Practitioner

## 2015-05-11 ENCOUNTER — Encounter: Payer: Self-pay | Admitting: Nurse Practitioner

## 2015-05-11 DIAGNOSIS — C9132 Prolymphocytic leukemia of B-cell type, in relapse: Secondary | ICD-10-CM | POA: Diagnosis not present

## 2015-05-11 DIAGNOSIS — E039 Hypothyroidism, unspecified: Secondary | ICD-10-CM

## 2015-05-11 DIAGNOSIS — M25512 Pain in left shoulder: Secondary | ICD-10-CM | POA: Diagnosis not present

## 2015-05-11 DIAGNOSIS — F333 Major depressive disorder, recurrent, severe with psychotic symptoms: Secondary | ICD-10-CM

## 2015-05-11 DIAGNOSIS — I4821 Permanent atrial fibrillation: Secondary | ICD-10-CM

## 2015-05-11 DIAGNOSIS — I5040 Unspecified combined systolic (congestive) and diastolic (congestive) heart failure: Secondary | ICD-10-CM

## 2015-05-11 DIAGNOSIS — I482 Chronic atrial fibrillation: Secondary | ICD-10-CM

## 2015-05-11 DIAGNOSIS — D509 Iron deficiency anemia, unspecified: Secondary | ICD-10-CM | POA: Diagnosis not present

## 2015-05-11 DIAGNOSIS — I10 Essential (primary) hypertension: Secondary | ICD-10-CM | POA: Diagnosis not present

## 2015-05-11 NOTE — Assessment & Plan Note (Signed)
03/08/15 Hematology: IV iron, 03/19/15 wbc 39.4, cbc wkly, stopped coumadin

## 2015-05-11 NOTE — Assessment & Plan Note (Signed)
Controlled, continue Metoprolol 12.5mg daily.    

## 2015-05-11 NOTE — Assessment & Plan Note (Signed)
12/18/14 TSH 4.023 04/12/15 TSH 2.286 Continue Levothyroxine 56mcg daily.

## 2015-05-11 NOTE — Progress Notes (Signed)
Patient ID: [name], female   DOB: [birth year], 81 y.o.   MRN: [id number]  Location:  SNF [facility] Provider:  Marlana Latus NP  Code Status:  DNR Goals of care: Advanced Directive information    Chief Complaint  Patient presents with  . Medical Management of Chronic Issues  . Acute Visit    left shoulder pain     HPI: Patient is a 81 y.o. female seen in the SNF at [facility] today for evaluation of chronic medical conditions. Worsened left shoulder pain on chronic issue, X-ray L shoulder 05/10/15 showed no acute fx or dislocation, chronic rotate cuff pathology, has been on 1/2 Norco hs and prn, pain assessment initiated to guide pain management.  Hx Afib, HTN, blood sugar(HgbA1c 5.9 04/12/15) depression, dementia, multiple arthritic pain in shoulders and knees, hypothyroidism(TSH 2.286 04/12/15). 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 She is declining gradually. Continue to reside in SNF for care needs.   Review of Systems:  Review of Systems  Constitutional: Negative for fever and diaphoresis.  HENT: Positive for hearing loss. Negative for congestion, ear pain and nosebleeds.   Eyes: Negative for pain, discharge and redness.  Respiratory: Negative for cough, shortness of breath and wheezing.   Cardiovascular: Positive for leg swelling. Negative for chest pain and palpitations.       Trace  Gastrointestinal: Negative for nausea, vomiting, diarrhea and constipation.  Genitourinary: Positive for frequency. Negative for dysuria, urgency, hematuria and flank pain.  Musculoskeletal: Positive for joint pain. Negative for myalgias, back pain and neck pain.       Shoulder pain L>R  Skin: Negative for rash.       Chronic venous insufficiency BLE  Neurological: Negative for dizziness, tremors, seizures, weakness and headaches.  Endo/Heme/Allergies: Bruises/bleeds easily.  Psychiatric/Behavioral: Positive for memory loss. Negative for suicidal ideas and  hallucinations. The patient is nervous/anxious.        Forgetful    Past Medical History  Diagnosis Date  . Anemia   . Arthritis   . CHF (congestive heart failure) ([place])   . Depression   . Hypertension   . Chronic kidney disease   . Thyroid disease     Patient Active Problem List   Diagnosis Date Noted  . Hemoglobin A1c 8.0 or greater 04/09/2015  . Pain in left shoulder 04/09/2015  . 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 Pain in right hip 10/19/2014  . Allergic rhinitis 05/16/2014  . Chronic pulmonary embolism ([place]) 05/11/2014  . Hypothyroidism 05/11/2014  . UTI (urinary tract infection) 05/11/2014  . Chronic UTI (urinary tract infection) 05/11/2014  . Anticoagulation goal of INR 2 to 3 12/02/2013  . Fall 07/01/2013  . Constipation 05/31/2013  . Closed wedge compression fracture of T7 vertebra 03/08/2013  . CKD (chronic kidney disease), stage II 03/08/2013  . Congestive heart failure ([place]) 10/29/2012  . Osteoarthritis of right knee 10/29/2012  . Iron deficiency anemia 10/22/2012  . Major depressive disorder, recurrent episode, severe ([place]) 10/22/2012  . Essential hypertension 10/22/2012  . Other primary cardiomyopathies 10/22/2012  . Atrial fibrillation ([place]) 10/22/2012  . Shortness of breath 10/22/2012    Allergies  Allergen Reactions  . Amiodarone   . Penicillins   . Amoxicillin   . Celecoxib   . Ciprofloxacin   . Codeine   . Diltiazem   . Lactobacillus [Acidophilus Lactobacillus]   . Metoprolol Succinate     All Beta-blockers  . Metronidazole Hcl   . Other  floricene dye netribudazike    Medications: Patient's Medications  New Prescriptions   No medications on file  Previous Medications   ACETAMINOPHEN (TYLENOL) 325 MG TABLET    Take 325 mg by mouth every 6 (six) hours as needed.     ACETAMINOPHEN (TYLENOL) 500 MG TABLET    Take 1 caplet by mouth twice daily   BETA CAROTENE W/MINERALS (OCUVITE) TABLET    Take 1 tablet by mouth  daily.     CALCIUM-VITAMIN D (OSCAL WITH D) 500-200 MG-UNIT PER TABLET    Take 1 tablet by mouth 2 (two) times daily.     CAMPHOR-EUCALYPTUS-MENTHOL (VICKS VAPORUB) 4.7-1.2-2.6 % OINT    Apply topically. Rub on chest as directed  (may keep at bedside)   DOCUSATE SODIUM (COLACE) 100 MG CAPSULE    Take 100 mg by mouth 2 (two) times daily.     DOXYCYCLINE (VIBRAMYCIN) 100 MG CAPSULE    Take 100 mg by mouth 2 (two) times daily.   DULOXETINE (CYMBALTA) 20 MG CAPSULE       FOLIC ACID (FOLVITE) 1 MG TABLET    Take 1 mg by mouth daily.     FUROSEMIDE (LASIX) 40 MG TABLET       HYDROCODONE-ACETAMINOPHEN (NORCO/VICODIN) 5-325 MG PER TABLET    Take 1/2 tablet by mouth at bedtime   LEVOTHYROXINE (SYNTHROID, LEVOTHROID) 75 MCG TABLET       MAGNESIUM OXIDE (MAG-OX) 400 MG TABLET    Take 400 mg by mouth daily.     METOPROLOL SUCCINATE (TOPROL-XL) 25 MG 24 HR TABLET    Take 25 mg by mouth daily. Taking 1/2 tab    MULTIPLE VITAMIN (MULTIVITAMIN) TABLET    Take 1 tablet by mouth daily.     POTASSIUM CHLORIDE (MICRO-K) 10 MEQ CR CAPSULE    Take 10 mEq by mouth daily. Takes 20 meq (2) 10 meq daily    SENNOSIDES-DOCUSATE SODIUM (SENOKOT-S) 8.6-50 MG TABLET    Take by mouth daily.    Modified Medications   No medications on file  Discontinued Medications   No medications on file    Physical Exam: Filed Vitals:   05/11/15 1328  BP: 110/55  Pulse: 69  Temp: 97.9 F (36.6 C)  TempSrc: Tympanic  Resp: 20   There is no weight on file to calculate BMI.  Physical Exam  Constitutional: She is oriented to person, place, and time. She appears well-developed and well-nourished. No distress.  HENT:  Head: Normocephalic and atraumatic.  Eyes: Conjunctivae and EOM are normal. Pupils are equal, round, and reactive to light.  Neck: Normal range of motion. Neck supple. No JVD present. No thyromegaly present.  Cardiovascular: Normal rate.  Frequent extrasystoles are present.  Murmur heard. Pulses:      Dorsalis  pedis pulses are 2+ on the right side, and 1+ on the left side.  EM 2/6  Pulmonary/Chest: She has no wheezes. She has no rales. She exhibits no tenderness.  Abdominal: Soft. Bowel sounds are normal. She exhibits no distension. There is no tenderness. There is no guarding.  Musculoskeletal: She exhibits edema (left knee) and tenderness.       Left shoulder: She exhibits decreased range of motion, tenderness, crepitus and pain.       Legs: Anterior distal femur 2x2cm firm nodule palpated, fixed, hard, non tender. Reduced ROM of the R+L shoulder for over head activities. Left wrist/left shoulder/left knee pain on and off. Crepitus to L shoulder, endorses paint with ROM. LLE trace edeam.  Chronic L knee pain.  Lymphadenopathy:    She has no cervical adenopathy.  Neurological: She is alert and oriented to person, place, and time. She has normal reflexes. No cranial nerve deficit. She exhibits normal muscle tone. Coordination normal.  Skin: Skin is dry. No rash noted. She is not diaphoretic. There is erythema (left knee).  baseline of mild heat of the left knee. Dark pigmented anterior RLE.   Psychiatric: She has a normal mood and affect. Her behavior is normal. Judgment and thought content normal. Cognition and memory are impaired. She exhibits abnormal recent memory.    Labs reviewed: Basic Metabolic Panel:  Recent Labs  06/02/14 10/23/14 04/12/15  NA 138 139 141  K 4.7 4.5 4.4  BUN 25*  --  24*  CREATININE 1.4* 1.1 1.2*    Liver Function Tests:  Recent Labs  04/12/15  AST 20  ALT 11  ALKPHOS 66    CBC:  Recent Labs  03/07/15 1314  03/19/15 04/18/15 1335 04/19/15  WBC 44.6*  < > 39.4 46.8* 37.9  HGB 12.0  < > 12.6 12.1 11.1*  HCT 38.1  < > 38 38.8 33*  MCV 95  --   --  94  --   PLT 82*  < > 93* 88* 80*  < > = values in this interval not displayed.  Lab Results  Component Value Date   TSH 2.29 04/12/2015   Lab Results  Component Value Date   HGBA1C 5.9 04/12/2015    Lab Results  Component Value Date   CHOL  10/17/2010    112        ATP III CLASSIFICATION:  <200     mg/dL   Desirable  200-239  mg/dL   Borderline High  >=240    mg/dL   High          HDL 30* 10/17/2010   LDLCALC  10/17/2010    66        Total Cholesterol/HDL:CHD Risk Coronary Heart Disease Risk Table                     Men   Women  1/2 Average Risk   3.4   3.3  Average Risk       5.0   4.4  2 X Average Risk   9.6   7.1  3 X Average Risk  23.4   11.0        Use the calculated Patient Ratio above and the CHD Risk Table to determine the patient's CHD Risk.        ATP III CLASSIFICATION (LDL):  <100     mg/dL   Optimal  100-129  mg/dL   Near or Above                    Optimal  130-159  mg/dL   Borderline  160-189  mg/dL   High  >190     mg/dL   Very High   TRIG 81 10/17/2010   CHOLHDL 3.7 10/17/2010    Significant Diagnostic Results since last visit: none  Patient Care Team: Pamala Duffel, MD as PCP - General (Cardiology)  Assessment/Plan Problem List Items Addressed This Visit    Major depressive disorder, recurrent episode, severe ([place]) (Chronic)    Mood has been stable, continue Cymbalta 20mg  since 02/09/15      Essential hypertension (Chronic)    Controlled, continue Metoprolol 12.5mg  daily      Atrial  fibrillation (HCC) (Chronic)    Heart rate is in control, continue Metoprolol      Congestive heart failure ([place]) (Chronic)    Compensated clinically, continue Furosemide, 04/12/15 Bun/creat 24/1.18, observe the patient.       Hypothyroidism (Chronic)    12/18/14 TSH 4.023 04/12/15 TSH 2.286 Continue Levothyroxine 69mcg daily.       Pain in left shoulder - Primary (Chronic)    Worsened left shoulder pain on chronic issue, X-ray L shoulder 05/10/15 showed no acute fx or dislocation, chronic rotate cuff pathology, has been on 1/2 Norco hs and prn, pain assessment initiated to guide pain management.       Iron deficiency anemia    Stable, Hgb has  been 11s.      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, [place] [address] On Call:  [phone] & follow prompts after 5pm & weekends Office Phone:  [phone] Office Fax:  [phone]

## 2015-05-11 NOTE — Assessment & Plan Note (Signed)
Stable, Hgb has been 11s.

## 2015-05-11 NOTE — Assessment & Plan Note (Signed)
Mood has been stable, continue Cymbalta 20mg  since 02/09/15

## 2015-05-11 NOTE — Assessment & Plan Note (Signed)
Heart rate is in control, continue Metoprolol  

## 2015-05-11 NOTE — Assessment & Plan Note (Signed)
Compensated clinically, continue Furosemide, 04/12/15 Bun/creat 24/1.18, observe the patient.

## 2015-05-11 NOTE — Assessment & Plan Note (Signed)
Worsened left shoulder pain on chronic issue, X-ray L shoulder 05/10/15 showed no acute fx or dislocation, chronic rotate cuff pathology, has been on 1/2 Norco hs and prn, pain assessment initiated to guide pain management.

## 2015-05-24 LAB — CBC AND DIFFERENTIAL
HEMATOCRIT: 33 % — AB (ref 36–46)
HEMOGLOBIN: 11 g/dL — AB (ref 12.0–16.0)
Platelets: 96 10*3/uL — AB (ref 150–399)
WBC: 45.5 10*3/mL

## 2015-05-25 ENCOUNTER — Other Ambulatory Visit: Payer: Self-pay | Admitting: Nurse Practitioner

## 2015-05-25 DIAGNOSIS — C9132 Prolymphocytic leukemia of B-cell type, in relapse: Secondary | ICD-10-CM

## 2015-05-29 ENCOUNTER — Non-Acute Institutional Stay (SKILLED_NURSING_FACILITY): Payer: Medicare Other | Admitting: Nurse Practitioner

## 2015-05-29 ENCOUNTER — Encounter: Payer: Self-pay | Admitting: Nurse Practitioner

## 2015-05-29 DIAGNOSIS — C9130 Prolymphocytic leukemia of B-cell type not having achieved remission: Secondary | ICD-10-CM

## 2015-05-29 DIAGNOSIS — E039 Hypothyroidism, unspecified: Secondary | ICD-10-CM

## 2015-05-29 DIAGNOSIS — M25512 Pain in left shoulder: Secondary | ICD-10-CM

## 2015-05-29 DIAGNOSIS — M1711 Unilateral primary osteoarthritis, right knee: Secondary | ICD-10-CM | POA: Diagnosis not present

## 2015-05-29 DIAGNOSIS — F332 Major depressive disorder, recurrent severe without psychotic features: Secondary | ICD-10-CM | POA: Diagnosis not present

## 2015-05-29 DIAGNOSIS — I5040 Unspecified combined systolic (congestive) and diastolic (congestive) heart failure: Secondary | ICD-10-CM | POA: Diagnosis not present

## 2015-05-29 DIAGNOSIS — I10 Essential (primary) hypertension: Secondary | ICD-10-CM | POA: Diagnosis not present

## 2015-05-29 DIAGNOSIS — I480 Paroxysmal atrial fibrillation: Secondary | ICD-10-CM

## 2015-05-29 NOTE — Assessment & Plan Note (Signed)
Hx of infected joint replacement, now s/p fusion surgery, continue indefinite use of Doxycycline 100mg  bid. No s/s of infection persently.

## 2015-05-29 NOTE — Assessment & Plan Note (Signed)
Better worsened left shoulder pain on chronic issue, X-ray L shoulder 05/10/15 showed no acute fx or dislocation, chronic rotate cuff pathology, conbtinue 1/2 Norco hs and prn

## 2015-05-29 NOTE — Assessment & Plan Note (Signed)
Controlled, continue Metoprolol 12.5mg daily.    

## 2015-05-29 NOTE — Assessment & Plan Note (Signed)
Mood has been stable, continue Cymbalta 20mg  since 02/09/15

## 2015-05-29 NOTE — Progress Notes (Signed)
Patient ID: [name], female   DOB: 03/07/1915, 81 y.o.   MRN: [id number]  Location:  SNF [facility] Provider:  Marlana Latus NP  Code Status:  DNR Goals of care: Advanced Directive information    Chief Complaint  Patient presents with  . Medical Management of Chronic Issues  . Acute Visit    requested by the patient's dtr for c/o "eye problem"     HPI: Patient is a 81 y.o. female seen in the SNF at [facility] today for evaluation of chronic medical conditions. The patient's dtr requested to see the patient for eye problem, the patient denied vision change, eye pain or pressure, no noted redness or drainage upon my examination . Chronic left shoulder pain is better managed with Norco. Hx Afib, HTN, blood sugar(HgbA1c 5.9 04/12/15) depression, dementia, multiple arthritic pain in shoulders and knees, hypothyroidism(TSH 2.286 04/12/15). 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 She is declining gradually. Continue to reside in SNF for care needs.   Review of Systems:  Review of Systems  Constitutional: Negative for fever and diaphoresis.  HENT: Positive for hearing loss. Negative for congestion, ear pain and nosebleeds.   Eyes: Negative for pain, discharge and redness.  Respiratory: Negative for cough, shortness of breath and wheezing.   Cardiovascular: Positive for leg swelling. Negative for chest pain and palpitations.       Trace  Gastrointestinal: Negative for nausea, vomiting, diarrhea and constipation.  Genitourinary: Positive for frequency. Negative for dysuria, urgency, hematuria and flank pain.  Musculoskeletal: Positive for joint pain. Negative for myalgias, back pain and neck pain.       Shoulder pain L>R  Skin: Negative for rash.       Chronic venous insufficiency BLE  Neurological: Negative for dizziness, tremors, seizures, weakness and headaches.  Endo/Heme/Allergies: Does not bruise/bleed easily.  Psychiatric/Behavioral: Positive for memory  loss. Negative for suicidal ideas and hallucinations. The patient is nervous/anxious.        Forgetful    Past Medical History  Diagnosis Date  . Anemia   . Arthritis   . CHF (congestive heart failure) ([place])   . Depression   . Hypertension   . Chronic kidney disease   . Thyroid disease     Patient Active Problem List   Diagnosis Date Noted  . Hemoglobin A1c 8.0 or greater 04/09/2015  . Pain in left shoulder 04/09/2015  . 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 Pain in right hip 10/19/2014  . Allergic rhinitis 05/16/2014  . Chronic pulmonary embolism ([place]) 05/11/2014  . Hypothyroidism 05/11/2014  . UTI (urinary tract infection) 05/11/2014  . Chronic UTI (urinary tract infection) 05/11/2014  . Anticoagulation goal of INR 2 to 3 12/02/2013  . Fall 07/01/2013  . Constipation 05/31/2013  . Closed wedge compression fracture of T7 vertebra 03/08/2013  . CKD (chronic kidney disease), stage II 03/08/2013  . Congestive heart failure ([place]) 10/29/2012  . Osteoarthritis of right knee 10/29/2012  . Iron deficiency anemia 10/22/2012  . Major depressive disorder, recurrent episode, severe ([place]) 10/22/2012  . Essential hypertension 10/22/2012  . Other primary cardiomyopathies 10/22/2012  . Atrial fibrillation ([place]) 10/22/2012  . Shortness of breath 10/22/2012    Allergies  Allergen Reactions  . Amiodarone   . Penicillins   . Amoxicillin   . Celecoxib   . Ciprofloxacin   . Codeine   . Diltiazem   . Lactobacillus [Acidophilus Lactobacillus]   . Metoprolol Succinate     All  Beta-blockers  . Metronidazole Hcl   . Other     floricene dye netribudazike    Medications: Patient's Medications  New Prescriptions   No medications on file  Previous Medications   ACETAMINOPHEN (TYLENOL) 325 MG TABLET    Take 325 mg by mouth every 6 (six) hours as needed.     ACETAMINOPHEN (TYLENOL) 500 MG TABLET    Take 1 caplet by mouth twice daily   BETA CAROTENE W/MINERALS (OCUVITE)  TABLET    Take 1 tablet by mouth daily.     CALCIUM-VITAMIN D (OSCAL WITH D) 500-200 MG-UNIT PER TABLET    Take 1 tablet by mouth 2 (two) times daily.     CAMPHOR-EUCALYPTUS-MENTHOL (VICKS VAPORUB) 4.7-1.2-2.6 % OINT    Apply topically. Rub on chest as directed  (may keep at bedside)   DOCUSATE SODIUM (COLACE) 100 MG CAPSULE    Take 100 mg by mouth 2 (two) times daily.     DOXYCYCLINE (VIBRAMYCIN) 100 MG CAPSULE    Take 100 mg by mouth 2 (two) times daily.   DULOXETINE (CYMBALTA) 20 MG CAPSULE       FOLIC ACID (FOLVITE) 1 MG TABLET    Take 1 mg by mouth daily.     FUROSEMIDE (LASIX) 40 MG TABLET       HYDROCODONE-ACETAMINOPHEN (NORCO/VICODIN) 5-325 MG PER TABLET    Take 1/2 tablet by mouth at bedtime   LEVOTHYROXINE (SYNTHROID, LEVOTHROID) 75 MCG TABLET       MAGNESIUM OXIDE (MAG-OX) 400 MG TABLET    Take 400 mg by mouth daily.     METOPROLOL SUCCINATE (TOPROL-XL) 25 MG 24 HR TABLET    Take 25 mg by mouth daily. Taking 1/2 tab    MULTIPLE VITAMIN (MULTIVITAMIN) TABLET    Take 1 tablet by mouth daily.     POTASSIUM CHLORIDE (MICRO-K) 10 MEQ CR CAPSULE    Take 10 mEq by mouth daily. Takes 20 meq (2) 10 meq daily    SENNOSIDES-DOCUSATE SODIUM (SENOKOT-S) 8.6-50 MG TABLET    Take by mouth daily.    Modified Medications   No medications on file  Discontinued Medications   No medications on file    Physical Exam: Filed Vitals:   05/29/15 1259  BP: 138/78  Pulse: 68  Temp: 98.1 F (36.7 C)  TempSrc: Tympanic  Resp: 16   There is no weight on file to calculate BMI.  Physical Exam  Constitutional: She is oriented to person, place, and time. She appears well-developed and well-nourished. No distress.  HENT:  Head: Normocephalic and atraumatic.  Eyes: Conjunctivae and EOM are normal. Pupils are equal, round, and reactive to light.  Neck: Normal range of motion. Neck supple. No JVD present. No thyromegaly present.  Cardiovascular: Normal rate.  Frequent extrasystoles are present.  Murmur  heard. Pulses:      Dorsalis pedis pulses are 2+ on the right side, and 1+ on the left side.  EM 2/6  Pulmonary/Chest: She has no wheezes. She has no rales. She exhibits no tenderness.  Abdominal: Soft. Bowel sounds are normal. She exhibits no distension. There is no tenderness. There is no guarding.  Musculoskeletal: She exhibits edema (left knee) and tenderness.       Left shoulder: She exhibits decreased range of motion, tenderness, crepitus and pain.       Legs: Anterior distal femur 2x2cm firm nodule palpated, fixed, hard, non tender. Reduced ROM of the R+L shoulder for over head activities. Left wrist/left shoulder/left knee pain on  and off. Crepitus to L shoulder, endorses paint with ROM. LLE trace edeam. Chronic R knee pain.  Lymphadenopathy:    She has no cervical adenopathy.  Neurological: She is alert and oriented to person, place, and time. She has normal reflexes. No cranial nerve deficit. She exhibits normal muscle tone. Coordination normal.  Skin: Skin is dry. No rash noted. She is not diaphoretic. There is erythema (left knee).  baseline of mild heat of the left knee. Dark pigmented anterior RLE.   Psychiatric: She has a normal mood and affect. Her behavior is normal. Judgment and thought content normal. Cognition and memory are impaired. She exhibits abnormal recent memory.    Labs reviewed: Basic Metabolic Panel:  Recent Labs  06/02/14 10/23/14 04/12/15  NA 138 139 141  K 4.7 4.5 4.4  BUN 25*  --  24*  CREATININE 1.4* 1.1 1.2*    Liver Function Tests:  Recent Labs  04/12/15  AST 20  ALT 11  ALKPHOS 66    CBC:  Recent Labs  03/07/15 1314  04/18/15 1335 04/19/15 05/24/15  WBC 44.6*  < > 46.8* 37.9 45.5  HGB 12.0  < > 12.1 11.1* 11.0*  HCT 38.1  < > 38.8 33* 33*  MCV 95  --  94  --   --   PLT 82*  < > 88* 80* 96*  < > = values in this interval not displayed.  Lab Results  Component Value Date   TSH 2.29 04/12/2015   Lab Results  Component Value  Date   HGBA1C 5.9 04/12/2015   Lab Results  Component Value Date   CHOL  10/17/2010    112        ATP III CLASSIFICATION:  <200     mg/dL   Desirable  200-239  mg/dL   Borderline High  >=240    mg/dL   High          HDL 30* 10/17/2010   LDLCALC  10/17/2010    66        Total Cholesterol/HDL:CHD Risk Coronary Heart Disease Risk Table                     Men   Women  1/2 Average Risk   3.4   3.3  Average Risk       5.0   4.4  2 X Average Risk   9.6   7.1  3 X Average Risk  23.4   11.0        Use the calculated Patient Ratio above and the CHD Risk Table to determine the patient's CHD Risk.        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       Essential hypertension (Chronic)    Controlled, continue Metoprolol 12.5mg  daily      Hypothyroidism - Primary (Chronic)    04/12/15 TSH 2.286 Continue Levothyroxine 92mcg daily.       Major depressive disorder, recurrent episode, severe (HCC) (Chronic)    Mood has been stable, continue Cymbalta 20mg  since 02/09/15      Osteoarthritis of right knee    Hx of infected joint replacement, now s/p fusion surgery, continue indefinite use of Doxycycline 100mg  bid. No s/s of infection  persently.       Pain in left shoulder (Chronic)    Better worsened left shoulder pain on chronic issue, X-ray L shoulder 05/10/15 showed no acute fx or dislocation, chronic rotate cuff pathology, conbtinue 1/2 Norco hs and prn          Family/ staff Communication: continue SNF for care needs  Labs/tests ordered: none  [facility] Mast NP Geriatrics [facility] [address], [place] [address] On Call:  [phone] & follow prompts after 5pm & weekends Office Phone:  [phone] Office Fax:  [phone]

## 2015-05-29 NOTE — Assessment & Plan Note (Signed)
Compensated clinically, continue Furosemide 40mg  qd, 04/12/15 Bun/creat 24/1.18, observe the patient.

## 2015-05-29 NOTE — Assessment & Plan Note (Signed)
Heart rate is in control, continue Metoprolol 12.5mg qd.  

## 2015-05-29 NOTE — Assessment & Plan Note (Signed)
04/12/15 TSH 2.286 Continue Levothyroxine 25mcg daily.

## 2015-05-29 NOTE — Assessment & Plan Note (Signed)
03/08/15 Hematology: IV iron, 03/19/15 wbc 39.4, cbc wkly, stopped coumadin

## 2015-06-07 NOTE — Progress Notes (Signed)
This encounter was created in error - please disregard.

## 2015-06-25 LAB — CBC AND DIFFERENTIAL
HCT: 36 % (ref 36–46)
Hemoglobin: 11.8 g/dL — AB (ref 12.0–16.0)
Platelets: 99 10*3/uL — AB (ref 150–399)
WBC: 42 10^3/mL

## 2015-06-26 ENCOUNTER — Other Ambulatory Visit: Payer: Self-pay | Admitting: Nurse Practitioner

## 2015-06-26 DIAGNOSIS — C9130 Prolymphocytic leukemia of B-cell type not having achieved remission: Secondary | ICD-10-CM

## 2015-06-26 DIAGNOSIS — D509 Iron deficiency anemia, unspecified: Secondary | ICD-10-CM

## 2015-07-02 ENCOUNTER — Non-Acute Institutional Stay (SKILLED_NURSING_FACILITY): Payer: Medicare Other | Admitting: Internal Medicine

## 2015-07-02 ENCOUNTER — Encounter: Payer: Self-pay | Admitting: Internal Medicine

## 2015-07-02 DIAGNOSIS — I10 Essential (primary) hypertension: Secondary | ICD-10-CM | POA: Diagnosis not present

## 2015-07-02 DIAGNOSIS — F332 Major depressive disorder, recurrent severe without psychotic features: Secondary | ICD-10-CM | POA: Diagnosis not present

## 2015-07-02 DIAGNOSIS — E039 Hypothyroidism, unspecified: Secondary | ICD-10-CM

## 2015-07-02 DIAGNOSIS — I5040 Unspecified combined systolic (congestive) and diastolic (congestive) heart failure: Secondary | ICD-10-CM | POA: Diagnosis not present

## 2015-07-02 DIAGNOSIS — M25512 Pain in left shoulder: Secondary | ICD-10-CM

## 2015-07-02 DIAGNOSIS — D696 Thrombocytopenia, unspecified: Secondary | ICD-10-CM

## 2015-07-02 DIAGNOSIS — C9130 Prolymphocytic leukemia of B-cell type not having achieved remission: Secondary | ICD-10-CM

## 2015-07-02 NOTE — Progress Notes (Signed)
Patient ID: [name], female   DOB: 10-Mar-1915, 81 y.o.   MRN: [id number]    [place] Room Number: N 11  Place of Service: SNF (31)     Allergies  Allergen Reactions  . Amiodarone   . Penicillins   . Amoxicillin   . Celecoxib   . Ciprofloxacin   . Codeine   . Diltiazem   . Lactobacillus [Acidophilus Lactobacillus]   . Metoprolol Succinate     All Beta-blockers  . Metronidazole Hcl   . Other     floricene dye netribudazike    Chief Complaint  Patient presents with  . 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 Burney Gauze for beta cell monoclonal prolymphocytic leukemia. Dr. Marin Olp believes this is a chronic indolent problem which does not need treatment at present time.  Combined systolic and diastolic congestive heart failure, unspecified congestive heart failure chronicity ([place]) - compensated. Without significant cough, dyspnea, or palpitations.  Essential hypertension - controlled  Hypothyroidism, unspecified hypothyroidism type - compensated  Severe episode of recurrent major depressive disorder, without psychotic features ([place]) - controlled and stable on current medication  Pain in left shoulder - chronic and stable  Thrombocytopenia ([place]) - present as part of her prolymphocytic leukemia. She was removed from Coumadin which had been given for previous pulmonary embolism, because of her thrombocytopenia.    Medications: Patient's Medications  New Prescriptions   No medications on file  Previous Medications   ACETAMINOPHEN (TYLENOL) 325 MG TABLET    Take 325 mg by mouth every 6 (six) hours as needed.     ACETAMINOPHEN (TYLENOL) 500 MG TABLET    Take 1 caplet by mouth twice daily   BETA CAROTENE W/MINERALS (OCUVITE) TABLET    Take 1 tablet by mouth daily.     CALCIUM-VITAMIN D  (OSCAL WITH D) 500-200 MG-UNIT PER TABLET    Take 1 tablet by mouth 2 (two) times daily.     CAMPHOR-EUCALYPTUS-MENTHOL (VICKS VAPORUB) 4.7-1.2-2.6 % OINT    Apply topically. Rub on chest as directed  (may keep at bedside)   DOCUSATE SODIUM (COLACE) 100 MG CAPSULE    Take 100 mg by mouth 2 (two) times daily.     DOXYCYCLINE (VIBRAMYCIN) 100 MG CAPSULE    Take 100 mg by mouth 2 (two) times daily.   DULOXETINE (CYMBALTA) 20 MG CAPSULE       FOLIC ACID (FOLVITE) 1 MG TABLET    Take 1 mg by mouth daily.     FUROSEMIDE (LASIX) 40 MG TABLET       HYDROCODONE-ACETAMINOPHEN (NORCO/VICODIN) 5-325 MG PER TABLET    Take 1/2 tablet by mouth at bedtime   LEVOTHYROXINE (SYNTHROID, LEVOTHROID) 75 MCG TABLET       MAGNESIUM OXIDE (MAG-OX) 400 MG TABLET    Take 400 mg by mouth daily.     METOPROLOL SUCCINATE (TOPROL-XL) 25 MG 24 HR TABLET    Take 25 mg by mouth daily. Taking 1/2 tab    MULTIPLE VITAMIN (MULTIVITAMIN) TABLET    Take 1 tablet by mouth daily.     POTASSIUM CHLORIDE (MICRO-K) 10 MEQ CR CAPSULE    Take 10 mEq by mouth daily. Takes 20 meq (2) 10 meq daily    SENNOSIDES-DOCUSATE SODIUM (SENOKOT-S) 8.6-50 MG TABLET    Take by mouth daily.  Modified Medications   No medications on file  Discontinued Medications   No medications on file     Review of Systems  Constitutional: Negative for fever and diaphoresis.  HENT: Positive for hearing loss. Negative for congestion, ear pain and nosebleeds.   Eyes: Negative for pain, discharge and redness.  Respiratory: Negative for cough, shortness of breath and wheezing.   Cardiovascular: Positive for leg swelling. Negative for chest pain and palpitations.       Combined systolic and diastolic congestive heart failure chronically. Paroxysmal atrial fibrillation  Gastrointestinal: Negative for nausea, vomiting, diarrhea and constipation.  Endocrine:       Compensated hypothyroidism  Genitourinary: Positive for frequency. Negative for dysuria, urgency,  hematuria and flank pain.  Musculoskeletal: Negative for myalgias, back pain and neck pain.       Shoulder pain L>R Osteoarthritis of the right knee  Skin: Negative for rash.       Chronic venous insufficiency BLE  Neurological: Negative for dizziness, tremors, seizures, weakness and headaches.  Hematological: Does not bruise/bleed easily.       Prolymphocytic leukemia of beta cell type  Psychiatric/Behavioral: Negative for suicidal ideas and hallucinations. The patient is nervous/anxious.        Forgetful History of recurrent major depressive disorder    Filed Vitals:   07/02/15 1555  BP: 130/81  Pulse: 69  Temp: 98.2 F (36.8 C)  Resp: 16  Height: 5' 4.5" (1.638 m)  Weight: 181 lb 1.6 oz (82.146 kg)  SpO2: 98%   Body mass index is 30.62 kg/(m^2).  Physical Exam  Constitutional: She is oriented to person, place, and time. She appears well-developed and well-nourished. No distress.  HENT:  Head: Normocephalic and atraumatic.  Eyes: Conjunctivae and EOM are normal. Pupils are equal, round, and reactive to light.  Neck: Normal range of motion. Neck supple. No JVD present. No thyromegaly present.  Cardiovascular: Normal rate.  Frequent extrasystoles are present.  Murmur heard. Pulses:      Dorsalis pedis pulses are 2+ on the right side, and 1+ on the left side.  EM 2/6  Pulmonary/Chest: She has no wheezes. She has no rales. She exhibits no tenderness.  Abdominal: Soft. Bowel sounds are normal. She exhibits no distension. There is no tenderness. There is no guarding.  Musculoskeletal: She exhibits edema (left knee) and tenderness.       Left shoulder: She exhibits decreased range of motion, tenderness, crepitus and pain.       Legs: Anterior distal femur 2x2cm firm nodule palpated, fixed, hard, non tender. Reduced ROM of the R+L shoulder for over head activities. Left wrist/left shoulder/left knee pain on and off. Crepitus to L shoulder, endorses paint with ROM. LLE trace  edeam. Chronic R knee pain.  Lymphadenopathy:    She has no cervical adenopathy.  Neurological: She is alert and oriented to person, place, and time. She has normal reflexes. No cranial nerve deficit. She exhibits normal muscle tone. Coordination normal.  Skin: Skin is dry. No rash noted. She is not diaphoretic. There is erythema (left knee).  baseline of mild heat of the left knee. Dark pigmented anterior RLE.   Psychiatric: She has a normal mood and affect. Her behavior is normal. Judgment and thought content normal. Cognition and memory are impaired. She exhibits abnormal recent memory.     Labs reviewed: Lab Summary Latest Ref Rng 06/25/2015 05/24/2015 04/19/2015 04/18/2015 04/12/2015 03/19/2015 03/12/2015  Hemoglobin 12.0 - 16.0 g/dL 11.8(A) 11.0(A) 11.1(A) 12.1 (None) 12.6 11.2(A)  Hematocrit 36 - 46 % 36 33(A) 33(A) 38.8 (None) 38 34(A)  White count - 42.0 45.5 37.9 46.8(H) (None) 39.4 35.7  Platelet count 150 - 399 K/L 99(A) 96(A) 80(A) 88(L) (None) 93(A) 73(A)  Sodium 137 - 147 mmol/L (None) (None) (None) (None) 141 (None) (None)  Potassium 3.4 - 5.3 mmol/L (None) (None) (None) (None) 4.4 (None) (None)  Calcium - (None) (None) (None) (None) (None) (None) (None)  Phosphorus - (None) (None) (None) (None) (None) (None) (None)  Creatinine 0.5 - 1.1 mg/dL (None) (None) (None) (None) 1.2(A) (None) (None)  AST 13 - 35 U/L (None) (None) (None) (None) 20 (None) (None)  Alk Phos 25 - 125 U/L (None) (None) (None) (None) 66 (None) (None)  Bilirubin - (None) (None) (None) (None) (None) (None) (None)  Glucose - (None) (None) (None) (None) 91 (None) (None)  Cholesterol - (None) (None) (None) (None) (None) (None) (None)  HDL cholesterol - (None) (None) (None) (None) (None) (None) (None)  Triglycerides - (None) (None) (None) (None) (None) (None) (None)  LDL Direct - (None) (None) (None) (None) (None) (None) (None)  LDL Calc - (None) (None) (None) (None) (None) (None) (None)  Total protein - (None)  (None) (None) (None) (None) (None) (None)  Albumin - (None) (None) (None) (None) (None) (None) (None)   Lab Results  Component Value Date   TSH 2.29 04/12/2015   T4TOTAL 7.5 12/26/2010   Lab Results  Component Value Date   BUN 24* 04/12/2015   Lab Results  Component Value Date   HGBA1C 5.9 04/12/2015       Assessment/Plan  1. 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 Hemoglobin was 11.8 and platelets were 90,000. These values are similar to those obtained September 2016.  2. Combined systolic and diastolic congestive heart failure, unspecified congestive heart failure chronicity ([place]) Compensated. Denies dyspnea, chest pain, or palpitations.  3. Essential hypertension Controlled  4. Hypothyroidism, unspecified hypothyroidism type Compensated  5. Severe episode of recurrent major depressive disorder, without psychotic features ([place]) Controlled on current medication  6. Pain in left shoulder Unchanged from prior visits  7. Thrombocytopenia (HCC) Stable at 99,000

## 2015-07-03 ENCOUNTER — Encounter: Payer: Self-pay | Admitting: Nurse Practitioner

## 2015-07-03 ENCOUNTER — Non-Acute Institutional Stay (SKILLED_NURSING_FACILITY): Payer: Medicare Other | Admitting: Nurse Practitioner

## 2015-07-03 DIAGNOSIS — E039 Hypothyroidism, unspecified: Secondary | ICD-10-CM

## 2015-07-03 DIAGNOSIS — I5020 Unspecified systolic (congestive) heart failure: Secondary | ICD-10-CM

## 2015-07-03 DIAGNOSIS — D696 Thrombocytopenia, unspecified: Secondary | ICD-10-CM | POA: Diagnosis not present

## 2015-07-03 DIAGNOSIS — M1711 Unilateral primary osteoarthritis, right knee: Secondary | ICD-10-CM

## 2015-07-03 DIAGNOSIS — C9130 Prolymphocytic leukemia of B-cell type not having achieved remission: Secondary | ICD-10-CM

## 2015-07-03 DIAGNOSIS — F332 Major depressive disorder, recurrent severe without psychotic features: Secondary | ICD-10-CM

## 2015-07-03 DIAGNOSIS — M25512 Pain in left shoulder: Secondary | ICD-10-CM | POA: Diagnosis not present

## 2015-07-03 DIAGNOSIS — I10 Essential (primary) hypertension: Secondary | ICD-10-CM | POA: Diagnosis not present

## 2015-07-03 DIAGNOSIS — I480 Paroxysmal atrial fibrillation: Secondary | ICD-10-CM

## 2015-07-03 NOTE — Assessment & Plan Note (Signed)
Compensated clinically, continue Furosemide 40mg qd, 04/12/15 Bun/creat 24/1.18, observe the patient.  

## 2015-07-03 NOTE — Assessment & Plan Note (Signed)
Mood has been stable, continue Cymbalta 20mg since 02/09/15 

## 2015-07-03 NOTE — Assessment & Plan Note (Signed)
04/12/15 TSH 2.286 Continue Levothyroxine 75mcg daily.  

## 2015-07-03 NOTE — Assessment & Plan Note (Signed)
03/08/15 Hematology: IV iron, 03/19/15 wbc 39.4, cbc wkly, stopped coumadin 06/25/15 wbc 42, Hgb 11.8, plt 99

## 2015-07-03 NOTE — Assessment & Plan Note (Signed)
Controlled, continue Metoprolol 12.5mg daily.    

## 2015-07-03 NOTE — Assessment & Plan Note (Signed)
Heart rate is in control, continue Metoprolol 12.5mg qd.  

## 2015-07-03 NOTE — Progress Notes (Signed)
Patient ID: [name], female   DOB: 1914-11-12, 81 y.o.   MRN: [id number]  Location:  SNF [facility] Provider:  Marlana Latus NP  Code Status:  DNR Goals of care: Advanced Directive information    Chief Complaint  Patient presents with  . Medical Management of Chronic Issues     HPI: Patient is a 81 y.o. female seen in the SNF at [facility] today for evaluation of Chronic left shoulder pain is better managed with Norco. Hx Afib, HTN, blood sugar(HgbA1c 5.9 04/12/15) depression, dementia, multiple arthritic pain in shoulders and knees, hypothyroidism(TSH 2.286 04/12/15). 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 She is declining gradually. Continue to reside in SNF for care needs.   Review of Systems:  Review of Systems  Constitutional: Negative for fever and diaphoresis.  HENT: Positive for hearing loss. Negative for congestion, ear pain and nosebleeds.   Eyes: Negative for pain, discharge and redness.  Respiratory: Negative for cough, shortness of breath and wheezing.   Cardiovascular: Positive for leg swelling. Negative for chest pain and palpitations.       Trace  Gastrointestinal: Negative for nausea, vomiting, diarrhea and constipation.  Genitourinary: Positive for frequency. Negative for dysuria, urgency, hematuria and flank pain.  Musculoskeletal: Positive for joint pain. Negative for myalgias, back pain and neck pain.       Shoulder pain L>R  Skin: Negative for rash.       Chronic venous insufficiency BLE  Neurological: Negative for dizziness, tremors, seizures, weakness and headaches.  Endo/Heme/Allergies: Does not bruise/bleed easily.  Psychiatric/Behavioral: Positive for memory loss. Negative for suicidal ideas and hallucinations. The patient is nervous/anxious.        Forgetful    Past Medical History  Diagnosis Date  . Anemia   . Arthritis   . CHF (congestive heart failure) ([place])   . Depression   . Hypertension   . Chronic kidney  disease   . Thyroid disease     Patient Active Problem List   Diagnosis Date Noted  . Thrombocytopenia ([place]) 07/02/2015  . Pain in left shoulder 04/09/2015  . B-cell prolymphocytic leukemia ([place]) 03/20/2015  . Pain in right hip 10/19/2014  . Allergic rhinitis 05/16/2014  . Hypothyroidism 05/11/2014  . UTI (urinary tract infection) 05/11/2014  . Chronic UTI (urinary tract infection) 05/11/2014  . Fall 07/01/2013  . Constipation 05/31/2013  . Closed wedge compression fracture of T7 vertebra 03/08/2013  . CKD (chronic kidney disease), stage II 03/08/2013  . Congestive heart failure ([place]) 10/29/2012  . Osteoarthritis of right knee 10/29/2012  . Iron deficiency anemia 10/22/2012  . Major depressive disorder, recurrent episode, severe ([place]) 10/22/2012  . Essential hypertension 10/22/2012  . Other primary cardiomyopathies 10/22/2012  . Atrial fibrillation ([place]) 10/22/2012  . Shortness of breath 10/22/2012    Allergies  Allergen Reactions  . Amiodarone   . Penicillins   . Amoxicillin   . Celecoxib   . Ciprofloxacin   . Codeine   . Diltiazem   . Lactobacillus [Acidophilus Lactobacillus]   . Metoprolol Succinate     All Beta-blockers  . Metronidazole Hcl   . Other     floricene dye netribudazike    Medications: Patient's Medications  New Prescriptions   No medications on file  Previous Medications   ACETAMINOPHEN (TYLENOL) 325 MG TABLET    Take 325 mg by mouth every 6 (six) hours as needed.     ACETAMINOPHEN (TYLENOL) 500 MG TABLET    Take  1 caplet by mouth twice daily   BETA CAROTENE W/MINERALS (OCUVITE) TABLET    Take 1 tablet by mouth daily.     CALCIUM-VITAMIN D (OSCAL WITH D) 500-200 MG-UNIT PER TABLET    Take 1 tablet by mouth 2 (two) times daily.     CAMPHOR-EUCALYPTUS-MENTHOL (VICKS VAPORUB) 4.7-1.2-2.6 % OINT    Apply topically. Rub on chest as directed  (may keep at bedside)   DOCUSATE SODIUM (COLACE) 100 MG CAPSULE    Take 100 mg by mouth 2 (two) times daily.      DOXYCYCLINE (VIBRAMYCIN) 100 MG CAPSULE    Take 100 mg by mouth 2 (two) times daily.   DULOXETINE (CYMBALTA) 20 MG CAPSULE       FOLIC ACID (FOLVITE) 1 MG TABLET    Take 1 mg by mouth daily.     FUROSEMIDE (LASIX) 40 MG TABLET       HYDROCODONE-ACETAMINOPHEN (NORCO/VICODIN) 5-325 MG PER TABLET    Take 1/2 tablet by mouth at bedtime   LEVOTHYROXINE (SYNTHROID, LEVOTHROID) 75 MCG TABLET       MAGNESIUM OXIDE (MAG-OX) 400 MG TABLET    Take 400 mg by mouth daily.     METOPROLOL SUCCINATE (TOPROL-XL) 25 MG 24 HR TABLET    Take 25 mg by mouth daily. Taking 1/2 tab    MULTIPLE VITAMIN (MULTIVITAMIN) TABLET    Take 1 tablet by mouth daily.     POTASSIUM CHLORIDE (MICRO-K) 10 MEQ CR CAPSULE    Take 10 mEq by mouth daily. Takes 20 meq (2) 10 meq daily    SENNOSIDES-DOCUSATE SODIUM (SENOKOT-S) 8.6-50 MG TABLET    Take by mouth daily.    Modified Medications   No medications on file  Discontinued Medications   No medications on file    Physical Exam: Filed Vitals:   07/03/15 0919  BP: 135/72  Pulse: 61  Temp: 97.2 F (36.2 C)  TempSrc: Tympanic  Resp: 19   There is no weight on file to calculate BMI.  Physical Exam  Constitutional: She is oriented to person, place, and time. She appears well-developed and well-nourished. No distress.  HENT:  Head: Normocephalic and atraumatic.  Eyes: Conjunctivae and EOM are normal. Pupils are equal, round, and reactive to light.  Neck: Normal range of motion. Neck supple. No JVD present. No thyromegaly present.  Cardiovascular: Normal rate.  Frequent extrasystoles are present.  Murmur heard. Pulses:      Dorsalis pedis pulses are 2+ on the right side, and 1+ on the left side.  EM 2/6  Pulmonary/Chest: She has no wheezes. She has no rales. She exhibits no tenderness.  Abdominal: Soft. Bowel sounds are normal. She exhibits no distension. There is no tenderness. There is no guarding.  Musculoskeletal: She exhibits edema (left knee) and tenderness.        Left shoulder: She exhibits decreased range of motion, tenderness, crepitus and pain.       Legs: Anterior distal femur 2x2cm firm nodule palpated, fixed, hard, non tender. Reduced ROM of the R+L shoulder for over head activities. Left wrist/left shoulder/left knee pain on and off. Crepitus to L shoulder, endorses paint with ROM. LLE trace edeam. Chronic R knee pain.  Lymphadenopathy:    She has no cervical adenopathy.  Neurological: She is alert and oriented to person, place, and time. She has normal reflexes. No cranial nerve deficit. She exhibits normal muscle tone. Coordination normal.  Skin: Skin is dry. No rash noted. She is not diaphoretic. There is  erythema (left knee).  baseline of mild heat of the left knee. Dark pigmented anterior RLE.   Psychiatric: She has a normal mood and affect. Her behavior is normal. Judgment and thought content normal. Cognition and memory are impaired. She exhibits abnormal recent memory.    Labs reviewed: Basic Metabolic Panel:  Recent Labs  10/23/14 04/12/15  NA 139 141  K 4.5 4.4  BUN  --  24*  CREATININE 1.1 1.2*    Liver Function Tests:  Recent Labs  04/12/15  AST 20  ALT 11  ALKPHOS 66    CBC:  Recent Labs  03/07/15 1314  04/18/15 1335 04/19/15 05/24/15 06/25/15  WBC 44.6*  < > 46.8* 37.9 45.5 42.0  HGB 12.0  < > 12.1 11.1* 11.0* 11.8*  HCT 38.1  < > 38.8 33* 33* 36  MCV 95  --  94  --   --   --   PLT 82*  < > 88* 80* 96* 99*  < > = values in this interval not displayed.  Lab Results  Component Value Date   TSH 2.29 04/12/2015   Lab Results  Component Value Date   HGBA1C 5.9 04/12/2015   Lab Results  Component Value Date   CHOL  10/17/2010    112        ATP III CLASSIFICATION:  <200     mg/dL   Desirable  200-239  mg/dL   Borderline High  >=240    mg/dL   High          HDL 30* 10/17/2010   LDLCALC  10/17/2010    66        Total Cholesterol/HDL:CHD Risk Coronary Heart Disease Risk Table                      Men   Women  1/2 Average Risk   3.4   3.3  Average Risk       5.0   4.4  2 X Average Risk   9.6   7.1  3 X Average Risk  23.4   11.0        Use the calculated Patient Ratio above and the CHD Risk Table to determine the patient's CHD Risk.        ATP III CLASSIFICATION (LDL):  <100     mg/dL   Optimal  100-129  mg/dL   Near or Above                    Optimal  130-159  mg/dL   Borderline  160-189  mg/dL   High  >190     mg/dL   Very High   TRIG 81 10/17/2010   CHOLHDL 3.7 10/17/2010    Significant Diagnostic Results since last visit: none  Patient Care Team: Pamala Duffel, MD as PCP - General (Cardiology)  Assessment/Plan Problem List Items Addressed This Visit    Atrial fibrillation ([place]) (Chronic)    Heart rate is in control, continue Metoprolol 12.5mg  qd      B-cell prolymphocytic leukemia ([place]) (Chronic)    03/08/15 Hematology: IV iron, 03/19/15 wbc 39.4, cbc wkly, stopped coumadin 06/25/15 wbc 42, Hgb 11.8, plt 99       Congestive heart failure (HCC) (Chronic)    Compensated clinically, continue Furosemide 40mg  qd, 04/12/15 Bun/creat 24/1.18, observe the patient      Essential hypertension (Chronic)    Controlled, continue Metoprolol 12.5mg  daily  Hypothyroidism (Chronic)    04/12/15 TSH 2.286 Continue Levothyroxine 81mcg daily.       Major depressive disorder, recurrent episode, severe (HCC) (Chronic)    Mood has been stable, continue Cymbalta 20mg  since 02/09/15      Osteoarthritis of right knee - Primary    Hx of infected joint replacement, now s/p fusion surgery, continue indefinite use of Doxycycline 100mg  bid. No s/s of infection persently.       Pain in left shoulder (Chronic)    Chronic left shoulder pain issue, X-ray L shoulder 05/10/15 showed no acute fx or dislocation, chronic rotate cuff pathology, conbtinue 1/2 Norco hs and prn      Thrombocytopenia (HCC) (Chronic)    Present as part of her prolymphocytic leukemia          Family/  staff Communication: continue SNF for care needs  Labs/tests ordered: none  [facility] Chauntae Hults NP Geriatrics [facility] [address], [place] [address] On Call:  [phone] & follow prompts after 5pm & weekends Office Phone:  [phone] Office Fax:  [phone]

## 2015-07-03 NOTE — Assessment & Plan Note (Signed)
Hx of infected joint replacement, now s/p fusion surgery, continue indefinite use of Doxycycline 100mg bid. No s/s of infection persently.  

## 2015-07-03 NOTE — Assessment & Plan Note (Signed)
Chronic left shoulder pain issue, X-ray L shoulder 05/10/15 showed no acute fx or dislocation, chronic rotate cuff pathology, conbtinue 1/2 Norco hs and prn

## 2015-07-03 NOTE — Assessment & Plan Note (Signed)
Present as part of her prolymphocytic leukemia

## 2015-07-11 ENCOUNTER — Encounter: Payer: Self-pay | Admitting: Hematology & Oncology

## 2015-07-18 ENCOUNTER — Ambulatory Visit: Payer: Self-pay | Admitting: Hematology & Oncology

## 2015-07-18 ENCOUNTER — Other Ambulatory Visit: Payer: Self-pay

## 2015-07-24 LAB — CBC AND DIFFERENTIAL
HEMATOCRIT: 40 % (ref 36–46)
HEMOGLOBIN: 13.2 g/dL (ref 12.0–16.0)
Platelets: 95 10*3/uL — AB (ref 150–399)
WBC: 54.8 10*3/mL

## 2015-07-25 ENCOUNTER — Other Ambulatory Visit: Payer: PRIVATE HEALTH INSURANCE

## 2015-07-25 ENCOUNTER — Telehealth: Payer: Self-pay | Admitting: Hematology & Oncology

## 2015-07-25 ENCOUNTER — Ambulatory Visit: Payer: Self-pay | Admitting: Hematology & Oncology

## 2015-07-25 NOTE — Telephone Encounter (Signed)
Patient's daughter came into office and cx her mother's apt due to her being sick and vomiting in the car.  The daughter stated she would call back to resch

## 2015-07-31 ENCOUNTER — Encounter: Payer: Self-pay | Admitting: Nurse Practitioner

## 2015-07-31 ENCOUNTER — Non-Acute Institutional Stay (SKILLED_NURSING_FACILITY): Payer: Medicare Other | Admitting: Nurse Practitioner

## 2015-07-31 DIAGNOSIS — C9130 Prolymphocytic leukemia of B-cell type not having achieved remission: Secondary | ICD-10-CM | POA: Diagnosis not present

## 2015-07-31 DIAGNOSIS — M25512 Pain in left shoulder: Secondary | ICD-10-CM | POA: Diagnosis not present

## 2015-07-31 DIAGNOSIS — M1711 Unilateral primary osteoarthritis, right knee: Secondary | ICD-10-CM

## 2015-07-31 DIAGNOSIS — F333 Major depressive disorder, recurrent, severe with psychotic symptoms: Secondary | ICD-10-CM

## 2015-07-31 DIAGNOSIS — I5040 Unspecified combined systolic (congestive) and diastolic (congestive) heart failure: Secondary | ICD-10-CM | POA: Diagnosis not present

## 2015-07-31 DIAGNOSIS — E039 Hypothyroidism, unspecified: Secondary | ICD-10-CM | POA: Diagnosis not present

## 2015-07-31 DIAGNOSIS — I10 Essential (primary) hypertension: Secondary | ICD-10-CM

## 2015-07-31 DIAGNOSIS — I482 Chronic atrial fibrillation: Secondary | ICD-10-CM

## 2015-07-31 DIAGNOSIS — I4821 Permanent atrial fibrillation: Secondary | ICD-10-CM

## 2015-07-31 NOTE — Assessment & Plan Note (Signed)
03/08/15 Hematology: IV iron, 03/19/15 wbc 39.4, cbc wkly, stopped coumadin 06/25/15 wbc 42, Hgb 11.8, plt 99 07/24/15 wbc 54.8, Hgb 13.2, plt 16.5

## 2015-07-31 NOTE — Assessment & Plan Note (Signed)
Controlled, continue Metoprolol 12.5mg daily.    

## 2015-07-31 NOTE — Assessment & Plan Note (Signed)
Hx of infected joint replacement, now s/p fusion surgery, continue indefinite use of Doxycycline 100mg bid. No s/s of infection persently.  

## 2015-07-31 NOTE — Assessment & Plan Note (Signed)
Heart rate is in control, continue Metoprolol 12.5mg qd.  

## 2015-07-31 NOTE — Assessment & Plan Note (Signed)
04/12/15 TSH 2.286 Continue Levothyroxine 18mcg dai

## 2015-07-31 NOTE — Progress Notes (Signed)
Patient ID: [name], female   DOB: 03-29-1915, 82 y.o.   MRN: [id number]  Location:  SNF [facility] Provider:  Marlana Latus NP  Code Status:  DNR Goals of care: Advanced Directive information    Chief Complaint  Patient presents with  . Medical Management of Chronic Issues     HPI: Patient is a 82 y.o. female seen in the SNF at [facility] today for evaluation of Chronic left shoulder pain is better managed with Norco. Hx Afib, HTN, blood sugar(HgbA1c 5.9 04/12/15) depression, dementia, multiple arthritic pain in shoulders and knees, hypothyroidism(TSH 2.286 04/12/15). Recent she has been diagnosed AML with elevated wbc in 54.8,000s 07/23/16. She is declining gradually. Continue to reside in SNF for care needs.   Review of Systems:  Review of Systems  Constitutional: Negative for fever and diaphoresis.  HENT: Positive for hearing loss. Negative for congestion, ear pain and nosebleeds.   Eyes: Negative for pain, discharge and redness.  Respiratory: Negative for cough, shortness of breath and wheezing.   Cardiovascular: Positive for leg swelling. Negative for chest pain and palpitations.       Trace  Gastrointestinal: Negative for nausea, vomiting, diarrhea and constipation.  Genitourinary: Positive for frequency. Negative for dysuria, urgency, hematuria and flank pain.  Musculoskeletal: Positive for joint pain. Negative for myalgias, back pain and neck pain.       Shoulder pain L>R  Skin: Negative for rash.       Chronic venous insufficiency BLE  Neurological: Negative for dizziness, tremors, seizures, weakness and headaches.  Endo/Heme/Allergies: Does not bruise/bleed easily.  Psychiatric/Behavioral: Positive for memory loss. Negative for suicidal ideas and hallucinations. The patient is nervous/anxious.        Forgetful    Past Medical History  Diagnosis Date  . Anemia   . Arthritis   . CHF (congestive heart failure) ([place])   . Depression   . Hypertension   . Chronic  kidney disease   . Thyroid disease     Patient Active Problem List   Diagnosis Date Noted  . Thrombocytopenia ([place]) 07/02/2015  . Pain in left shoulder 04/09/2015  . 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 Pain in right hip 10/19/2014  . Allergic rhinitis 05/16/2014  . Hypothyroidism 05/11/2014  . UTI (urinary tract infection) 05/11/2014  . Chronic UTI (urinary tract infection) 05/11/2014  . Fall 07/01/2013  . Constipation 05/31/2013  . Closed wedge compression fracture of T7 vertebra 03/08/2013  . CKD (chronic kidney disease), stage II 03/08/2013  . Congestive heart failure ([place]) 10/29/2012  . Osteoarthritis of right knee 10/29/2012  . Iron deficiency anemia 10/22/2012  . Major depressive disorder, recurrent episode, severe ([place]) 10/22/2012  . Essential hypertension 10/22/2012  . Other primary cardiomyopathies 10/22/2012  . Atrial fibrillation ([place]) 10/22/2012  . Shortness of breath 10/22/2012    Allergies  Allergen Reactions  . Amiodarone   . Penicillins   . Amoxicillin   . Celecoxib   . Ciprofloxacin   . Codeine   . Diltiazem   . Lactobacillus [Acidophilus Lactobacillus]   . Metoprolol Succinate     All Beta-blockers  . Metronidazole Hcl   . Other     floricene dye netribudazike    Medications: Patient's Medications  New Prescriptions   No medications on file  Previous Medications   ACETAMINOPHEN (TYLENOL) 325 MG TABLET    Take 325 mg by mouth every 6 (six) hours as needed.     ACETAMINOPHEN (TYLENOL) 500 MG TABLET  Take 1 caplet by mouth twice daily   BETA CAROTENE W/MINERALS (OCUVITE) TABLET    Take 1 tablet by mouth daily.     CALCIUM-VITAMIN D (OSCAL WITH D) 500-200 MG-UNIT PER TABLET    Take 1 tablet by mouth 2 (two) times daily.     CAMPHOR-EUCALYPTUS-MENTHOL (VICKS VAPORUB) 4.7-1.2-2.6 % OINT    Apply topically. Rub on chest as directed  (may keep at bedside)   DOCUSATE SODIUM (COLACE) 100 MG CAPSULE    Take 100 mg by mouth 2 (two) times  daily.     DOXYCYCLINE (VIBRAMYCIN) 100 MG CAPSULE    Take 100 mg by mouth 2 (two) times daily.   DULOXETINE (CYMBALTA) 20 MG CAPSULE       FOLIC ACID (FOLVITE) 1 MG TABLET    Take 1 mg by mouth daily.     FUROSEMIDE (LASIX) 40 MG TABLET       HYDROCODONE-ACETAMINOPHEN (NORCO/VICODIN) 5-325 MG PER TABLET    Take 1/2 tablet by mouth at bedtime   LEVOTHYROXINE (SYNTHROID, LEVOTHROID) 75 MCG TABLET       MAGNESIUM OXIDE (MAG-OX) 400 MG TABLET    Take 400 mg by mouth daily.     METOPROLOL SUCCINATE (TOPROL-XL) 25 MG 24 HR TABLET    Take 25 mg by mouth daily. Taking 1/2 tab    MULTIPLE VITAMIN (MULTIVITAMIN) TABLET    Take 1 tablet by mouth daily.     POTASSIUM CHLORIDE (MICRO-K) 10 MEQ CR CAPSULE    Take 10 mEq by mouth daily. Takes 20 meq (2) 10 meq daily    SENNOSIDES-DOCUSATE SODIUM (SENOKOT-S) 8.6-50 MG TABLET    Take by mouth daily.    Modified Medications   No medications on file  Discontinued Medications   No medications on file    Physical Exam: Filed Vitals:   07/31/15 1500  BP: 96/62  Pulse: 62  Temp: 98.5 F (36.9 C)  TempSrc: Tympanic  Resp: 18   There is no weight on file to calculate BMI.  Physical Exam  Constitutional: She is oriented to person, place, and time. She appears well-developed and well-nourished. No distress.  HENT:  Head: Normocephalic and atraumatic.  Eyes: Conjunctivae and EOM are normal. Pupils are equal, round, and reactive to light.  Neck: Normal range of motion. Neck supple. No JVD present. No thyromegaly present.  Cardiovascular: Normal rate.  Frequent extrasystoles are present.  Murmur heard. Pulses:      Dorsalis pedis pulses are 2+ on the right side, and 1+ on the left side.  EM 2/6  Pulmonary/Chest: She has no wheezes. She has no rales. She exhibits no tenderness.  Abdominal: Soft. Bowel sounds are normal. She exhibits no distension. There is no tenderness. There is no guarding.  Musculoskeletal: She exhibits edema (left knee) and  tenderness.       Left shoulder: She exhibits decreased range of motion, tenderness, crepitus and pain.       Legs: Anterior distal femur 2x2cm firm nodule palpated, fixed, hard, non tender. Reduced ROM of the R+L shoulder for over head activities. Left wrist/left shoulder/left knee pain on and off. Crepitus to L shoulder, endorses paint with ROM. LLE trace edeam. Chronic R knee pain.  Lymphadenopathy:    She has no cervical adenopathy.  Neurological: She is alert and oriented to person, place, and time. She has normal reflexes. No cranial nerve deficit. She exhibits normal muscle tone. Coordination normal.  Skin: Skin is dry. No rash noted. She is not diaphoretic. There  is erythema (left knee).  baseline of mild heat of the left knee. Dark pigmented anterior RLE.   Psychiatric: She has a normal mood and affect. Her behavior is normal. Judgment and thought content normal. Cognition and memory are impaired. She exhibits abnormal recent memory.    Labs reviewed: Basic Metabolic Panel:  Recent Labs  10/23/14 04/12/15  NA 139 141  K 4.5 4.4  BUN  --  24*  CREATININE 1.1 1.2*    Liver Function Tests:  Recent Labs  04/12/15  AST 20  ALT 11  ALKPHOS 66    CBC:  Recent Labs  03/07/15 1314  04/18/15 1335  05/24/15 06/25/15 07/24/15  WBC 44.6*  < > 46.8*  < > 45.5 42.0 54.8  HGB 12.0  < > 12.1  < > 11.0* 11.8* 13.2  HCT 38.1  < > 38.8  < > 33* 36 40  MCV 95  --  94  --   --   --   --   PLT 82*  < > 88*  < > 96* 99* 95*  < > = values in this interval not displayed.  Lab Results  Component Value Date   TSH 2.29 04/12/2015   Lab Results  Component Value Date   HGBA1C 5.9 04/12/2015   Lab Results  Component Value Date   CHOL  10/17/2010    112        ATP III CLASSIFICATION:  <200     mg/dL   Desirable  200-239  mg/dL   Borderline High  >=240    mg/dL   High          HDL 30* 10/17/2010   LDLCALC  10/17/2010    66        Total Cholesterol/HDL:CHD Risk Coronary Heart  Disease Risk Table                     Men   Women  1/2 Average Risk   3.4   3.3  Average Risk       5.0   4.4  2 X Average Risk   9.6   7.1  3 X Average Risk  23.4   11.0        Use the calculated Patient Ratio above and the CHD Risk Table to determine the patient's CHD Risk.        ATP III CLASSIFICATION (LDL):  <100     mg/dL   Optimal  100-129  mg/dL   Near or Above                    Optimal  130-159  mg/dL   Borderline  160-189  mg/dL   High  >190     mg/dL   Very High   TRIG 81 10/17/2010   CHOLHDL 3.7 10/17/2010    Significant Diagnostic Results since last visit: none  Patient Care Team: Pamala Duffel, MD as PCP - General (Cardiology)  Assessment/Plan Problem List Items Addressed This Visit    Pain in left shoulder (Chronic)    Better worsened left shoulder pain on chronic issue, X-ray L shoulder 05/10/15 showed no acute fx or dislocation, chronic rotate cuff pathology, conbtinue 1/2 Norco bid and Tylenol prn      Osteoarthritis of right knee    Hx of infected joint replacement, now s/p fusion surgery, continue indefinite use of Doxycycline 100mg  bid. No s/s of infection persently.       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, [place] [address] On Call:  [phone] & follow prompts after 5pm & weekends Office Phone:  [phone] Office Fax:  [phone]

## 2015-07-31 NOTE — Assessment & Plan Note (Signed)
Compensated clinically, continue Furosemide 40mg  qd, KCL 49meq,  04/12/15 Bun/creat 24/1.18, observe the patient

## 2015-07-31 NOTE — Assessment & Plan Note (Signed)
Better worsened left shoulder pain on chronic issue, X-ray L shoulder 05/10/15 showed no acute fx or dislocation, chronic rotate cuff pathology, conbtinue 1/2 Norco bid and Tylenol prn

## 2015-07-31 NOTE — Assessment & Plan Note (Signed)
Mood has been stable, continue Cymbalta 20mg since 02/09/15 

## 2015-08-03 ENCOUNTER — Encounter: Payer: Self-pay | Admitting: Nurse Practitioner

## 2015-08-03 ENCOUNTER — Non-Acute Institutional Stay (SKILLED_NURSING_FACILITY): Payer: Medicare Other | Admitting: Nurse Practitioner

## 2015-08-03 DIAGNOSIS — F333 Major depressive disorder, recurrent, severe with psychotic symptoms: Secondary | ICD-10-CM

## 2015-08-03 DIAGNOSIS — I5040 Unspecified combined systolic (congestive) and diastolic (congestive) heart failure: Secondary | ICD-10-CM | POA: Diagnosis not present

## 2015-08-03 DIAGNOSIS — M1711 Unilateral primary osteoarthritis, right knee: Secondary | ICD-10-CM

## 2015-08-03 DIAGNOSIS — E039 Hypothyroidism, unspecified: Secondary | ICD-10-CM

## 2015-08-03 DIAGNOSIS — J189 Pneumonia, unspecified organism: Secondary | ICD-10-CM

## 2015-08-03 DIAGNOSIS — I10 Essential (primary) hypertension: Secondary | ICD-10-CM | POA: Diagnosis not present

## 2015-08-03 DIAGNOSIS — I482 Chronic atrial fibrillation: Secondary | ICD-10-CM

## 2015-08-03 DIAGNOSIS — C9130 Prolymphocytic leukemia of B-cell type not having achieved remission: Secondary | ICD-10-CM

## 2015-08-03 DIAGNOSIS — J181 Lobar pneumonia, unspecified organism: Principal | ICD-10-CM

## 2015-08-03 DIAGNOSIS — I4821 Permanent atrial fibrillation: Secondary | ICD-10-CM

## 2015-08-03 NOTE — Assessment & Plan Note (Signed)
Hx of infected joint replacement, now s/p fusion surgery, continue indefinite use of Doxycycline 100mg bid. No s/s of infection persently.  

## 2015-08-03 NOTE — Assessment & Plan Note (Signed)
Heart rate is in control, continue Metoprolol 12.5mg qd.  

## 2015-08-03 NOTE — Assessment & Plan Note (Signed)
Cough, malaise, CXR 08/02/15 left basilar opacity, Rocephin 1gm IM bid x 10 days. Observe.

## 2015-08-03 NOTE — Assessment & Plan Note (Signed)
Compensated clinically, continue Furosemide 40mg  qd, KCL 48meq,  04/12/15 Bun/creat 24/1.18, observe the patient

## 2015-08-03 NOTE — Assessment & Plan Note (Signed)
Mood has been stable, continue Cymbalta 20mg since 02/09/15 

## 2015-08-03 NOTE — Assessment & Plan Note (Signed)
Controlled, continue Metoprolol 12.5mg daily.    

## 2015-08-03 NOTE — Assessment & Plan Note (Signed)
04/12/15 TSH 2.286 Continue Levothyroxine 75mcg daily.  

## 2015-08-03 NOTE — Assessment & Plan Note (Signed)
03/08/15 Hematology: IV iron, 03/19/15 wbc 39.4, cbc wkly, stopped coumadin 06/25/15 wbc 42, Hgb 11.8, plt 99 07/24/15 wbc 54.8, Hgb 13.2, plt 16.5

## 2015-08-03 NOTE — Progress Notes (Signed)
Patient ID: [name], female   DOB: 05-12-1915, 82 y.o.   MRN: [id number]  Location:  SNF [facility] Provider:  Marlana Latus NP  Code Status:  DNR Goals of care: Advanced Directive information    Chief Complaint  Patient presents with  . Medical Management of Chronic Issues  . Acute Visit    Left basilar PNA     HPI: Patient is a 82 y.o. female seen in the SNF at [facility] today for evaluation of acute cough, CXR 08/02/15 left basilar opacity. Denied chest pain, no O2 desaturation, denied palpitation. Chronic left shoulder pain is better managed with Norco. Hx Afib, HTN, blood sugar(HgbA1c 5.9 04/12/15) depression, dementia, multiple arthritic pain in shoulders and knees, hypothyroidism(TSH 2.286 04/12/15). Recent she has been diagnosed AML with elevated wbc in 54.8,000s 07/23/16. She is declining gradually. Continue to reside in SNF for care needs.   Review of Systems  Constitutional: Positive for malaise/fatigue. Negative for fever and diaphoresis.  HENT: Positive for hearing loss. Negative for congestion, ear pain and nosebleeds.   Eyes: Negative for pain, discharge and redness.  Respiratory: Positive for cough. Negative for shortness of breath and wheezing.   Cardiovascular: Positive for leg swelling. Negative for chest pain and palpitations.       Trace  Gastrointestinal: Negative for nausea, vomiting, diarrhea and constipation.  Genitourinary: Positive for frequency. Negative for dysuria, urgency, hematuria and flank pain.  Musculoskeletal: Positive for joint pain. Negative for myalgias, back pain and neck pain.       Shoulder pain L>R  Skin: Negative for rash.       Chronic venous insufficiency BLE  Neurological: Negative for dizziness, tremors, seizures, weakness and headaches.  Endo/Heme/Allergies: Does not bruise/bleed easily.  Psychiatric/Behavioral: Positive for memory loss. Negative for suicidal ideas and hallucinations. The patient is nervous/anxious.    Forgetful    Past Medical History  Diagnosis Date  . Anemia   . Arthritis   . CHF (congestive heart failure) ([place])   . Depression   . Hypertension   . Chronic kidney disease   . Thyroid disease     Patient Active Problem List   Diagnosis Date Noted  . Left lower lobe pneumonia 08/03/2015  . Thrombocytopenia ([place]) 07/02/2015  . Pain in left shoulder 04/09/2015  . 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 Pain in right hip 10/19/2014  . Allergic rhinitis 05/16/2014  . Hypothyroidism 05/11/2014  . UTI (urinary tract infection) 05/11/2014  . Chronic UTI (urinary tract infection) 05/11/2014  . Fall 07/01/2013  . Constipation 05/31/2013  . Closed wedge compression fracture of T7 vertebra 03/08/2013  . CKD (chronic kidney disease), stage II 03/08/2013  . Congestive heart failure ([place]) 10/29/2012  . Osteoarthritis of right knee 10/29/2012  . Iron deficiency anemia 10/22/2012  . Major depressive disorder, recurrent episode, severe ([place]) 10/22/2012  . Essential hypertension 10/22/2012  . Other primary cardiomyopathies 10/22/2012  . Atrial fibrillation ([place]) 10/22/2012  . Shortness of breath 10/22/2012    Allergies  Allergen Reactions  . Amiodarone   . Penicillins   . Amoxicillin   . Celecoxib   . Ciprofloxacin   . Codeine   . Diltiazem   . Lactobacillus [Acidophilus Lactobacillus]   . Metoprolol Succinate     All Beta-blockers  . Metronidazole Hcl   . Other     floricene dye netribudazike    Medications: Patient's Medications  New Prescriptions   No medications on file  Previous Medications   ACETAMINOPHEN (TYLENOL) 325  MG TABLET    Take 325 mg by mouth every 6 (six) hours as needed.     ACETAMINOPHEN (TYLENOL) 500 MG TABLET    Take 1 caplet by mouth twice daily   BETA CAROTENE W/MINERALS (OCUVITE) TABLET    Take 1 tablet by mouth daily.     CALCIUM-VITAMIN D (OSCAL WITH D) 500-200 MG-UNIT PER TABLET    Take 1 tablet by mouth 2 (two) times daily.      CAMPHOR-EUCALYPTUS-MENTHOL (VICKS VAPORUB) 4.7-1.2-2.6 % OINT    Apply topically. Rub on chest as directed  (may keep at bedside)   DOCUSATE SODIUM (COLACE) 100 MG CAPSULE    Take 100 mg by mouth 2 (two) times daily.     DOXYCYCLINE (VIBRAMYCIN) 100 MG CAPSULE    Take 100 mg by mouth 2 (two) times daily.   DULOXETINE (CYMBALTA) 20 MG CAPSULE       FOLIC ACID (FOLVITE) 1 MG TABLET    Take 1 mg by mouth daily.     FUROSEMIDE (LASIX) 40 MG TABLET       HYDROCODONE-ACETAMINOPHEN (NORCO/VICODIN) 5-325 MG PER TABLET    Take 1/2 tablet by mouth at bedtime   LEVOTHYROXINE (SYNTHROID, LEVOTHROID) 75 MCG TABLET       MAGNESIUM OXIDE (MAG-OX) 400 MG TABLET    Take 400 mg by mouth daily.     METOPROLOL SUCCINATE (TOPROL-XL) 25 MG 24 HR TABLET    Take 25 mg by mouth daily. Taking 1/2 tab    MULTIPLE VITAMIN (MULTIVITAMIN) TABLET    Take 1 tablet by mouth daily.     POTASSIUM CHLORIDE (MICRO-K) 10 MEQ CR CAPSULE    Take 10 mEq by mouth daily. Takes 20 meq (2) 10 meq daily    SENNOSIDES-DOCUSATE SODIUM (SENOKOT-S) 8.6-50 MG TABLET    Take by mouth daily.    Modified Medications   No medications on file  Discontinued Medications   No medications on file    Physical Exam: Filed Vitals:   08/03/15 1252  BP: 115/75  Pulse: 64  Temp: 96.8 F (36 C)  TempSrc: Tympanic  Resp: 20   There is no weight on file to calculate BMI.  Physical Exam  Constitutional: She is oriented to person, place, and time. She appears well-developed and well-nourished. No distress.  HENT:  Head: Normocephalic and atraumatic.  Eyes: Conjunctivae and EOM are normal. Pupils are equal, round, and reactive to light.  Neck: Normal range of motion. Neck supple. No JVD present. No thyromegaly present.  Cardiovascular: Normal rate.  Frequent extrasystoles are present.  Murmur heard. Pulses:      Dorsalis pedis pulses are 2+ on the right side, and 1+ on the left side.  EM 2/6  Pulmonary/Chest: She has no wheezes. She has rales.  She exhibits no tenderness.  Abdominal: Soft. Bowel sounds are normal. She exhibits no distension. There is no tenderness. There is no guarding.  Musculoskeletal: She exhibits edema (left knee) and tenderness.       Left shoulder: She exhibits decreased range of motion, tenderness, crepitus and pain.       Legs: Anterior distal femur 2x2cm firm nodule palpated, fixed, hard, non tender. Reduced ROM of the R+L shoulder for over head activities. Left wrist/left shoulder/left knee pain on and off. Crepitus to L shoulder, endorses paint with ROM. LLE trace edeam. Chronic R knee pain.  Lymphadenopathy:    She has no cervical adenopathy.  Neurological: She is alert and oriented to person, place, and time. She  has normal reflexes. No cranial nerve deficit. She exhibits normal muscle tone. Coordination normal.  Skin: Skin is dry. No rash noted. She is not diaphoretic. There is erythema (left knee).  baseline of mild heat of the left knee. Dark pigmented anterior RLE.   Psychiatric: She has a normal mood and affect. Her behavior is normal. Judgment and thought content normal. Cognition and memory are impaired. She exhibits abnormal recent memory.    Labs reviewed: Basic Metabolic Panel:  Recent Labs  10/23/14 04/12/15  NA 139 141  K 4.5 4.4  BUN  --  24*  CREATININE 1.1 1.2*    Liver Function Tests:  Recent Labs  04/12/15  AST 20  ALT 11  ALKPHOS 66    CBC:  Recent Labs  03/07/15 1314  04/18/15 1335  05/24/15 06/25/15 07/24/15  WBC 44.6*  < > 46.8*  < > 45.5 42.0 54.8  HGB 12.0  < > 12.1  < > 11.0* 11.8* 13.2  HCT 38.1  < > 38.8  < > 33* 36 40  MCV 95  --  94  --   --   --   --   PLT 82*  < > 88*  < > 96* 99* 95*  < > = values in this interval not displayed.  Lab Results  Component Value Date   TSH 2.29 04/12/2015   Lab Results  Component Value Date   HGBA1C 5.9 04/12/2015   Lab Results  Component Value Date   CHOL  10/17/2010    112        ATP III CLASSIFICATION:   <200     mg/dL   Desirable  200-239  mg/dL   Borderline High  >=240    mg/dL   High          HDL 30* 10/17/2010   LDLCALC  10/17/2010    66        Total Cholesterol/HDL:CHD Risk Coronary Heart Disease Risk Table                     Men   Women  1/2 Average Risk   3.4   3.3  Average Risk       5.0   4.4  2 X Average Risk   9.6   7.1  3 X Average Risk  23.4   11.0        Use the calculated Patient Ratio above and the CHD Risk Table to determine the patient's CHD Risk.        ATP III CLASSIFICATION (LDL):  <100     mg/dL   Optimal  100-129  mg/dL   Near or Above                    Optimal  130-159  mg/dL   Borderline  160-189  mg/dL   High  >190     mg/dL   Very High   TRIG 81 10/17/2010   CHOLHDL 3.7 10/17/2010    Significant Diagnostic Results since last visit: none  Patient Care Team: Pamala Duffel, MD as PCP - General (Cardiology)  Assessment/Plan Problem List Items Addressed This Visit    Osteoarthritis of right knee    Hx of infected joint replacement, now s/p fusion surgery, continue indefinite use of Doxycycline 100mg  bid. No s/s of infection persently.       Major depressive disorder, recurrent episode, severe (HCC) (Chronic)    Mood has been stable, continue  Cymbalta 20mg  since 02/09/15      Left lower lobe pneumonia - Primary    Cough, malaise, CXR 08/02/15 left basilar opacity, Rocephin 1gm IM bid x 10 days. Observe.       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, [place] [address] On Call:  [phone] & follow prompts after 5pm & weekends Office Phone:  [phone] Office Fax:  [phone]

## 2015-08-10 ENCOUNTER — Telehealth: Payer: Self-pay | Admitting: *Deleted

## 2015-08-10 NOTE — Telephone Encounter (Signed)
Spoke with daughter to reschedule appointment due to provider being unavailable. Appointment rescheduled for February 13th.

## 2015-08-13 ENCOUNTER — Ambulatory Visit: Payer: Self-pay | Admitting: Hematology & Oncology

## 2015-08-13 ENCOUNTER — Other Ambulatory Visit: Payer: PRIVATE HEALTH INSURANCE

## 2015-08-14 ENCOUNTER — Non-Acute Institutional Stay (SKILLED_NURSING_FACILITY): Payer: Medicare Other | Admitting: Nurse Practitioner

## 2015-08-14 ENCOUNTER — Encounter: Payer: Self-pay | Admitting: Nurse Practitioner

## 2015-08-14 DIAGNOSIS — M25512 Pain in left shoulder: Secondary | ICD-10-CM

## 2015-08-14 DIAGNOSIS — J189 Pneumonia, unspecified organism: Secondary | ICD-10-CM

## 2015-08-14 DIAGNOSIS — I4821 Permanent atrial fibrillation: Secondary | ICD-10-CM

## 2015-08-14 DIAGNOSIS — I10 Essential (primary) hypertension: Secondary | ICD-10-CM

## 2015-08-14 DIAGNOSIS — I482 Chronic atrial fibrillation: Secondary | ICD-10-CM | POA: Diagnosis not present

## 2015-08-14 DIAGNOSIS — M1711 Unilateral primary osteoarthritis, right knee: Secondary | ICD-10-CM

## 2015-08-14 DIAGNOSIS — F333 Major depressive disorder, recurrent, severe with psychotic symptoms: Secondary | ICD-10-CM | POA: Diagnosis not present

## 2015-08-14 DIAGNOSIS — E039 Hypothyroidism, unspecified: Secondary | ICD-10-CM | POA: Diagnosis not present

## 2015-08-14 DIAGNOSIS — I5020 Unspecified systolic (congestive) heart failure: Secondary | ICD-10-CM | POA: Diagnosis not present

## 2015-08-14 DIAGNOSIS — C9130 Prolymphocytic leukemia of B-cell type not having achieved remission: Secondary | ICD-10-CM | POA: Diagnosis not present

## 2015-08-14 DIAGNOSIS — J181 Lobar pneumonia, unspecified organism: Secondary | ICD-10-CM

## 2015-08-14 NOTE — Assessment & Plan Note (Signed)
Controlled, continue Metoprolol 12.5mg daily.    

## 2015-08-14 NOTE — Assessment & Plan Note (Signed)
03/08/15 Hematology: IV iron, 03/19/15 wbc 39.4, cbc wkly, stopped coumadin 06/25/15 wbc 42, Hgb 11.8, plt 99 07/24/15 wbc 54.8, Hgb 13.2, plt 16.5 08/14/15 update CBC

## 2015-08-14 NOTE — Assessment & Plan Note (Signed)
Compensated clinically, risk for developing acute CHF related RVR, continue Furosemide 40mg  qd, KCL 34meq,  04/12/15 Bun/creat 24/1.18, observe the patient. Update CMP

## 2015-08-14 NOTE — Assessment & Plan Note (Signed)
Heart rate is not in control, HR 112, increase Metoprolol 12.5mg  to bid, VS q shift, EKG

## 2015-08-14 NOTE — Assessment & Plan Note (Signed)
04/12/15 TSH 2.286 Continue Levothyroxine 44mcg daily, update TSH

## 2015-08-14 NOTE — Progress Notes (Signed)
Patient ID: [name], female   DOB: Jun 23, 1915, 82 y.o.   MRN: [id number]  Location:  SNF [facility] Provider:  Marlana Latus NP  Code Status:  DNR Goals of care: Advanced Directive information    Chief Complaint  Patient presents with  . Medical Management of Chronic Issues  . Acute Visit    palpitation, HR 110s     HPI: Patient is a 82 y.o. female seen in the SNF at [facility] today for evaluation of palpitation, HR 112, not feeling well, denied chest pain or sense of doom, recently treated acute cough, CXR 08/02/15 left basilar opacity. Denied chest pain, no O2 desaturation, denied palpitation. Chronic left shoulder pain is better managed with Norco. Hx Afib, HTN, blood sugar(HgbA1c 5.9 04/12/15) depression, dementia, multiple arthritic pain in shoulders and knees, hypothyroidism(TSH 2.286 04/12/15). Recent she has been diagnosed AML with elevated wbc in 54.8,000s 07/23/16. She is declining gradually. Continue to reside in SNF for care needs.   Review of Systems  Constitutional: Positive for malaise/fatigue. Negative for fever and diaphoresis.  HENT: Positive for hearing loss. Negative for congestion, ear pain and nosebleeds.   Eyes: Negative for pain, discharge and redness.  Respiratory: Negative for cough, shortness of breath and wheezing.   Cardiovascular: Positive for palpitations and leg swelling. Negative for chest pain.       Trace BLE edema C/o palpitation, HR 112, denied chest pain or pressure.   Gastrointestinal: Negative for nausea, vomiting, diarrhea and constipation.  Genitourinary: Positive for frequency. Negative for dysuria, urgency, hematuria and flank pain.  Musculoskeletal: Positive for joint pain. Negative for myalgias, back pain and neck pain.       Shoulder pain L>R  Skin: Negative for rash.       Chronic venous insufficiency BLE  Neurological: Negative for dizziness, tremors, seizures, weakness and headaches.  Endo/Heme/Allergies: Does not bruise/bleed  easily.  Psychiatric/Behavioral: Positive for memory loss. Negative for suicidal ideas and hallucinations. The patient is nervous/anxious.        Forgetful    Past Medical History  Diagnosis Date  . Anemia   . Arthritis   . CHF (congestive heart failure) ([place])   . Depression   . Hypertension   . Chronic kidney disease   . Thyroid disease     Patient Active Problem List   Diagnosis Date Noted  . Left lower lobe pneumonia 08/03/2015  . Thrombocytopenia ([place]) 07/02/2015  . Pain in left shoulder 04/09/2015  . 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 Pain in right hip 10/19/2014  . Allergic rhinitis 05/16/2014  . Hypothyroidism 05/11/2014  . UTI (urinary tract infection) 05/11/2014  . Chronic UTI (urinary tract infection) 05/11/2014  . Fall 07/01/2013  . Constipation 05/31/2013  . Closed wedge compression fracture of T7 vertebra 03/08/2013  . CKD (chronic kidney disease), stage II 03/08/2013  . Congestive heart failure ([place]) 10/29/2012  . Osteoarthritis of right knee 10/29/2012  . Iron deficiency anemia 10/22/2012  . Major depressive disorder, recurrent episode, severe ([place]) 10/22/2012  . Essential hypertension 10/22/2012  . Other primary cardiomyopathies 10/22/2012  . Atrial fibrillation ([place]) 10/22/2012  . Shortness of breath 10/22/2012    Allergies  Allergen Reactions  . Amiodarone   . Penicillins   . Amoxicillin   . Celecoxib   . Ciprofloxacin   . Codeine   . Diltiazem   . Lactobacillus [Acidophilus Lactobacillus]   . Metoprolol Succinate     All Beta-blockers  . Metronidazole Hcl   . Other  floricene dye netribudazike    Medications: Patient's Medications  New Prescriptions   No medications on file  Previous Medications   ACETAMINOPHEN (TYLENOL) 325 MG TABLET    Take 325 mg by mouth every 6 (six) hours as needed.     ACETAMINOPHEN (TYLENOL) 500 MG TABLET    Take 1 caplet by mouth twice daily   BETA CAROTENE W/MINERALS (OCUVITE) TABLET     Take 1 tablet by mouth daily.     CALCIUM-VITAMIN D (OSCAL WITH D) 500-200 MG-UNIT PER TABLET    Take 1 tablet by mouth 2 (two) times daily.     CAMPHOR-EUCALYPTUS-MENTHOL (VICKS VAPORUB) 4.7-1.2-2.6 % OINT    Apply topically. Rub on chest as directed  (may keep at bedside)   DOCUSATE SODIUM (COLACE) 100 MG CAPSULE    Take 100 mg by mouth 2 (two) times daily.     DOXYCYCLINE (VIBRAMYCIN) 100 MG CAPSULE    Take 100 mg by mouth 2 (two) times daily.   DULOXETINE (CYMBALTA) 20 MG CAPSULE       FOLIC ACID (FOLVITE) 1 MG TABLET    Take 1 mg by mouth daily.     FUROSEMIDE (LASIX) 40 MG TABLET       HYDROCODONE-ACETAMINOPHEN (NORCO/VICODIN) 5-325 MG PER TABLET    Take 1/2 tablet by mouth at bedtime   LEVOTHYROXINE (SYNTHROID, LEVOTHROID) 75 MCG TABLET       MAGNESIUM OXIDE (MAG-OX) 400 MG TABLET    Take 400 mg by mouth daily.     METOPROLOL SUCCINATE (TOPROL-XL) 25 MG 24 HR TABLET    Take 25 mg by mouth daily. Taking 1/2 tab    MULTIPLE VITAMIN (MULTIVITAMIN) TABLET    Take 1 tablet by mouth daily.     POTASSIUM CHLORIDE (MICRO-K) 10 MEQ CR CAPSULE    Take 10 mEq by mouth daily. Takes 20 meq (2) 10 meq daily    SENNOSIDES-DOCUSATE SODIUM (SENOKOT-S) 8.6-50 MG TABLET    Take by mouth daily.    Modified Medications   No medications on file  Discontinued Medications   No medications on file    Physical Exam: Filed Vitals:   08/14/15 1412  BP: 124/84  Pulse: 112  Temp: 96.1 F (35.6 C)  TempSrc: Tympanic  Resp: 20   There is no weight on file to calculate BMI.  Physical Exam  Constitutional: She is oriented to person, place, and time. She appears well-developed and well-nourished. No distress.  HENT:  Head: Normocephalic and atraumatic.  Eyes: Conjunctivae and EOM are normal. Pupils are equal, round, and reactive to light.  Neck: Normal range of motion. Neck supple. No JVD present. No thyromegaly present.  Cardiovascular: Normal rate.  Frequent extrasystoles are present.  Murmur  heard. Pulses:      Dorsalis pedis pulses are 2+ on the right side, and 1+ on the left side.  EM 2/6C/o palpitation, HR 112, denied chest pain or pressure.    Pulmonary/Chest: She has no wheezes. She has rales. She exhibits no tenderness.  Abdominal: Soft. Bowel sounds are normal. She exhibits no distension. There is no tenderness. There is no guarding.  Musculoskeletal: She exhibits edema (left knee) and tenderness.       Left shoulder: She exhibits decreased range of motion, tenderness, crepitus and pain.       Legs: Anterior distal femur 2x2cm firm nodule palpated, fixed, hard, non tender. Reduced ROM of the R+L shoulder for over head activities. Left wrist/left shoulder/left knee pain on and off. Crepitus to  L shoulder, endorses paint with ROM. LLE trace edeam. Chronic R knee pain.  Lymphadenopathy:    She has no cervical adenopathy.  Neurological: She is alert and oriented to person, place, and time. She has normal reflexes. No cranial nerve deficit. She exhibits normal muscle tone. Coordination normal.  Skin: Skin is dry. No rash noted. She is not diaphoretic. There is erythema (left knee).  baseline of mild heat of the left knee. Dark pigmented anterior RLE.   Psychiatric: She has a normal mood and affect. Her behavior is normal. Judgment and thought content normal. Cognition and memory are impaired. She exhibits abnormal recent memory.    Labs reviewed: Basic Metabolic Panel:  Recent Labs  10/23/14 04/12/15  NA 139 141  K 4.5 4.4  BUN  --  24*  CREATININE 1.1 1.2*    Liver Function Tests:  Recent Labs  04/12/15  AST 20  ALT 11  ALKPHOS 66    CBC:  Recent Labs  03/07/15 1314  04/18/15 1335  05/24/15 06/25/15 07/24/15  WBC 44.6*  < > 46.8*  < > 45.5 42.0 54.8  HGB 12.0  < > 12.1  < > 11.0* 11.8* 13.2  HCT 38.1  < > 38.8  < > 33* 36 40  MCV 95  --  94  --   --   --   --   PLT 82*  < > 88*  < > 96* 99* 95*  < > = values in this interval not displayed.  Lab  Results  Component Value Date   TSH 2.29 04/12/2015   Lab Results  Component Value Date   HGBA1C 5.9 04/12/2015   Lab Results  Component Value Date   CHOL  10/17/2010    112        ATP III CLASSIFICATION:  <200     mg/dL   Desirable  200-239  mg/dL   Borderline High  >=240    mg/dL   High          HDL 30* 10/17/2010   LDLCALC  10/17/2010    66        Total Cholesterol/HDL:CHD Risk Coronary Heart Disease Risk Table                     Men   Women  1/2 Average Risk   3.4   3.3  Average Risk       5.0   4.4  2 X Average Risk   9.6   7.1  3 X Average Risk  23.4   11.0        Use the calculated Patient Ratio above and the CHD Risk Table to determine the patient's CHD Risk.        ATP III CLASSIFICATION (LDL):  <100     mg/dL   Optimal  100-129  mg/dL   Near or Above                    Optimal  130-159  mg/dL   Borderline  160-189  mg/dL   High  >190     mg/dL   Very High   TRIG 81 10/17/2010   CHOLHDL 3.7 10/17/2010    Significant Diagnostic Results since last visit: none  Patient Care Team: Pamala Duffel, MD as PCP - General (Cardiology)  Assessment/Plan Problem List Items Addressed This Visit    Pain in left shoulder (Chronic)    Better worsened left shoulder pain on  chronic issue, X-ray L shoulder 05/10/15 showed no acute fx or dislocation, chronic rotate cuff pathology, conbtinue 1/2 Norco bid and Tylenol pr      Osteoarthritis of right knee    Hx of infected joint replacement, now s/p fusion surgery, continue indefinite use of Doxycycline 100mg  bid. No s/s of infection persently.       Major depressive disorder, recurrent episode, severe (HCC) (Chronic)    Mood has been stable, continue Cymbalta 20mg  since 02/09/15      Left lower lobe pneumonia    Cough, malaise, CXR 08/02/15 left basilar opacity, Rocephin 1gm IM bid x 10 days. Observe.  Fully treated, clinically healed. Observe.       Hypothyroidism (Chronic)    04/12/15 TSH 2.286 Continue  Levothyroxine 81mcg daily, update TSH      Essential hypertension (Chronic)    Controlled, continue Metoprolol 12.5mg  daily      Congestive heart failure (HCC) (Chronic)    Compensated clinically, risk for developing acute CHF related RVR, continue Furosemide 40mg  qd, KCL 56meq,  04/12/15 Bun/creat 24/1.18, observe the patient. 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 Update CBC, CMP, UA C/S, EKG, TSH  ManXie Mast NP [facility] [address], [place] [address] On Call:  [phone] & follow prompts after 5pm & weekends Office Phone:  [phone] Office Fax:  [phone]

## 2015-08-14 NOTE — Assessment & Plan Note (Signed)
Hx of infected joint replacement, now s/p fusion surgery, continue indefinite use of Doxycycline 100mg bid. No s/s of infection persently.  

## 2015-08-14 NOTE — Assessment & Plan Note (Signed)
Better worsened left shoulder pain on chronic issue, X-ray L shoulder 05/10/15 showed no acute fx or dislocation, chronic rotate cuff pathology, conbtinue 1/2 Norco bid and Tylenol pr

## 2015-08-14 NOTE — Assessment & Plan Note (Signed)
Mood has been stable, continue Cymbalta 20mg since 02/09/15 

## 2015-08-14 NOTE — Assessment & Plan Note (Signed)
Cough, malaise, CXR 08/02/15 left basilar opacity, Rocephin 1gm IM bid x 10 days. Observe.  Fully treated, clinically healed. Observe.

## 2015-08-16 LAB — HEPATIC FUNCTION PANEL
ALT: 17 U/L (ref 7–35)
AST: 27 U/L (ref 13–35)
Alkaline Phosphatase: 91 U/L (ref 25–125)
BILIRUBIN, TOTAL: 0.6 mg/dL

## 2015-08-16 LAB — BASIC METABOLIC PANEL
BUN: 21 mg/dL (ref 4–21)
Creatinine: 1.2 mg/dL — AB (ref 0.5–1.1)
GLUCOSE: 107 mg/dL
POTASSIUM: 3.9 mmol/L (ref 3.4–5.3)
Sodium: 142 mmol/L (ref 137–147)

## 2015-08-16 LAB — TSH: TSH: 2.71 u[IU]/mL (ref 0.41–5.90)

## 2015-08-16 LAB — CBC AND DIFFERENTIAL
HEMATOCRIT: 41 % (ref 36–46)
HEMOGLOBIN: 13.5 g/dL (ref 12.0–16.0)
Platelets: 73 10*3/uL — AB (ref 150–399)
WBC: 66.9 10*3/mL

## 2015-08-17 ENCOUNTER — Other Ambulatory Visit: Payer: Self-pay | Admitting: Nurse Practitioner

## 2015-08-17 DIAGNOSIS — N182 Chronic kidney disease, stage 2 (mild): Secondary | ICD-10-CM

## 2015-08-17 DIAGNOSIS — D696 Thrombocytopenia, unspecified: Secondary | ICD-10-CM

## 2015-08-17 DIAGNOSIS — C9130 Prolymphocytic leukemia of B-cell type not having achieved remission: Secondary | ICD-10-CM

## 2015-08-17 DIAGNOSIS — D509 Iron deficiency anemia, unspecified: Secondary | ICD-10-CM

## 2015-08-17 DIAGNOSIS — E039 Hypothyroidism, unspecified: Secondary | ICD-10-CM

## 2015-08-20 ENCOUNTER — Encounter: Payer: Self-pay | Admitting: Hematology & Oncology

## 2015-08-23 LAB — CBC AND DIFFERENTIAL
HEMATOCRIT: 38 % (ref 36–46)
Hemoglobin: 13 g/dL (ref 12.0–16.0)
Platelets: 59 10*3/uL — AB (ref 150–399)
WBC: 84.4 10^3/mL

## 2015-08-27 ENCOUNTER — Encounter: Payer: Self-pay | Admitting: Hematology & Oncology

## 2015-09-07 ENCOUNTER — Telehealth: Payer: Self-pay | Admitting: *Deleted

## 2015-09-07 ENCOUNTER — Telehealth: Payer: Self-pay | Admitting: Hematology & Oncology

## 2015-09-07 NOTE — Telephone Encounter (Signed)
Patient's daughter [name] called and stated that she wants to cancel her mother' appt witrh Dr. Marin Olp on 09/10/2015. [name] stated that she feels that her mother is in her last hours of life, and that she is too weak to come to this appt.       [facility].

## 2015-09-07 NOTE — Telephone Encounter (Signed)
Patient's daughter called to cancel appointments. She states patient appears to be actively dying. She would also like the weekly lab order at the nursing home to be dc'd.  Notified Dr Marin Olp and lab order dc'd at [facility] with Manuela Schwartz RN at [facility].

## 2015-09-10 ENCOUNTER — Other Ambulatory Visit: Payer: Medicare Other

## 2015-09-10 ENCOUNTER — Ambulatory Visit: Payer: Self-pay | Admitting: Hematology & Oncology

## 2015-09-11 ENCOUNTER — Encounter: Payer: Self-pay | Admitting: Nurse Practitioner

## 2015-09-11 ENCOUNTER — Non-Acute Institutional Stay (SKILLED_NURSING_FACILITY): Payer: Medicare Other | Admitting: Nurse Practitioner

## 2015-09-11 DIAGNOSIS — N182 Chronic kidney disease, stage 2 (mild): Secondary | ICD-10-CM | POA: Diagnosis not present

## 2015-09-11 DIAGNOSIS — R627 Adult failure to thrive: Secondary | ICD-10-CM | POA: Insufficient documentation

## 2015-09-11 DIAGNOSIS — J189 Pneumonia, unspecified organism: Secondary | ICD-10-CM | POA: Diagnosis not present

## 2015-09-11 DIAGNOSIS — M25512 Pain in left shoulder: Secondary | ICD-10-CM

## 2015-09-11 DIAGNOSIS — F332 Major depressive disorder, recurrent severe without psychotic features: Secondary | ICD-10-CM

## 2015-09-11 DIAGNOSIS — D509 Iron deficiency anemia, unspecified: Secondary | ICD-10-CM

## 2015-09-11 DIAGNOSIS — C9130 Prolymphocytic leukemia of B-cell type not having achieved remission: Secondary | ICD-10-CM | POA: Diagnosis not present

## 2015-09-11 DIAGNOSIS — E039 Hypothyroidism, unspecified: Secondary | ICD-10-CM | POA: Diagnosis not present

## 2015-09-11 DIAGNOSIS — M1711 Unilateral primary osteoarthritis, right knee: Secondary | ICD-10-CM | POA: Diagnosis not present

## 2015-09-11 DIAGNOSIS — I5020 Unspecified systolic (congestive) heart failure: Secondary | ICD-10-CM | POA: Diagnosis not present

## 2015-09-11 DIAGNOSIS — I10 Essential (primary) hypertension: Secondary | ICD-10-CM

## 2015-09-11 DIAGNOSIS — I480 Paroxysmal atrial fibrillation: Secondary | ICD-10-CM

## 2015-09-11 DIAGNOSIS — J181 Lobar pneumonia, unspecified organism: Secondary | ICD-10-CM

## 2015-09-11 DIAGNOSIS — D696 Thrombocytopenia, unspecified: Secondary | ICD-10-CM

## 2015-09-11 NOTE — Assessment & Plan Note (Signed)
Cough, malaise, CXR 08/02/15 left basilar opacity, Rocephin 1gm IM bid x 10 days. Observe.  Fully treated, clinically healed. Observe.

## 2015-09-11 NOTE — Assessment & Plan Note (Signed)
Hx of infected joint replacement, now s/p fusion surgery, continue indefinite use of Doxycycline 100mg  bid. No s/s of infection persently. Norco bid for multiple sites of osteoarthritic pains.

## 2015-09-11 NOTE — Assessment & Plan Note (Signed)
Comfort measures, continue Norco, Metoprolol, Cymbalta, Colace, prn Tylenol, doxy

## 2015-09-11 NOTE — Assessment & Plan Note (Signed)
Better worsened left shoulder pain on chronic issue, X-ray L shoulder 05/10/15 showed no acute fx or dislocation, chronic rotate cuff pathology, conbtinue 1/2 Norco bid and Tylenol pr

## 2015-09-11 NOTE — Assessment & Plan Note (Addendum)
Compensated clinically, risk for developing acute CHF related RVR, dc Furosemide 40mg  qd, KCL 62meq, creat 1.0-1.5. Limited oral intake, resolved BLE edema.

## 2015-09-11 NOTE — Assessment & Plan Note (Signed)
03/08/15 Hematology: IV iron, 03/19/15 wbc 39.4, cbc wkly, stopped coumadin 06/25/15 wbc 42, Hgb 11.8, plt 99 07/24/15 wbc 54.8, Hgb 13.2, plt 16.5 08/16/15 wbc 66.9, Hgb 13.5, plt 73 08/23/15 wbc 84.4, Hgb 13.0, plt 59

## 2015-09-11 NOTE — Progress Notes (Signed)
Patient ID: [name], female   DOB: 1915-04-16, 82 y.o.   MRN: [id number]  Location:  SNF [facility] Provider:  Marlana Latus NP  Code Status:  DNR Goals of care: Advanced Directive information Does patient have an advance directive?: Yes, Type of Advance Directive: [place];Out of facility DNR (pink MOST or yellow form)  Chief Complaint  Patient presents with  . Medical Management of Chronic Issues    Routine Visit  . Acute Visit    debilitating      HPI: Patient is a 82 y.o. female seen in the SNF at [facility] today for evaluation of debility, poor oral intake, increased generalized weakness, in her usual mentation, afebrile, no respiratory distress, HR <100, Hx of HTN, Afib, depression/anxiety, osteoarthritic pain.   Review of Systems:  Review of Systems  Constitutional: Positive for malaise/fatigue. Negative for fever and diaphoresis.       Generalized  HENT: Positive for hearing loss. Negative for congestion, ear pain and nosebleeds.   Eyes: Negative for pain, discharge and redness.  Respiratory: Negative for cough, shortness of breath and wheezing.   Cardiovascular: Positive for palpitations. Negative for chest pain and leg swelling.       Resolved C/o palpitation, HR 112, denied chest pain or pressure.   Gastrointestinal: Negative for nausea, vomiting, diarrhea and constipation.  Genitourinary: Positive for frequency. Negative for dysuria, urgency, hematuria and flank pain.  Musculoskeletal: Positive for joint pain. Negative for myalgias, back pain and neck pain.       Shoulder pain L>R  Skin: Negative for rash.       Chronic venous insufficiency BLE  Neurological: Positive for weakness. Negative for dizziness, tremors, seizures and headaches.  Endo/Heme/Allergies: Does not bruise/bleed easily.  Psychiatric/Behavioral: Positive for memory loss. Negative for suicidal ideas and hallucinations. The patient is nervous/anxious.        Forgetful     Past Medical History  Diagnosis Date  . Anemia   . Arthritis   . CHF (congestive heart failure) ([place])   . Depression   . Hypertension   . Chronic kidney disease   . Thyroid disease     Patient Active Problem List   Diagnosis Date Noted  . Adult failure to thrive 09/11/2015  . Left lower lobe pneumonia 08/03/2015  . Thrombocytopenia ([place]) 07/02/2015  . Pain in left shoulder 04/09/2015  . 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 Pain in right hip 10/19/2014  . Allergic rhinitis 05/16/2014  . Hypothyroidism 05/11/2014  . UTI (urinary tract infection) 05/11/2014  . Chronic UTI (urinary tract infection) 05/11/2014  . Fall 07/01/2013  . Constipation 05/31/2013  . Closed wedge compression fracture of T7 vertebra 03/08/2013  . CKD (chronic kidney disease), stage II 03/08/2013  . Congestive heart failure ([place]) 10/29/2012  . Osteoarthritis of right knee 10/29/2012  . Iron deficiency anemia 10/22/2012  . Major depressive disorder, recurrent episode, severe ([place]) 10/22/2012  . Essential hypertension 10/22/2012  . Other primary cardiomyopathies 10/22/2012  . Atrial fibrillation ([place]) 10/22/2012  . Shortness of breath 10/22/2012    Allergies  Allergen Reactions  . Amiodarone   . Penicillins   . Amoxicillin   . Celecoxib   . Ciprofloxacin   . Codeine   . Diltiazem   . Lactobacillus [Acidophilus Lactobacillus]   . Metoprolol Succinate     All Beta-blockers  . Metronidazole Hcl   . Other     floricene dye netribudazike    Medications: Patient's Medications  New  Prescriptions   No medications on file  Previous Medications   ACETAMINOPHEN (TYLENOL) 325 MG TABLET    Take 325 mg by mouth every 4 (four) hours as needed.    CAMPHOR-EUCALYPTUS-MENTHOL (VICKS VAPORUB) 4.7-1.2-2.6 % OINT    Apply topically. Rub on chest as directed  (may keep at bedside)   DOCUSATE SODIUM (COLACE) 100 MG CAPSULE    Take 100 mg by mouth 2 (two) times daily.     DOXYCYCLINE  (VIBRAMYCIN) 100 MG CAPSULE    Take 100 mg by mouth 2 (two) times daily.   DULOXETINE (CYMBALTA) 20 MG CAPSULE    Take 20 mg by mouth daily.    FUROSEMIDE (LASIX) 40 MG TABLET    Take 20 mg by mouth daily.    HYDROCODONE-ACETAMINOPHEN (NORCO/VICODIN) 5-325 MG PER TABLET    Take 1/2 tablet by mouth twice daily   IPRATROPIUM-ALBUTEROL (DUONEB) 0.5-2.5 (3) MG/3ML SOLN    Take 3 mLs by nebulization every 6 (six) hours as needed.   LEVOTHYROXINE (SYNTHROID, LEVOTHROID) 75 MCG TABLET    Take 75 mcg by mouth daily before breakfast.    MAGNESIUM OXIDE (MAG-OX) 400 MG TABLET    Take 400 mg by mouth daily.     METOPROLOL TARTRATE (LOPRESSOR) 25 MG TABLET    Take 12.5 mg by mouth 2 (two) times daily.   POTASSIUM CHLORIDE (MICRO-K) 10 MEQ CR CAPSULE    Take 20 mEq by mouth daily. Takes 20 meq (2) 10 meq daily  Modified Medications   No medications on file  Discontinued Medications   ACETAMINOPHEN (TYLENOL) 500 MG TABLET    Take 1 caplet by mouth twice daily   BETA CAROTENE W/MINERALS (OCUVITE) TABLET    Take 1 tablet by mouth daily. Reported on 09/11/2015   CALCIUM-VITAMIN D (OSCAL WITH D) 500-200 MG-UNIT PER TABLET    Take 1 tablet by mouth 2 (two) times daily. Reported on [id number]   FOLIC ACID (FOLVITE) 1 MG TABLET    Take 1 mg by mouth daily. Reported on 09/11/2015   METOPROLOL SUCCINATE (TOPROL-XL) 25 MG 24 HR TABLET    Take 25 mg by mouth daily. Taking 1/2 tab    MULTIPLE VITAMIN (MULTIVITAMIN) TABLET    Take 1 tablet by mouth daily.     SENNOSIDES-DOCUSATE SODIUM (SENOKOT-S) 8.6-50 MG TABLET    Take by mouth daily.      Physical Exam: Filed Vitals:   09/11/15 0907  BP: 138/84  Pulse: 118  Temp: 98.4 F (36.9 C)  TempSrc: Oral  Resp: 20  Height: 5' 4.5" (1.638 m)  Weight: 154 lb (69.854 kg)  SpO2: 94%   Body mass index is 26.04 kg/(m^2).  Physical Exam  Constitutional: She is oriented to person, place, and time. She appears well-developed and well-nourished. No distress.  HENT:  Head:  Normocephalic and atraumatic.  Eyes: Conjunctivae and EOM are normal. Pupils are equal, round, and reactive to light.  Neck: Normal range of motion. Neck supple. No JVD present. No thyromegaly present.  Cardiovascular: Normal rate.  Frequent extrasystoles are present.  Murmur heard. Pulses:      Dorsalis pedis pulses are 2+ on the right side, and 1+ on the left side.  EM 2/6C/o palpitation, HR 112, denied chest pain or pressure.    Pulmonary/Chest: She has no wheezes. She has rales. She exhibits no tenderness.  Abdominal: Soft. Bowel sounds are normal. She exhibits no distension. There is no tenderness. There is no guarding.  Musculoskeletal: She exhibits tenderness. She exhibits  no edema (left knee).       Left shoulder: She exhibits decreased range of motion, tenderness, crepitus and pain.       Legs: Anterior distal femur 2x2cm firm nodule palpated, fixed, hard, non tender. Reduced ROM of the R+L shoulder for over head activities. Left wrist/left shoulder/left knee pain on and off. Crepitus to L shoulder, endorses paint with ROM. Chronic R knee pain.  Lymphadenopathy:    She has no cervical adenopathy.  Neurological: She is alert and oriented to person, place, and time. She has normal reflexes. No cranial nerve deficit. She exhibits normal muscle tone. Coordination normal.  Skin: Skin is dry. No rash noted. She is not diaphoretic. There is erythema (left knee).  baseline of mild heat of the left knee. Dark pigmented anterior RLE.   Psychiatric: She has a normal mood and affect. Her behavior is normal. Judgment and thought content normal. Cognition and memory are impaired. She exhibits abnormal recent memory.    Labs reviewed: Basic Metabolic Panel:  Recent Labs  10/23/14 04/12/15 08/16/15  NA 139 141 142  K 4.5 4.4 3.9  BUN  --  24* 21  CREATININE 1.1 1.2* 1.2*    Liver Function Tests:  Recent Labs  04/12/15 08/16/15  AST 20 27  ALT 11 17  ALKPHOS 66 91     CBC:  Recent Labs  03/07/15 1314  04/18/15 1335  07/24/15 08/16/15 08/23/15  WBC 44.6*  < > 46.8*  < > 54.8 66.9 84.4  HGB 12.0  < > 12.1  < > 13.2 13.5 13.0  HCT 38.1  < > 38.8  < > 40 41 38  MCV 95  --  94  --   --   --   --   PLT 82*  < > 88*  < > 95* 73* 59*  < > = values in this interval not displayed.  Lab Results  Component Value Date   TSH 2.71 08/16/2015   Lab Results  Component Value Date   HGBA1C 5.9 04/12/2015   Lab Results  Component Value Date   CHOL  10/17/2010    112        ATP III CLASSIFICATION:  <200     mg/dL   Desirable  200-239  mg/dL   Borderline High  >=240    mg/dL   High          HDL 30* 10/17/2010   LDLCALC  10/17/2010    66        Total Cholesterol/HDL:CHD Risk Coronary Heart Disease Risk Table                     Men   Women  1/2 Average Risk   3.4   3.3  Average Risk       5.0   4.4  2 X Average Risk   9.6   7.1  3 X Average Risk  23.4   11.0        Use the calculated Patient Ratio above and the CHD Risk Table to determine the patient's CHD Risk.        ATP III CLASSIFICATION (LDL):  <100     mg/dL   Optimal  100-129  mg/dL   Near or Above                    Optimal  130-159  mg/dL   Borderline  160-189  mg/dL   High  >190  mg/dL   Very High   TRIG 81 10/17/2010   CHOLHDL 3.7 10/17/2010    Significant Diagnostic Results since last visit: none  Patient Care Team: Pamala Duffel, MD as PCP - General (Cardiology)  Assessment/Plan Problem List Items Addressed This Visit    Iron deficiency anemia (Chronic)    08/16/15 Hgb 13.5, 02/04/14 Iron 19, Hgb 11.9, normal TIBC 228 and ferritin 150--suggestive anemia of chronic disease.         Major depressive disorder, recurrent episode, severe (HCC) (Chronic)    Mood has been stable, continue Cymbalta 20mg  since 02/09/15      Essential hypertension (Chronic)    Controlled, continue Metoprolol 12.5mg  bid      Relevant Medications   metoprolol tartrate (LOPRESSOR) 25 MG  tablet   Atrial fibrillation (HCC) - Primary (Chronic)    Heart rate is in control, continue Metoprolol 12.5mg  to bid      Relevant Medications   metoprolol tartrate (LOPRESSOR) 25 MG tablet   Congestive heart failure (HCC) (Chronic)    Compensated clinically, risk for developing acute CHF related RVR, dc Furosemide 40mg  qd, KCL 29meq, creat 1.0-1.5. Limited oral intake, resolved BLE edema.       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[place]) (Chronic)    03/08/15 Hematology: IV iron, 03/19/15 wbc 39.4, cbc wkly, stopped coumadin 06/25/15 wbc 42, Hgb 11.8, plt 99 07/24/15 wbc 54.8, Hgb 13.2, plt 16.5 08/16/15 wbc 66.9, Hgb 13.5, plt 73 08/23/15 wbc 84.4, Hgb 13.0, plt 59         Pain in left shoulder (Chronic)    Better worsened left shoulder pain on chronic issue, X-ray L shoulder 05/10/15 showed no acute fx or dislocation, chronic rotate cuff pathology, conbtinue 1/2 Norco bid and Tylenol pr      Thrombocytopenia (HCC) (Chronic)    Present as part of her prolymphocytic leukemia 08/16/15 Plt 73      Osteoarthritis of right knee    Hx of infected joint replacement, now s/p fusion surgery, continue indefinite use of Doxycycline 100mg  bid. No s/s of infection persently. Norco bid for multiple sites of osteoarthritic pains.       Left lower lobe pneumonia    Cough, malaise, CXR 08/02/15 left basilar opacity, Rocephin 1gm IM bid x 10 days. Observe.  Fully treated, clinically healed. Observe.      Relevant Medications   ipratropium-albuterol (DUONEB) 0.5-2.5 (3) MG/3ML SOLN   Adult failure to thrive     Comfort measures, continue Norco, Metoprolol, Cymbalta, Colace, prn Tylenol, doxy          Family/ staff Communication: continue comfort measures.   Labs/tests ordered:  none  ManXie Ameira Alessandrini NP [facility] [address], [place] [address] On Call:  [phone] & follow prompts after 5pm & weekends Office Phone:  [phone] Office Fax:  [phone]

## 2015-09-11 NOTE — Assessment & Plan Note (Signed)
Controlled, continue Metoprolol 12.5mg bid.  

## 2015-09-11 NOTE — Assessment & Plan Note (Signed)
08/16/15 Hgb 13.5, 02/04/14 Iron 19, Hgb 11.9, normal TIBC 228 and ferritin 150--suggestive anemia of chronic disease.

## 2015-09-11 NOTE — Assessment & Plan Note (Signed)
Mood has been stable, continue Cymbalta 20mg since 02/09/15 

## 2015-09-11 NOTE — Assessment & Plan Note (Signed)
Heart rate is in control, continue Metoprolol 12.5mg  to bid

## 2015-09-11 NOTE — Assessment & Plan Note (Signed)
01/05/14 Bun/creat 39/1.18 04/12/15 Bun/creat 24/1.18 08/16/15 Bun 21, creat 1.22

## 2015-09-11 NOTE — Assessment & Plan Note (Signed)
Present as part of her prolymphocytic leukemia 08/16/15 Plt 73

## 2015-09-11 NOTE — Assessment & Plan Note (Signed)
04/12/15 TSH 2.286, 08/16/15 TSH 2.714 Continue Levothyroxine 90mcg daily

## 2015-10-27 DEATH — deceased
